# Patient Record
Sex: Female | Born: 1990 | Race: Black or African American | Hispanic: No | Marital: Single | Smoking: Never smoker
Health system: Southern US, Community
[De-identification: ages and names within clinical notes are randomized; demographics above are authoritative.]

## PROBLEM LIST (undated history)

## (undated) DIAGNOSIS — F32A Depression, unspecified: Secondary | ICD-10-CM

## (undated) DIAGNOSIS — R51 Headache: Secondary | ICD-10-CM

## (undated) DIAGNOSIS — I10 Essential (primary) hypertension: Secondary | ICD-10-CM

## (undated) DIAGNOSIS — F329 Major depressive disorder, single episode, unspecified: Secondary | ICD-10-CM

## (undated) DIAGNOSIS — A64 Unspecified sexually transmitted disease: Secondary | ICD-10-CM

## (undated) DIAGNOSIS — F319 Bipolar disorder, unspecified: Secondary | ICD-10-CM

## (undated) DIAGNOSIS — F209 Schizophrenia, unspecified: Secondary | ICD-10-CM

## (undated) HISTORY — PX: BUNIONECTOMY: SHX129

---
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
## 2011-03-25 LAB — TSH: TSH: 1.88

## 2011-03-25 LAB — T4, FREE: Free T4: 1.27

## 2011-03-25 LAB — RPR: RPR Ser Ql: NONREACTIVE

## 2011-03-25 LAB — GC/CHLAMYDIA PROBE AMP, URINE: Chlamydia, Swab/Urine, PCR: NEGATIVE

## 2011-03-25 LAB — GAMMA GT: GGT: 16

## 2012-09-10 ENCOUNTER — Emergency Department (HOSPITAL_COMMUNITY)
Admission: EM | Admit: 2012-09-10 | Discharge: 2012-09-10 | Disposition: A | Discharge status: Home / Self Care | Payer: Self-pay | Attending: Emergency Medicine | Admitting: Emergency Medicine

## 2012-09-10 DIAGNOSIS — M545 Low back pain, unspecified: Secondary | ICD-10-CM | POA: Insufficient documentation

## 2012-09-10 DIAGNOSIS — N644 Mastodynia: Secondary | ICD-10-CM | POA: Insufficient documentation

## 2012-09-10 DIAGNOSIS — D2330 Other benign neoplasm of skin of unspecified part of face: Secondary | ICD-10-CM | POA: Insufficient documentation

## 2012-09-10 DIAGNOSIS — M549 Dorsalgia, unspecified: Secondary | ICD-10-CM

## 2012-09-10 DIAGNOSIS — N61 Mastitis without abscess: Secondary | ICD-10-CM | POA: Insufficient documentation

## 2012-09-10 DIAGNOSIS — D229 Melanocytic nevi, unspecified: Secondary | ICD-10-CM

## 2012-09-10 DIAGNOSIS — L0390 Cellulitis, unspecified: Secondary | ICD-10-CM

## 2012-09-10 MED ORDER — TRAMADOL HCL 50 MG PO TABS: 50.0000 mg | ORAL_TABLET | Freq: Four times a day (QID) | ORAL | Status: DC | PRN

## 2012-09-10 MED ORDER — TRAMADOL HCL 50 MG PO TABS
50.0000 mg | ORAL_TABLET | Freq: Once | ORAL | Status: AC
  Administered 2012-09-10: 50 mg via ORAL
  Filled 2012-09-10: qty 1

## 2012-09-10 MED ORDER — CEPHALEXIN 500 MG PO CAPS: 500.0000 mg | ORAL_CAPSULE | Freq: Four times a day (QID) | ORAL | Status: DC

## 2012-09-10 NOTE — ED Notes (Signed)
Pt states her LMP was about 2 or 3 months ago. States she has irregular periods and doesn't think she is pregnant.

## 2012-09-10 NOTE — ED Notes (Signed)
Pt verbalized understanding of d/c instructions. Pt medicated for pain prior to d/c. Pt ambulatory to d/c window with steady gait.

## 2012-09-10 NOTE — ED Provider Notes (Signed)
History    This chart was scribed for non-physician practitioner working with Raeford Razor, MD by Smitty Pluck, ED scribe. This patient was seen in room WTR7/WTR7 and the patient's care was started at 9:44 PM.   CSN: [id number]  Arrival date & time 09/10/12  2044       Chief Complaint  Patient presents with  . Breast Pain    The history is provided by the patient. No language interpreter was used.   Kathleen [name] is a 22 y.o. female who presents to the Emergency Department complaining of constant, moderate breast pain onset 1 month ago that is gradually worsening. She states the pain is located underneath her breast. She states she has discharge from both nipples when she squeezes her nipples really hard that has been ongoing since onset of breast pain. Pt mentions that she has associated back pain due to her breast increasing in size. [facility] and another one 1 month ago and both were negative. She reports having recurrent facial moles that have been treated by dermatologist in the past. She denies facial pain. Pt denies fever, chills, nausea, vomiting, diarrhea, weakness, cough, SOB and any other pain.   No past medical history on file.  No past surgical history on file.  No family history on file.  History  Substance Use Topics  . Smoking status: Not on file  . Smokeless tobacco: Not on file  . Alcohol Use: Not on file    OB History   No data available      Review of Systems  Constitutional: Negative for fever and chills.  Respiratory: Negative for cough and shortness of breath.   Gastrointestinal: Negative for nausea, vomiting and diarrhea.  Musculoskeletal: Positive for back pain.  Neurological: Negative for weakness.    Allergies  Review of patient's allergies indicates no known allergies.  Home Medications   Current Outpatient Rx  Name  Route  Sig  Dispense  Refill  . aspirin 325 MG tablet   Oral   Take 325 mg  by mouth every 6 (six) hours as needed for pain.         . cephALEXin (KEFLEX) 500 MG capsule   Oral   Take 1 capsule (500 mg total) by mouth 4 (four) times daily.   28 capsule   0   . traMADol (ULTRAM) 50 MG tablet   Oral   Take 1 tablet (50 mg total) by mouth every 6 (six) hours as needed for pain.   15 tablet   0     BP 133/83  Pulse 95  Temp(Src) 98.4 F (36.9 C) (Oral)  Resp 17  SpO2 99%  Physical Exam  Nursing note and vitals reviewed. Constitutional: She is oriented to person, place, and time. She appears well-developed and well-nourished. No distress.  HENT:  Head: Normocephalic and atraumatic.    Eyes: EOM are normal.  Neck: Neck supple. No tracheal deviation present.  Cardiovascular: Normal rate.   Pulmonary/Chest: Effort normal. No respiratory distress.    Musculoskeletal: Normal range of motion.       Lumbar back: She exhibits pain and spasm.       Back:  Neurological: She is alert and oriented to person, place, and time.  Skin: Skin is warm and dry.  Psychiatric: She has a normal mood and affect. Her behavior is normal.    ED Course  Procedures (including critical care time) DIAGNOSTIC STUDIES: Oxygen Saturation is  99% on room air, normal by my interpretation.    COORDINATION OF CARE: 9:48 PM Discussed ED treatment with pt and pt agrees.     Labs Reviewed - No data to display No results found.   1. Back pain   2. Mole of skin   3. Cellulitis   4. Breast pain in female       MDM  Pt has very large breast which she describes as growing again very fast. Due to growing breasts, breat pain and itching, clear discharge patients hormones probably need to be checked. [facility] which is normal and declines a second here today. She has had back pains and breast pains for months now. [facility] with a vaginal yeast infection yesterday but did not tell them about her facial  moles, breast pain/discharge or back pain.   Referral to Dr. Izora Ribas given for evaluation of possible breast reduction Dermatology referral given for facial moles Gynecology referral given for possible hormone imbalance.  Rx. Bactrim for cellulitis and Ultram for pain  Pt has been advised of the symptoms that warrant their return to the ED. Patient has voiced understanding and has agreed to follow-up with the PCP or specialist.  I personally performed the services described in this documentation, which was scribed in my presence. The recorded information has been reviewed and is accurate.    Dorthula Matas, PA-C 09/10/12 2210

## 2012-09-10 NOTE — ED Notes (Signed)
Pt states she has some bumps to her face. Pt has two papules to her face that she has had treated by a dermatologist in the past. Pt states she also has used OTC "freeze-off" to remove the bumps, but they are still there. Pt also c/o pain under both breasts with white discharge from her nipples. Pt states this has been going on for a month. Pt states breasts are heavy and painful. Pt ambulatory to exam room with a steady gait. Pt states she has a ride home.

## 2012-09-15 NOTE — ED Provider Notes (Signed)
Medical screening examination/treatment/procedure(s) were performed by non-physician practitioner and as supervising physician I was immediately available for consultation/collaboration.  Kenasia Scheller, MD 09/15/12 1302 

## 2012-09-24 ENCOUNTER — Encounter (HOSPITAL_COMMUNITY): Payer: Self-pay | Admitting: Emergency Medicine

## 2012-09-24 ENCOUNTER — Emergency Department (HOSPITAL_COMMUNITY): Payer: No Typology Code available for payment source

## 2012-09-24 ENCOUNTER — Emergency Department (HOSPITAL_COMMUNITY)
Admission: EM | Admit: 2012-09-24 | Discharge: 2012-09-24 | Disposition: A | Discharge status: Home / Self Care | Payer: No Typology Code available for payment source | Attending: Emergency Medicine | Admitting: Emergency Medicine

## 2012-09-24 DIAGNOSIS — S139XXA Sprain of joints and ligaments of unspecified parts of neck, initial encounter: Secondary | ICD-10-CM | POA: Insufficient documentation

## 2012-09-24 DIAGNOSIS — S161XXA Strain of muscle, fascia and tendon at neck level, initial encounter: Secondary | ICD-10-CM

## 2012-09-24 DIAGNOSIS — Y9389 Activity, other specified: Secondary | ICD-10-CM | POA: Insufficient documentation

## 2012-09-24 DIAGNOSIS — S335XXA Sprain of ligaments of lumbar spine, initial encounter: Secondary | ICD-10-CM | POA: Insufficient documentation

## 2012-09-24 DIAGNOSIS — Y92410 Unspecified street and highway as the place of occurrence of the external cause: Secondary | ICD-10-CM | POA: Insufficient documentation

## 2012-09-24 DIAGNOSIS — S39012A Strain of muscle, fascia and tendon of lower back, initial encounter: Secondary | ICD-10-CM

## 2012-09-24 MED ORDER — IBUPROFEN 800 MG PO TABS: 800.0000 mg | ORAL_TABLET | Freq: Three times a day (TID) | ORAL | Status: DC | PRN

## 2012-09-24 MED ORDER — HYDROCODONE-ACETAMINOPHEN 5-325 MG PO TABS: 1.0000 | ORAL_TABLET | Freq: Four times a day (QID) | ORAL | Status: DC | PRN
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
## 2012-09-24 NOTE — ED Provider Notes (Signed)
Medical screening examination/treatment/procedure(s) were performed by non-physician practitioner and as supervising physician I was immediately available for consultation/collaboration.  Juliet Rude. Rubin Payor, MD 09/24/12 2142
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
## 2012-10-13 ENCOUNTER — Emergency Department (HOSPITAL_COMMUNITY)
Admission: EM | Admit: 2012-10-13 | Discharge: 2012-10-13 | Disposition: A | Discharge status: Home / Self Care | Payer: Self-pay | Attending: Emergency Medicine | Admitting: Emergency Medicine

## 2012-10-13 ENCOUNTER — Encounter (HOSPITAL_COMMUNITY): Payer: Self-pay | Admitting: *Deleted

## 2012-10-13 DIAGNOSIS — R112 Nausea with vomiting, unspecified: Secondary | ICD-10-CM | POA: Insufficient documentation

## 2012-10-13 DIAGNOSIS — K5289 Other specified noninfective gastroenteritis and colitis: Secondary | ICD-10-CM | POA: Insufficient documentation

## 2012-10-13 DIAGNOSIS — K529 Noninfective gastroenteritis and colitis, unspecified: Secondary | ICD-10-CM

## 2012-10-13 DIAGNOSIS — R109 Unspecified abdominal pain: Secondary | ICD-10-CM | POA: Insufficient documentation

## 2012-10-13 DIAGNOSIS — Z3202 Encounter for pregnancy test, result negative: Secondary | ICD-10-CM | POA: Insufficient documentation

## 2012-10-13 DIAGNOSIS — R197 Diarrhea, unspecified: Secondary | ICD-10-CM | POA: Insufficient documentation

## 2012-10-13 LAB — COMPREHENSIVE METABOLIC PANEL
AST: 29 U/L (ref 0–37)
Albumin: 3.8 g/dL (ref 3.5–5.2)
Calcium: 9.3 mg/dL (ref 8.4–10.5)
Chloride: 102 mEq/L (ref 96–112)
Creatinine, Ser: 0.99 mg/dL (ref 0.50–1.10)
Sodium: 139 mEq/L (ref 135–145)
Total Bilirubin: 0.2 mg/dL — ABNORMAL LOW (ref 0.3–1.2)

## 2012-10-13 LAB — CBC WITH DIFFERENTIAL/PLATELET
Basophils Absolute: 0 10*3/uL (ref 0.0–0.1)
Basophils Relative: 0 % (ref 0–1)
HCT: 41.8 % (ref 36.0–46.0)
MCHC: 33 g/dL (ref 30.0–36.0)
Monocytes Absolute: 0.6 10*3/uL (ref 0.1–1.0)
Neutro Abs: 3.9 10*3/uL (ref 1.7–7.7)
Platelets: 209 10*3/uL (ref 150–400)
RDW: 12.4 % (ref 11.5–15.5)

## 2012-10-13 LAB — URINALYSIS, ROUTINE W REFLEX MICROSCOPIC
Glucose, UA: NEGATIVE mg/dL
Specific Gravity, Urine: 1.032 — ABNORMAL HIGH (ref 1.005–1.030)

## 2012-10-13 LAB — URINE MICROSCOPIC-ADD ON

## 2012-10-13 LAB — POCT PREGNANCY, URINE: Preg Test, Ur: NEGATIVE

## 2012-10-13 MED ORDER — ONDANSETRON HCL 8 MG PO TABS: 8.0000 mg | ORAL_TABLET | Freq: Three times a day (TID) | ORAL | Status: DC | PRN

## 2012-10-13 MED ORDER — ONDANSETRON HCL 4 MG/2ML IJ SOLN
4.0000 mg | Freq: Once | INTRAMUSCULAR | Status: AC
  Administered 2012-10-13: 4 mg via INTRAVENOUS
  Filled 2012-10-13: qty 2

## 2012-10-13 MED ORDER — SODIUM CHLORIDE 0.9 % IV BOLUS (SEPSIS)
1000.0000 mL | Freq: Once | INTRAVENOUS | Status: AC
  Administered 2012-10-13: 1000 mL via INTRAVENOUS

## 2012-10-13 NOTE — ED Provider Notes (Signed)
History     CSN: [id number]  Arrival date & time 10/13/12  1236   First MD Initiated Contact with Patient 10/13/12 1311      Chief Complaint  Patient presents with  . Emesis  . Diarrhea    (Consider location/radiation/quality/duration/timing/severity/associated sxs/prior treatment) Patient is a 22 y.o. female presenting with vomiting and diarrhea. The history is provided by the patient.  Emesis Associated symptoms: diarrhea   Associated symptoms: no headaches   Diarrhea Associated symptoms: vomiting   Associated symptoms: no fever and no headaches   pt with nvd x past 1-2 days. States thinks had started after eating some ice cream, but also states sign other recently ill w same symptoms. Emesis, clear or color of recently ingested liquids, not bloody or bilious. Diarrhea watery. Notes several episodes per day. No bloody stools. Intermittent mid abd crampy pain, no constant or focal abd pain. Urinating regularly, no gu c/o. No back or flank pain. No vaginal bleeding or discharge. States last period a few weeks ago. No recent travel. No recent abx use. No fever or chills. No faintness or dizziness.   History reviewed. No pertinent past medical history.  History reviewed. No pertinent past surgical history.  History reviewed. No pertinent family history.  History  Substance Use Topics  . Smoking status: Not on file  . Smokeless tobacco: Not on file  . Alcohol Use: No    OB History   Grav Para Term Preterm Abortions TAB SAB Ect Mult Living                  Review of Systems  Constitutional: Negative for fever.  HENT: Negative for neck pain.   Eyes: Negative for redness.  Respiratory: Negative for shortness of breath.   Cardiovascular: Negative for chest pain.  Gastrointestinal: Positive for vomiting and diarrhea.  Genitourinary: Negative for dysuria and flank pain.  Musculoskeletal: Negative for back pain.  Skin: Negative for rash.  Neurological: Negative for  headaches.  Hematological: Does not bruise/bleed easily.  Psychiatric/Behavioral: Negative for confusion.    Allergies  Review of patient's allergies indicates no known allergies.  Home Medications  No current outpatient prescriptions on file.  BP 118/80  Pulse 93  Temp(Src) 99.1 F (37.3 C) (Oral)  Resp 23  SpO2 94%  LMP 06/26/2012  Physical Exam  Nursing note and vitals reviewed. Constitutional: She appears well-developed and well-nourished. No distress.  HENT:  Mouth/Throat: Oropharynx is clear and moist.  Eyes: Conjunctivae are normal. No scleral icterus.  Neck: Neck supple. No tracheal deviation present.  Cardiovascular: Normal rate, regular rhythm, normal heart sounds and intact distal pulses.   Pulmonary/Chest: Effort normal and breath sounds normal. No respiratory distress.  Abdominal: Soft. Normal appearance and bowel sounds are normal. She exhibits no distension and no mass. There is no tenderness. There is no rebound and no guarding.  Genitourinary:  No cva tenderness  Musculoskeletal: She exhibits no edema.  Neurological: She is alert.  Skin: Skin is warm and dry. No rash noted.  Psychiatric: She has a normal mood and affect.    ED Course  Procedures (including critical care time)   Results for orders placed during the hospital encounter of 10/13/12  CBC WITH DIFFERENTIAL      Result Value Range   WBC 5.9  4.0 - 10.5 K/uL   RBC 5.19 (*) 3.87 - 5.11 MIL/uL   Hemoglobin 13.8  12.0 - 15.0 g/dL   HCT 62.1  30.8 - 65.7 %  MCV 80.5  78.0 - 100.0 fL   MCH 26.6  26.0 - 34.0 pg   MCHC 33.0  30.0 - 36.0 g/dL   RDW 57.8  46.9 - 62.9 %   Platelets 209  150 - 400 K/uL   Neutrophils Relative 65  43 - 77 %   Neutro Abs 3.9  1.7 - 7.7 K/uL   Lymphocytes Relative 24  12 - 46 %   Lymphs Abs 1.5  0.7 - 4.0 K/uL   Monocytes Relative 10  3 - 12 %   Monocytes Absolute 0.6  0.1 - 1.0 K/uL   Eosinophils Relative 0  0 - 5 %   Eosinophils Absolute 0.0  0.0 - 0.7 K/uL    Basophils Relative 0  0 - 1 %   Basophils Absolute 0.0  0.0 - 0.1 K/uL  COMPREHENSIVE METABOLIC PANEL      Result Value Range   Sodium 139  135 - 145 mEq/L   Potassium 3.6  3.5 - 5.1 mEq/L   Chloride 102  96 - 112 mEq/L   CO2 28  19 - 32 mEq/L   Glucose, Bld 93  70 - 99 mg/dL   BUN 6  6 - 23 mg/dL   Creatinine, Ser 5.28  0.50 - 1.10 mg/dL   Calcium 9.3  8.4 - 41.3 mg/dL   Total Protein 8.0  6.0 - 8.3 g/dL   Albumin 3.8  3.5 - 5.2 g/dL   AST 29  0 - 37 U/L   ALT 32  0 - 35 U/L   Alkaline Phosphatase 74  39 - 117 U/L   Total Bilirubin 0.2 (*) 0.3 - 1.2 mg/dL   GFR calc non Af Amer 80 (*) >90 mL/min   GFR calc Af Amer >90  >90 mL/min  URINALYSIS, ROUTINE W REFLEX MICROSCOPIC      Result Value Range   Color, Urine Verena (*) YELLOW   APPearance CLOUDY (*) CLEAR   Specific Gravity, Urine 1.032 (*) 1.005 - 1.030   pH 6.0  5.0 - 8.0   Glucose, UA NEGATIVE  NEGATIVE mg/dL   Hgb urine dipstick LARGE (*) NEGATIVE   Bilirubin Urine SMALL (*) NEGATIVE   Ketones, ur NEGATIVE  NEGATIVE mg/dL   Protein, ur 30 (*) NEGATIVE mg/dL   Urobilinogen, UA 1.0  0.0 - 1.0 mg/dL   Nitrite NEGATIVE  NEGATIVE   Leukocytes, UA SMALL (*) NEGATIVE  URINE MICROSCOPIC-ADD ON      Result Value Range   Squamous Epithelial / LPF MANY (*) RARE   WBC, UA 3-6  <3 WBC/hpf   RBC / HPF 3-6  <3 RBC/hpf   Bacteria, UA FEW (*) RARE   Urine-Other MUCOUS PRESENT    POCT PREGNANCY, URINE      Result Value Range   Preg Test, Ur NEGATIVE  NEGATIVE       MDM  Iv ns bolus. zofran iv.  Reviewed nursing notes and prior charts for additional history.   Recheck pt, no nv. Tolerating po.   No recurrent nvd while in ed.  abd  Soft nt.   Pt appears stable for d/c.         Suzi Roots, MD 10/13/12 1501

## 2012-10-13 NOTE — ED Notes (Signed)
Pt states "My grandmother thinks I have food poisoning so she talked me into coming here." reports diarrhea and vomiting "every hour on the hour." pt also c/o abdominal pain. reports seeing blood in stool.

## 2012-10-14 LAB — URINE CULTURE: Colony Count: >=100000

## 2012-11-06 ENCOUNTER — Emergency Department (HOSPITAL_COMMUNITY)
Admission: EM | Admit: 2012-11-06 | Discharge: 2012-11-06 | Disposition: A | Discharge status: Other Acute Inpatient Hospital | Payer: Self-pay | Attending: Emergency Medicine | Admitting: Emergency Medicine

## 2012-11-06 ENCOUNTER — Encounter (HOSPITAL_COMMUNITY): Payer: Self-pay

## 2012-11-06 ENCOUNTER — Inpatient Hospital Stay (HOSPITAL_COMMUNITY)
Admission: AD | Admit: 2012-11-06 | Discharge: 2012-11-12 | DRG: 885 | Disposition: A | Discharge status: Home / Self Care | Payer: No Typology Code available for payment source | Source: Intra-hospital | Attending: Psychiatry | Admitting: Psychiatry

## 2012-11-06 DIAGNOSIS — R45850 Homicidal ideations: Secondary | ICD-10-CM

## 2012-11-06 DIAGNOSIS — F3160 Bipolar disorder, current episode mixed, unspecified: Secondary | ICD-10-CM | POA: Diagnosis present

## 2012-11-06 DIAGNOSIS — F329 Major depressive disorder, single episode, unspecified: Secondary | ICD-10-CM

## 2012-11-06 DIAGNOSIS — R45851 Suicidal ideations: Secondary | ICD-10-CM

## 2012-11-06 DIAGNOSIS — Z3202 Encounter for pregnancy test, result negative: Secondary | ICD-10-CM | POA: Insufficient documentation

## 2012-11-06 DIAGNOSIS — Z79899 Other long term (current) drug therapy: Secondary | ICD-10-CM

## 2012-11-06 DIAGNOSIS — F259 Schizoaffective disorder, unspecified: Principal | ICD-10-CM | POA: Diagnosis present

## 2012-11-06 DIAGNOSIS — F319 Bipolar disorder, unspecified: Secondary | ICD-10-CM | POA: Insufficient documentation

## 2012-11-06 HISTORY — DX: Bipolar disorder, unspecified: F31.9

## 2012-11-06 HISTORY — DX: Depression, unspecified: F32.A

## 2012-11-06 HISTORY — DX: Major depressive disorder, single episode, unspecified: F32.9

## 2012-11-06 LAB — RAPID URINE DRUG SCREEN, HOSP PERFORMED
Amphetamines: NOT DETECTED
Opiates: NOT DETECTED
Tetrahydrocannabinol: NOT DETECTED

## 2012-11-06 LAB — POCT PREGNANCY, URINE: Preg Test, Ur: NEGATIVE

## 2012-11-06 LAB — COMPREHENSIVE METABOLIC PANEL
ALT: 22 U/L (ref 0–35)
Albumin: 3.6 g/dL (ref 3.5–5.2)
Alkaline Phosphatase: 87 U/L (ref 39–117)
BUN: 4 mg/dL — ABNORMAL LOW (ref 6–23)
Chloride: 104 mEq/L (ref 96–112)
GFR calc Af Amer: >90 mL/min (ref >90)
Glucose, Bld: 88 mg/dL (ref 70–99)
Potassium: 4.3 mEq/L (ref 3.5–5.1)
Sodium: 138 mEq/L (ref 135–145)
Total Bilirubin: 0.3 mg/dL (ref 0.3–1.2)

## 2012-11-06 LAB — CBC WITH DIFFERENTIAL/PLATELET
Hemoglobin: 13 g/dL (ref 12.0–15.0)
Lymphs Abs: 1.4 10*3/uL (ref 0.7–4.0)
Monocytes Relative: 9 % (ref 3–12)
Neutro Abs: 5.5 10*3/uL (ref 1.7–7.7)
Neutrophils Relative %: 72 % (ref 43–77)
Platelets: 193 10*3/uL (ref 150–400)
RBC: 4.91 MIL/uL (ref 3.87–5.11)
WBC: 7.5 10*3/uL (ref 4.0–10.5)

## 2012-11-06 LAB — ETHANOL: Alcohol, Ethyl (B): <11 mg/dL (ref 0–11)

## 2012-11-06 MED ORDER — ALUM & MAG HYDROXIDE-SIMETH 200-200-20 MG/5ML PO SUSP: 30.0000 mL | ORAL | Status: DC | PRN

## 2012-11-06 MED ORDER — ZOLPIDEM TARTRATE 5 MG PO TABS: 5.0000 mg | ORAL_TABLET | Freq: Every evening | ORAL | Status: DC | PRN

## 2012-11-06 MED ORDER — TRAZODONE HCL 50 MG PO TABS
50.0000 mg | ORAL_TABLET | Freq: Every evening | ORAL | Status: DC | PRN
Start: 2012-11-06 — End: 2012-11-12
  Administered 2012-11-06 – 2012-11-11 (×4): 50 mg via ORAL
  Filled 2012-11-06 (×3): qty 1

## 2012-11-06 MED ORDER — IBUPROFEN 600 MG PO TABS: 600.0000 mg | ORAL_TABLET | Freq: Three times a day (TID) | ORAL | Status: DC | PRN

## 2012-11-06 MED ORDER — MAGNESIUM HYDROXIDE 400 MG/5ML PO SUSP: 30.0000 mL | Freq: Every day | ORAL | Status: DC | PRN

## 2012-11-06 MED ORDER — ACETAMINOPHEN 325 MG PO TABS
650.0000 mg | ORAL_TABLET | Freq: Four times a day (QID) | ORAL | Status: DC | PRN
  Administered 2012-11-06 – 2012-11-07 (×2): 650 mg via ORAL

## 2012-11-06 MED ORDER — ACETAMINOPHEN 325 MG PO TABS
650.0000 mg | ORAL_TABLET | ORAL | Status: DC | PRN
  Administered 2012-11-06: 650 mg via ORAL
  Filled 2012-11-06: qty 2

## 2012-11-06 NOTE — ED Notes (Signed)
ZOX:[id number]<AV> Expected date:11/06/12<BR> Expected time: 2:48 PM<BR> Means of arrival:Ambulance<BR> Comments:<BR> Med Clearance, vag bleeding
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
## 2012-11-06 NOTE — ED Provider Notes (Signed)
History    CSN: [id number] Arrival date & time 11/06/12  1451 First MD Initiated Contact with Patient 11/06/12 1528     Chief Complaint  Patient presents with  . Medical Clearance   HPI Patient presents to the emergency room with complaints of depression and suicidal ideation. Patient has had a history of trouble with depression and bipolar disorder. She has been seen by emergency department personnel in the past. Patient states that she was almost committed to the hospital one time but did not need to be admitted. She was supposed to followup with a psychiatrist as an outpatient but never did so. Her symptoms have been persisting. Over the last 3 months she has felt increasing thoughts of suicide. No specific plan. She also hears voices that tell her to harm people. The patient knows that this is not right and once to get some help before she acts out on any of this.  Patient does have a history of irregular menstrual periods. Her last menstrual period was about 3 months ago. She denies any fevers or vomiting or other complaints. Past Medical History  Diagnosis Date  . Depression   . Bipolar 1 disorder     Past Surgical History  Procedure Laterality Date  . Bunionectomy      both feet    No family history on file.  History  Substance Use Topics  . Smoking status: Not on file  . Smokeless tobacco: Not on file  . Alcohol Use: No    OB History   Grav Para Term Preterm Abortions TAB SAB Ect Mult Living                  Review of Systems  All other systems reviewed and are negative.    Allergies  Review of patient's allergies indicates no known allergies.  Home Medications  No current outpatient prescriptions on file.  BP 113/65  Pulse 78  Temp(Src) 98.9 F (37.2 C) (Oral)  Resp 16  SpO2 99%  LMP 11/06/2012  Physical Exam  Nursing note and vitals reviewed. Constitutional: She appears well-developed and well-nourished. No distress.  HENT:  Head: Normocephalic  and atraumatic.  Right Ear: External ear normal.  Left Ear: External ear normal.  Eyes: Conjunctivae are normal. Right eye exhibits no discharge. Left eye exhibits no discharge. No scleral icterus.  Neck: Neck supple. No tracheal deviation present.  Cardiovascular: Normal rate, regular rhythm and intact distal pulses.   Pulmonary/Chest: Effort normal and breath sounds normal. No stridor. No respiratory distress. She has no wheezes. She has no rales.  Abdominal: Soft. Bowel sounds are normal. She exhibits no distension. There is no tenderness. There is no rebound and no guarding.  Musculoskeletal: She exhibits no edema and no tenderness.  Neurological: She is alert. She has normal strength. No sensory deficit. Cranial nerve deficit:  no gross defecits noted. She exhibits normal muscle tone. She displays no seizure activity. Coordination normal.  Skin: Skin is warm and dry. No rash noted.  Psychiatric: Her speech is normal. Her affect is not labile. She is not agitated, not aggressive, not withdrawn and not combative. Thought content is not delusional. She exhibits a depressed mood. She expresses suicidal ideation. She is attentive.    ED Course  Procedures (including critical care time)  Labs Reviewed  COMPREHENSIVE METABOLIC PANEL - Abnormal; Notable for the following:    BUN 4 (*)    All other components within normal limits  CBC WITH DIFFERENTIAL  URINE RAPID  DRUG SCREEN (HOSP PERFORMED)  ETHANOL  POCT PREGNANCY, URINE    MDM  Patient does mention irregular menstrual periods. She is not pregnant. Her laboratory tests are normal. She has had this in the past. Do not feel that any further emergency evaluation is necessary.  Patient is medically stable. The patient will be transferred to the psychiatric ED for consultation the act team.        Celene Kras, MD 11/06/12 [phone]

## 2012-11-06 NOTE — ED Notes (Signed)
ACT in w/ pt 

## 2012-11-06 NOTE — BHH Counselor (Signed)
Jacquelyne Balint, ACT RN at [facility], submitted Pt for admission to [facility]. Binnie Rail, [facility] confirmed bed availability. Gave clinical report to Kathryne Sharper, MD who accepted Pt to the service of Dr. Cyndia Diver, [phone] with no roommate. Notified Evette Cristal, assessment counselor at [facility], of admission.  Harlin Rain Patsy Baltimore, LPC, [facility] Assessment Counselor

## 2012-11-06 NOTE — ED Notes (Addendum)
Per EMS patient stated that she has been suicidal for about 3 months. Has not been on medication for last 5 years. Yesterday she started having lower abdominal pain which radiates to lower back. Vaginal bleeding started this morning.Bleeding is heavier than normal. Has not had menses in 3 months.acknowledges that her period is not regular.

## 2012-11-06 NOTE — ED Notes (Signed)
Pt aaox3.  Pt asleep upon entry of room.  Pt easily aroused.  Pt deneis AH/HI at this time.  Pt reports "SI never really goes away."  Pt reports "I will try to go back to sleep now because I am tired."  Will continue to monitor.

## 2012-11-06 NOTE — ED Notes (Signed)
Patient states that she is hearing voices and attempted to cut her ankles and wrists. She envisions bashing people in their head and watching them bleed. States that the voices are getting progressively worse and that she is scared. She has some disruption in her support system she does not talk to her parents.

## 2012-11-06 NOTE — ED Notes (Signed)
Charge nurse made aware the need for a sitter. No sitter available. Will monitor patient.

## 2012-11-07 DIAGNOSIS — F3160 Bipolar disorder, current episode mixed, unspecified: Secondary | ICD-10-CM

## 2012-11-07 MED ORDER — ENSURE COMPLETE PO LIQD
237.0000 mL | Freq: Two times a day (BID) | ORAL | Status: DC
  Administered 2012-11-08 – 2012-11-12 (×9): 237 mL via ORAL
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
## 2012-11-07 NOTE — BHH Group Notes (Signed)
BHH Group Notes:  (Clinical Social Work)  11/07/2012   11:15-11:45AM  Summary of Progress/Problems:  The main focus of today's process group was to listen to a variety of genres of music and to identify that different types of music provoke different responses.  The patient then was able to identify personally what was soothing for them, as well as energizing.  Handouts were used to record feelings evoked, as well as how patient can personally use this knowledge in sleep habits, with depression, and with other symptoms.  The patient expressed basic understanding of how the various music types were making her feel.  In answering this question after each song, she frequently seemed confused and had to think before answering.  Type of Therapy:  Music Therapy   Participation Level:  Active  Participation Quality:  Attentive  Affect:  Blunted  Cognitive:  Confused  Insight:  Developing/Improving  Engagement in Therapy:  Developing/Improving  Modes of Intervention:   Activity, Exploration  Ambrose Mantle, LCSW 11/07/2012, 12:45 PM
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
## 2012-11-07 NOTE — ED Provider Notes (Signed)
Pt accepted at [facility], d/c in care of security for transport.  Vida Roller, MD 11/07/12 [phone]

## 2012-11-07 NOTE — Progress Notes (Signed)
Patient admitted voluntarily for reporting she is having auditory and visual hallucinations that have increased during the past 3 months. Patient reports that she has auditory hallucinations telling her to hurt herself, stab other people or bash their heads in until she sees blood. Patient reports visual hallucinations, seeing little people running around. Patient reports that she wanted to come in and get help because she is afraid that she may act upon her thoughts. Patient reports constant si and reports that she has looked up ways to kill herself in the tub by electricuting herself. Patient verbally contracts for safety.  Patient reports that her support is her boyfriend and they were living at a hotel. She reports that she wants to get herself better and start meds if needed. Patient was pleasant and cooperative during admission, snacks and fluid provided. Oriented to unit, safety maintained with 15 min checks.

## 2012-11-07 NOTE — Progress Notes (Signed)
Adult Psychoeducational Group Note  Date:  11/07/2012 Time: 09:15 AM Group Topic/Focus:  Goals Group:   The focus of this group is to help patients establish daily goals to achieve during treatment and discuss how the patient can incorporate goal setting into their daily lives to aide in recovery.  Participation Level:  Minimal  Participation Quality:  Appropriate and Attentive  Affect:  Appropriate, Blunted and Depressed  Cognitive:  Alert and Appropriate  Insight: Appropriate and Improving  Engagement in Group:  Developing/Improving and Limited  Modes of Intervention:  Clarification, Discussion and Education  Additional Comments: Psychoeducational group   Kathleen [name] 11/07/2012, 10:40 AM

## 2012-11-07 NOTE — Progress Notes (Signed)
NUTRITION ASSESSMENT  Patient meets criteria for mild-moderate malnutrition related to chronic illness AEB 5% weight loss during the last 3 weeks and <75% intake for the last 3 weeks.  Pt identified as at risk on the Malnutrition Screen Tool  INTERVENTION: 1. Educated patient on the importance of nutrition and encouraged intake of food and beverages. 2. Discussed weight goals. 3. Supplements: Ensure Complete po BID, each supplement provides 350 kcal and 13 grams of protein.   NUTRITION DIAGNOSIS: Unintentional weight loss related to sub-optimal intake as evidenced by pt report.   Goal: Pt to meet >/= 90% of their estimated nutrition needs.  Monitor:  PO intake  Assessment:  Patient admitted with SI, MDD.  States that appetite has been very poor for the last 3 weeks and was only eating bites.  Eats a little more here because of being watched.  UBW 230 lbs 3 weeks ago decreased to 218 lbs.  5% weight loss.    22 y.o. female  Height: Ht Readings from Last 1 Encounters:  11/06/12 5\' 8"  (1.727 m)    Weight: Wt Readings from Last 1 Encounters:  11/06/12 218 lb (98.884 kg)    Weight Hx: Wt Readings from Last 10 Encounters:  11/06/12 218 lb (98.884 kg)    BMI:  Body mass index is 33.15 kg/(m^2). Pt meets criteria for obesity grade 1 based on current BMI.  Estimated Nutritional Needs: Kcal: 25-30 kcal/kg Protein: > 1 gram protein/kg Fluid: 1 ml/kcal  Diet Order: General Pt is also offered choice of unit snacks mid-morning and mid-afternoon.  Pt is eating as desired.   Lab results and medications reviewed.   Oran Rein, RD, LDN Clinical Inpatient Dietitian Pager:  [phone] Weekend and after hours pager:  [phone]

## 2012-11-07 NOTE — Tx Team (Signed)
Initial Interdisciplinary Treatment Plan  PATIENT STRENGTHS: (choose at least two) Ability for insight Motivation for treatment/growth  PATIENT STRESSORS: Traumatic event   PROBLEM LIST: Problem List/Patient Goals Date to be addressed Date deferred Reason deferred Estimated date of resolution  Auditory & Visual hallucinations  11/06/2012     SI  11/06/2012     Depression                                           DISCHARGE CRITERIA:  Ability to meet basic life and health needs Adequate post-discharge living arrangements Improved stabilization in mood, thinking, and/or behavior Need for constant or close observation no longer present Reduction of life-threatening or endangering symptoms to within safe limits Safe-care adequate arrangements made  PRELIMINARY DISCHARGE PLAN: Placement in alternative living arrangements  PATIENT/FAMIILY INVOLVEMENT: This treatment plan has been presented to and reviewed with the patient, Kathleen [name], and/or family member.  The patient and family have been given the opportunity to ask questions and make suggestions.  Floyce Stakes 11/07/2012, 4:36 AM

## 2012-11-08 DIAGNOSIS — F259 Schizoaffective disorder, unspecified: Secondary | ICD-10-CM | POA: Diagnosis present

## 2012-11-08 MED ORDER — RISPERIDONE 1 MG PO TABS
1.0000 mg | ORAL_TABLET | Freq: Once | ORAL | Status: AC
  Administered 2012-11-08: 1 mg via ORAL
  Filled 2012-11-08: qty 1

## 2012-11-08 MED ORDER — RISPERIDONE 1 MG PO TABS
1.0000 mg | ORAL_TABLET | Freq: Two times a day (BID) | ORAL | Status: DC
  Administered 2012-11-08 – 2012-11-10 (×5): 1 mg via ORAL
  Filled 2012-11-08 (×8): qty 1

## 2012-11-08 NOTE — Progress Notes (Signed)
Recreation Therapy Notes  Date: 05.26.2014        Time: 9:30am Location: [address] Dayroom      Group Topic/Focus: Self Expression  Participation Level: Active  Participation Quality: Appropriate and Supportive  Affect: Euthymic  Cognitive: Oriented   Additional Comments: Activity: Me in 3; Explanation: Patients were asked to identify three characteristics they think represent them and asked to represent these characteristics in words or pictures. Patients were given the option of choosing a genre of music to listen to while completing the task requested. Patients collectively chose R&B music to listen to.   Patient actively participated in group activity. Patient wrote "Crazy, Singing, Smart" on her paper. Patient stated she likes to sing and peers and family at home encourage her to sing. A favorite song of the patients came on the Internet radio station chosen by the group, patient and peer broke out into song. Patient openly spoke about a breakup and how music can make you remember times and places that may be difficult. Patient stated she wanted to delete music from her MP3 Player that reminded her of her ex-boyfriend, but she knew she would hear the songs on the radio, so there was no point. Patient offered positive words of encouragement to peer.   [name] Baleigh Rennaker, LRT/CTRS  Kathleen [name] L 11/08/2012 2:35 PM

## 2012-11-08 NOTE — Progress Notes (Signed)
Patient ID: Kathleen [name], female   DOB: 1991-06-10, 22 y.o.   MRN: [id number] D: Pt. Reports  Voices, "come and go" "thoughts of bashing people in head and hurting self. A: Writer introduced self to client and reviewed med administration times. Pt. Contracts for safety. Staff will monitor q45min for safety. Pt. Encouraged to attend group. R: Pt. Is safe on the unit.
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
## 2012-11-08 NOTE — Progress Notes (Signed)
Patient ID: Kathleen [name], female   DOB: 07/20/1990, 22 y.o.   MRN: [id number] D-Patient reports she requested medication to sleep.  She says her appetite is improving but she still isn't eating well. She thinks she still needs ensure twice daily to supplement intake.  She rates her depression a 10 and her hopelessness a 10.  She reports hearing voices that tell her to hurt herself and others.  She does contract for safety.  She was stated on risperdal today.  A- Talked with patient about med and it's side effects.  R-She hopes that risperdal will help her depression as it eases the voices.  She is complaining about a pain in lower abdomen that is not cramping.  She says that heat pack is easing the pain. Patient is pleasant and cooperative.  She retreats to her room at times, saying this makes the voices easier to tolerate.

## 2012-11-08 NOTE — Progress Notes (Signed)
[facility] MD Progress Note  11/08/2012 9:07 AM Kathleen [name]  MRN:  [id number] Subjective:  "I'm getting worse, I wasn't started on any medication, and the voices are getting worse." Objective: Patient is psychotic, paranoid, and responding to internal stimulation. She is cooperative and pleasant, but is stating she is afraid of herself, as well as others and is isolating in her room. Diagnosis:  Schizoaffective disorder  ADL's:  Impaired  Sleep: Poor  Appetite:  Fair  Suicidal Ideation:  Yes, but can contract for safety on the unit. Homicidal Ideation:  Yes, but can contract for safety on the unit. AEB (as evidenced by):  Psychiatric Specialty Exam: Review of Systems  Constitutional: Negative.  Negative for fever, chills, weight loss, malaise/fatigue and diaphoresis.  HENT: Negative for congestion and sore throat.   Eyes: Negative for blurred vision, double vision and photophobia.  Respiratory: Negative for cough, shortness of breath and wheezing.   Cardiovascular: Negative for chest pain, palpitations and PND.  Gastrointestinal: Negative for heartburn, nausea, vomiting, abdominal pain, diarrhea and constipation.  Musculoskeletal: Negative for myalgias, joint pain and falls.  Neurological: Negative for dizziness, tingling, tremors, sensory change, speech change, focal weakness, seizures, loss of consciousness, weakness and headaches.  Endo/Heme/Allergies: Negative for polydipsia. Does not bruise/bleed easily.  Psychiatric/Behavioral: Positive for depression, suicidal ideas and hallucinations. Negative for memory loss and substance abuse. The patient is nervous/anxious and has insomnia.     Blood pressure 132/81, pulse 97, temperature 97.8 F (36.6 C), temperature source Oral, resp. rate 18, height 5\' 8"  (1.727 m), weight 98.884 kg (218 lb), last menstrual period 11/06/2012.Body mass index is 33.15 kg/(m^2).  General Appearance: Disheveled  Eye Solicitor::  Fair  Speech:  Pressured   Volume:  Decreased  Mood:  Anxious and Depressed  Affect:  Constricted  Thought Process:  Disorganized  Orientation:  NA  Thought Content:  Hallucinations: Auditory Command:  to hurt herself and to hurt others  Suicidal Thoughts:  Yes.  without intent/plan  Homicidal Thoughts:  Yes.  without intent/plan  Memory:  Immediate;   Fair  Judgement:  Intact  Insight:  Lacking  Psychomotor Activity:  Restlessness  Concentration:  Fair  Recall:  Fair  Akathisia:  No  Handed:  Right  AIMS (if indicated):     Assets:  Communication Skills Desire for Improvement  Sleep:  Number of Hours: 4.5   Current Medications: Current Facility-Administered Medications  Medication Dose Route Frequency Provider Last Rate Last Dose  . acetaminophen (TYLENOL) tablet 650 mg  650 mg Oral Q6H PRN Cleotis Nipper, MD   650 mg at 11/07/12 0809  . alum & mag hydroxide-simeth (MAALOX/MYLANTA) 200-200-20 MG/5ML suspension 30 mL  30 mL Oral Q4H PRN Cleotis Nipper, MD      . feeding supplement (ENSURE COMPLETE) liquid 237 mL  237 mL Oral BID BM Jeoffrey Massed, RD      . magnesium hydroxide (MILK OF MAGNESIA) suspension 30 mL  30 mL Oral Daily PRN Cleotis Nipper, MD      . traZODone (DESYREL) tablet 50 mg  50 mg Oral QHS PRN,MR X 1 Cleotis Nipper, MD   50 mg at 11/07/12 2238    Lab Results:  Results for orders placed during the hospital encounter of 11/06/12 (from the past 48 hour(s))  URINE RAPID DRUG SCREEN (HOSP PERFORMED)     Status: None   Collection Time    11/06/12  3:56 PM      Result Value Range  Opiates NONE DETECTED  NONE DETECTED   Cocaine NONE DETECTED  NONE DETECTED   Benzodiazepines NONE DETECTED  NONE DETECTED   Amphetamines NONE DETECTED  NONE DETECTED   Tetrahydrocannabinol NONE DETECTED  NONE DETECTED   Barbiturates NONE DETECTED  NONE DETECTED   Comment:            DRUG SCREEN FOR MEDICAL PURPOSES     ONLY.  IF CONFIRMATION IS NEEDED     FOR ANY PURPOSE, NOTIFY LAB     WITHIN 5 DAYS.                 LOWEST DETECTABLE LIMITS     FOR URINE DRUG SCREEN     Drug Class       Cutoff (ng/mL)     Amphetamine      1000     Barbiturate      200     Benzodiazepine   200     Tricyclics       300     Opiates          300     Cocaine          300     THC              50  CBC WITH DIFFERENTIAL     Status: None   Collection Time    11/06/12  4:06 PM      Result Value Range   WBC 7.5  4.0 - 10.5 K/uL   RBC 4.91  3.87 - 5.11 MIL/uL   Hemoglobin 13.0  12.0 - 15.0 g/dL   HCT 78.2  95.6 - 21.3 %   MCV 80.9  78.0 - 100.0 fL   MCH 26.5  26.0 - 34.0 pg   MCHC 32.7  30.0 - 36.0 g/dL   RDW 08.6  57.8 - 46.9 %   Platelets 193  150 - 400 K/uL   Neutrophils Relative % 72  43 - 77 %   Neutro Abs 5.5  1.7 - 7.7 K/uL   Lymphocytes Relative 18  12 - 46 %   Lymphs Abs 1.4  0.7 - 4.0 K/uL   Monocytes Relative 9  3 - 12 %   Monocytes Absolute 0.7  0.1 - 1.0 K/uL   Eosinophils Relative 1  0 - 5 %   Eosinophils Absolute 0.0  0.0 - 0.7 K/uL   Basophils Relative 0  0 - 1 %   Basophils Absolute 0.0  0.0 - 0.1 K/uL  COMPREHENSIVE METABOLIC PANEL     Status: Abnormal   Collection Time    11/06/12  4:06 PM      Result Value Range   Sodium 138  135 - 145 mEq/L   Potassium 4.3  3.5 - 5.1 mEq/L   Chloride 104  96 - 112 mEq/L   CO2 28  19 - 32 mEq/L   Glucose, Bld 88  70 - 99 mg/dL   BUN 4 (*) 6 - 23 mg/dL   Creatinine, Ser 6.29  0.50 - 1.10 mg/dL   Calcium 9.2  8.4 - 52.8 mg/dL   Total Protein 7.5  6.0 - 8.3 g/dL   Albumin 3.6  3.5 - 5.2 g/dL   AST 23  0 - 37 U/L   ALT 22  0 - 35 U/L   Alkaline Phosphatase 87  39 - 117 U/L   Total Bilirubin 0.3  0.3 - 1.2 mg/dL   GFR calc non Af  Amer >90  >90 mL/min   GFR calc Af Amer >90  >90 mL/min   Comment:            The eGFR has been calculated     using the CKD EPI equation.     This calculation has not been     validated in all clinical     situations.     eGFR's persistently     <90 mL/min signify     possible Chronic Kidney Disease.  ETHANOL      Status: None   Collection Time    11/06/12  4:06 PM      Result Value Range   Alcohol, Ethyl (B) <11  0 - 11 mg/dL   Comment:            LOWEST DETECTABLE LIMIT FOR     SERUM ALCOHOL IS 11 mg/dL     FOR MEDICAL PURPOSES ONLY  POCT PREGNANCY, URINE     Status: None   Collection Time    11/06/12  4:10 PM      Result Value Range   Preg Test, Ur NEGATIVE  NEGATIVE   Comment:            THE SENSITIVITY OF THIS     METHODOLOGY IS >24 mIU/mL    Physical Findings: AIMS: Facial and Oral Movements Muscles of Facial Expression: None, normal Lips and Perioral Area: None, normal Jaw: None, normal Tongue: None, normal,Extremity Movements Upper (arms, wrists, hands, fingers): None, normal Lower (legs, knees, ankles, toes): None, normal, Trunk Movements Neck, shoulders, hips: None, normal, Overall Severity Severity of abnormal movements (highest score from questions above): None, normal Incapacitation due to abnormal movements: None, normal Patient's awareness of abnormal movements (rate only patient's report): No Awareness, Dental Status Current problems with teeth and/or dentures?: No Does patient usually wear dentures?: No  CIWA:    COWS:     Treatment Plan Summary: Daily contact with patient to assess and evaluate symptoms and progress in treatment Medication management  Plan: 1. Continue crisis management and stabilization. 2. Medication management to reduce current symptoms to base line and improve patient's overall level of functioning 3. Treat health problems as indicated. 4. Develop treatment plan to decrease risk of relapse upon discharge and the need for     readmission. 5. Psycho-social education regarding relapse prevention and self care. 6. Health care follow up as needed for medical problems. 7. Continue home medications where appropriate. 8. Will start Risperdal 1mg  po BID for psychosis and agitation 9. ELOS: 4-7 days.   Medical Decision Making Problem Points:   Established problem, worsening (2) Data Points:  Review of new medications or change in dosage (2)  I certify that inpatient services furnished can reasonably be expected to improve the patient's condition.  Rona Ravens. Felishia Wartman RPAC 10:49 AM 11/08/2012

## 2012-11-09 MED ORDER — BENZTROPINE MESYLATE 1 MG PO TABS
1.0000 mg | ORAL_TABLET | Freq: Every day | ORAL | Status: DC
  Administered 2012-11-09 – 2012-11-12 (×4): 1 mg via ORAL
  Filled 2012-11-09 (×2): qty 1
  Filled 2012-11-09 (×2): qty 14
  Filled 2012-11-09 (×3): qty 1

## 2012-11-09 NOTE — BHH Group Notes (Signed)
[facility] LCSW Aftercare Discharge Planning Group Note   11/09/2012 8:14 AM  Participation Quality:  Reluctant  Mood/Affect:  Flat  Depression Rating:  unknown  Anxiety Rating:  unknown  Thoughts of Suicide:  unknown Will you contract for safety?   unknown  Current AVH:  unknown  Plan for Discharge/Comments:  Kathleen [name] made it clear that she did not want to answer any questions in group.  She asked to speak with me individually after group.  Transportation Means: unknown  Supports: unknown  [name], [place] B
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
## 2012-11-09 NOTE — Clinical Social Work Note (Signed)
  Type of Therapy: Process Group Therapy  Participation Level:  Minimal  Participation Quality:  Attentive  Affect:  Flat  Cognitive:  Oriented  Insight:  Limited  Engagement in Group:  Engaged  Engagement in Therapy:  Limited  Modes of Intervention:  Activity, Clarification, Education, Problem-solving and Support  Summary of Progress/Problems: Today's group addressed the issue of overcoming obstacles.  Patients were asked to identify their biggest obstacle post d/c that stands in the way of their on-going success, and then problem solve as to how to manage this. Brynley echoed another patient and stated her biggest obstacle is her family.  She states her plan is to distance herself, and when asked if this was hard or difficult, responded that she has not called them since she has been in the hospital, so "its easy."  She plans to stay in a shelter at d/c.  Acknowledged that she and boyfriend are having a race to see "who can get their act together first."       [name] 11/09/2012   4:38 PM

## 2012-11-09 NOTE — Progress Notes (Signed)
Patient ID: Kathleen [name], female   DOB: [birth year], 22 y.o.   MRN: [id number]  D: Patient cooperative but with minimal interaction. No inappropriate behaviors noted.  A: Q 15 minute safety checks for safety, encourage staff/peer interaction and group participation. Administer medications as ordered by MD. R: No distress noted.

## 2012-11-09 NOTE — Progress Notes (Signed)
Patient ID: Kathleen [name], female   DOB: [birth year], 22 y.o.   MRN: [id number] D- Patient reports poor sleep and improving appetite.  Her energy level is low and her ability to pay attention is improving.  She rate her depression at 7 and hopelessness at 7.  She continues to reports constant thoughts of self harm and continuous voices telling her to hurt herself and others.  A- Asked patient to contract for safety. R- Patient contracts.  She says that she has had the voices so long that she assumes it will take awhile for them to clear.  She is polite and pleasant.  She says that sometimes she needs to go to her room to calm herself and does so.

## 2012-11-09 NOTE — Tx Team (Signed)
  Interdisciplinary Treatment Plan Update   Date Reviewed:  11/09/2012  Time Reviewed:  8:15 AM  Progress in Treatment:   Attending groups: Yes Participating in groups: Yes Taking medication as prescribed: Yes  Tolerating medication: Yes Family/Significant other contact made: No Patient understands diagnosis: Yes As evidenced by asking for help with SI, HI Discussing patient identified problems/goals with staff: Yes Medical problems stabilized or resolved: Yes Denies suicidal/homicidal ideation: No but contracts for safety Patient has not harmed self or others: Yes  For review of initial/current patient goals, please see plan of care.  Estimated Length of Stay:  4-5 days  Reason for Continuation of Hospitalization: Homicidal ideation Medication stabilization Suicidal ideation  New Problems/Goals identified:  N/A  Discharge Plan or Barriers:   unclear  Additional Comments:  Kathleen [name] is an 22 y.o. female was brought to [facility] after she complained of severe abdominal pain. Pt reported that she was in pain, but that was not here real issue. Pt reported that she needed to tell someone that she was having thoughts of killing herself and others, and that she could no longer control her thoughts. Pt reported that she attempted to cut her wrist but the knife was dull. Pt also reported that she was having thoughts of stabbing her boyfriend and busting people in the head so that she could see blood. Pt reported that she was hearing voices that were telling her kill self and others, and that she could no longer control the voices. Pt reported that she just wants to die, and that if she doesn't get help she just may kill herself   Attendees:  Signature: Thedore Mins, MD 11/09/2012 8:15 AM   Signature: Richelle Ito, LCSW 11/09/2012 8:15 AM  Signature: Verne Spurr, PA 11/09/2012 8:15 AM  Signature: Neill Loft, RN 11/09/2012 8:15 AM  Signature: Liborio Nixon, RN 11/09/2012 8:15 AM   Signature:  11/09/2012 8:15 AM  Signature:   11/09/2012 8:15 AM  Signature:    Signature:    Signature:    Signature:    Signature:    Signature:      Scribe for Treatment Team:   Richelle Ito, LCSW  11/09/2012 8:15 AM

## 2012-11-10 MED ORDER — RISPERIDONE 2 MG PO TABS
2.0000 mg | ORAL_TABLET | Freq: Two times a day (BID) | ORAL | Status: DC
  Administered 2012-11-11 – 2012-11-12 (×3): 2 mg via ORAL
  Filled 2012-11-10: qty 1
  Filled 2012-11-10: qty 28
  Filled 2012-11-10: qty 1
  Filled 2012-11-10: qty 28
  Filled 2012-11-10: qty 1
  Filled 2012-11-10 (×2): qty 28
  Filled 2012-11-10 (×3): qty 1

## 2012-11-10 NOTE — Progress Notes (Signed)
D: Patient denies SI/HI and A/V hallucinations; patient reports sleep is fair; reports appetite is good; reports energy level is normal ; reports ability to pay attention is good; rates depression as 2/10; rates hopelessness 2/10; rates anxiety as 0/10; patient has no complaints with this Clinical research associate today; patient heart rate elevated  A: Monitored q 15 minutes; patient encouraged to attend groups; patient educated about medications; patient given medications per physician orders; patient encouraged to express feelings and/or concerns; vital signs reassessed  R: Patient is flat and blunted but cooperative and pleasant; patient's interaction with staff and peers is appropriate;  patient is taking medications as prescribed and tolerating medications; patient is attending all groups and engaging

## 2012-11-10 NOTE — Progress Notes (Signed)
Recreation Therapy Notes  Date: 05.28.2014 Time: 9:30am  Location: [address] Dayroom  Group Topic/Focus: Problem Solving  Participation Level:  Active  Participation Quality:  Appropriate  Affect:  Euthymic  Cognitive:  Appropriate  Additional Comments: Activity: Life Boat ; Explanation: LRT told patients the following scenario: You have charted a yacht for the afternoon, half way through your trip the boat springs a leak. You can saves yourselves as well as 8 of the following people: Darliss Cheney, Laverta [place], Pregnant Woman, Female physician, Ex-Marine, Curator, [place], Ex-Convict, Rabbi, Anthonette [place], Optician, dispensing, Runner, broadcasting/film/video, Investment banker, operational, Engineer, civil (consulting).   Patient participated in group activity. Patient was initially engaged in activity, however approximately half way through patient leaned back in chair and closed eyes. LRT called on patient to get patient to engage, however patient still had minimal interaction throughout the last portion of group. Group agreed on saving the following individuals: Darliss Cheney, Pregnant Woman, Ex-Marine, Mitzi Hansen, Runner, broadcasting/film/video, Chef, and Nurse. Patient gave logical reasoning for saving the individuals on the group list.   Jearl Klinefelter, LRT/CTRS  Jearl Klinefelter 11/10/2012 1:11 PM

## 2012-11-10 NOTE — Progress Notes (Signed)
Patient is tearful and reports that " i keep having bad dreams when i close my eyes and I want to go home"; patient did not forward any more information and just went back to her room; nurse made physician assistant Lloyd Huger aware of this current situation

## 2012-11-10 NOTE — BHH Group Notes (Signed)
[facility] LCSW Aftercare Discharge Planning Group Note   11/10/2012 8:34 AM  Participation Quality:  Engaged  Mood/Affect:  Flat  Depression Rating:  5  Anxiety Rating:  5  Thoughts of Suicide:  No Will you contract for safety?   NA  Current AVH:  Says its "good"  Plan for Discharge/Comments:  Gave her number for shelter in [place] today.  She will call to see about an opening.  Overall says she is doing better due to meds.  Transportation Means: unknown  Supports: boyfriend  [name], [place] B

## 2012-11-10 NOTE — Clinical Social Work Note (Signed)
[facility] Group Therapy  11/10/2012 , 1:21 PM    Type of Therapy:  [facility] [employer] Presentation  Participation Level:  Minimal  Participation Quality:  Attentive  Affect:  Blunted  Cognitive:  Oriented  Insight:  Limited  Engagement in Therapy:  Limited  Modes of Intervention:  Discussion, Education and Socialization  Summary of Progress/Problems:  Kathleen [name] from [facility] [employer] came to present his recovery story and play the guitar.  Kathleen [name] was distracted during the presentation, but was not disruptive.  She enjoyed the guitar playing, and thanked the presenter for coming.  [name] 11/10/2012 , 1:21 PM

## 2012-11-10 NOTE — Progress Notes (Signed)
Patient ID: Kathleen [name], female   DOB: 1990/09/03, 22 y.o.   MRN: [id number] [facility] MD Progress Note  11/10/2012 6:05 PM Kathleen [name]  MRN:  [id number] Subjective: "I'm better today, my BF and I are hoping I will be out of here by Friday so we can go apartment hunting together."  Objective: Patient is seen 1:1 today, voices no new complaints and is tolerating the medication well.  Diagnosis:  Schizoaffective disorder  ADL's:  Impaired  Sleep: good  Appetite:  Fair  Suicidal Ideation:  Yes, but can contract for safety on the unit. Homicidal Ideation:  Yes, but can contract for safety on the unit. States her father is the target. AEB (as evidenced by): Observation, patient's report of improved sleep, mood and appetite.  Psychiatric Specialty Exam: Review of Systems  Constitutional: Negative.  Negative for fever, chills, weight loss, malaise/fatigue and diaphoresis.  HENT: Negative for congestion and sore throat.   Eyes: Negative for blurred vision, double vision and photophobia.  Respiratory: Negative for cough, shortness of breath and wheezing.   Cardiovascular: Negative for chest pain, palpitations and PND.  Gastrointestinal: Negative for heartburn, nausea, vomiting, abdominal pain, diarrhea and constipation.  Musculoskeletal: Negative for myalgias, joint pain and falls.  Neurological: Negative for dizziness, tingling, tremors, sensory change, speech change, focal weakness, seizures, loss of consciousness, weakness and headaches.  Endo/Heme/Allergies: Negative for polydipsia. Does not bruise/bleed easily.  Psychiatric/Behavioral: Positive for depression, suicidal ideas and hallucinations. Negative for memory loss and substance abuse. The patient is nervous/anxious and has insomnia.     Blood pressure 123/86, pulse 100, temperature 97.2 F (36.2 C), temperature source Oral, resp. rate 20, height 5\' 8"  (1.727 m), weight 98.884 kg (218 lb), last menstrual period  11/06/2012.Body mass index is 33.15 kg/(m^2).  General Appearance: Disheveled  Eye Solicitor::  Fair  Speech:  Pressured  Volume:  Decreased  Mood:  Anxious and Depressed  Affect:  Constricted  Thought Process:  Disorganized  Orientation:  NA  Thought Content:  Hallucinations: Auditory Command:  to hurt herself and to hurt others  Suicidal Thoughts:  Yes.  without intent/plan  Homicidal Thoughts:  Yes.  without intent/plan  Memory:  Immediate;   Fair  Judgement:  Intact  Insight:  Lacking  Psychomotor Activity:  Restlessness  Concentration:  Fair  Recall:  Fair  Akathisia:  No  Handed:  Right  AIMS (if indicated):     Assets:  Communication Skills Desire for Improvement  Sleep:  Number of Hours: 6.75   Current Medications: Current Facility-Administered Medications  Medication Dose Route Frequency Provider Last Rate Last Dose  . acetaminophen (TYLENOL) tablet 650 mg  650 mg Oral Q6H PRN Cleotis Nipper, MD   650 mg at 11/07/12 0809  . alum & mag hydroxide-simeth (MAALOX/MYLANTA) 200-200-20 MG/5ML suspension 30 mL  30 mL Oral Q4H PRN Cleotis Nipper, MD      . benztropine (COGENTIN) tablet 1 mg  1 mg Oral Daily Verne Spurr, PA-C   1 mg at 11/10/12 0813  . feeding supplement (ENSURE COMPLETE) liquid 237 mL  237 mL Oral BID BM Jeoffrey Massed, RD   237 mL at 11/10/12 1050  . magnesium hydroxide (MILK OF MAGNESIA) suspension 30 mL  30 mL Oral Daily PRN Cleotis Nipper, MD      . risperiDONE (RISPERDAL) tablet 1 mg  1 mg Oral BID Verne Spurr, PA-C   1 mg at 11/10/12 1700  . traZODone (DESYREL) tablet 50 mg  50  mg Oral QHS PRN,MR X 1 Cleotis Nipper, MD   50 mg at 11/07/12 2238    Lab Results:  No results found for this or any previous visit (from the past 48 hour(s)).  Physical Findings: AIMS: Facial and Oral Movements Muscles of Facial Expression: None, normal Lips and Perioral Area: None, normal Jaw: None, normal Tongue: None, normal,Extremity Movements Upper (arms, wrists, hands,  fingers): None, normal Lower (legs, knees, ankles, toes): None, normal, Trunk Movements Neck, shoulders, hips: None, normal, Overall Severity Severity of abnormal movements (highest score from questions above): None, normal Incapacitation due to abnormal movements: None, normal Patient's awareness of abnormal movements (rate only patient's report): No Awareness, Dental Status Current problems with teeth and/or dentures?: No Does patient usually wear dentures?: No  CIWA:    COWS:     Treatment Plan Summary: Daily contact with patient to assess and evaluate symptoms and progress in treatment Medication management  Plan: 1. Continue crisis management and stabilization. 2. Medication management to reduce current symptoms to base line and improve patient's overall level of functioning 3. Treat health problems as indicated. 4. Develop treatment plan to decrease risk of relapse upon discharge and the need for     readmission. 5. Psycho-social education regarding relapse prevention and self care. 6. Health care follow up as needed for medical problems. 7. Continue home medications where appropriate. 8. Will Increase Risperdal 2 mg po BID for psychosis and agitation 9. ELOS: 4-7 days. 10. Will add cogentin to cover for any EPS.  Medical Decision Making Problem Points:  Established problem, worsening (2) Data Points:  Review of new medications or change in dosage (2)  I certify that inpatient services furnished can reasonably be expected to improve the patient's condition.  Rona Ravens. Amorette Charrette RPAC 6:05 PM 11/10/2012

## 2012-11-10 NOTE — Progress Notes (Signed)
At the beginning of the shift, pt was in the bed dozing.  She responded with writer went in to introduce self.  She also attended evening group, but MHT reported pt was irritable and rude during group.  Pt was polite with Clinical research associate later, requesting ginger ale d/t feeling nausea.  She said she didn't want to be a bother.  Assured pt that we were here to take care of her needs.  Pt denies SI/HI/AV.  She says she is looking forward to being discharged on Friday.  She plans to continue her medications and look for a place to live with her boyfriend.  Support and encouragement offered.  Safety maintained with q15 minute checks.

## 2012-11-10 NOTE — Progress Notes (Signed)
The focus of this group is to help patients review their daily goal of treatment and discuss progress on daily workbooks. Pt attended the evening group and reluctantly responded to all discussion prompts. When the Writer entered the dayroom to start group, Pt began hurriedly talking "My name is Hospital doctor, my day was fine, are we done with group now?" Pt carried this bad attitude throughout the group session and appeared bored. Still, Pt reported having a good day because she was able to catch up on her rest.

## 2012-11-11 NOTE — Clinical Social Work Note (Signed)
[facility] Group Notes:  (Counselor/Nursing/MHT/Case Management/Adjunct)  04/29/2012 2:20 PM  Type of Therapy:  Group Therapy  Participation Level:  Active  Participation Quality:  Attentive  Affect:  Flat  Cognitive:  Appropriate  Insight:  Improving  Engagement in Group:  Engaged  Engagement in Therapy:  Limited  Modes of Intervention:  Discussion, Exploration and Socialization  Summary of Progress/Problems: .balance: The topic for group was balance in life.  Pt participated in the discussion about when their life was in balance and out of balance and how this feels.  Pt discussed ways to get back in balance and short term goals they can work on to get where they want to be. Kathleen [name] states she is balanced today.She named several things that help her get from unbalanced to balanced, after making it clear that her family is not one of them.  Listening to music, staying on medication and following up with a psychiatrist are all things that help.   [name] 04/29/2012, 2:20 PM

## 2012-11-11 NOTE — Progress Notes (Signed)
Patient ID: [name], female   DOB: [birth year], 22 y.o.   MRN: [id number] [facility] MD Progress Note  11/11/2012 10:16 AM Joselin Jaynee Winters  MRN:  [id number] Subjective: "I am feeling much better today".  Objective: Patient reports decreased auditory hallucinations, anxiety and depressive symptoms. She is compliant with her medications and has not reported any adverse reactions. She is looking forward to going home soon.  Diagnosis:  Schizoaffective disorder  ADL's:  Intact  Sleep: good  Appetite:  Fair  Suicidal Ideation: denies Homicidal Ideation: denies  AEB (as evidenced by): Observation, patient's report of improved sleep, mood and appetite.  Psychiatric Specialty Exam: Review of Systems  Constitutional: Negative.  Negative for fever, chills, weight loss, malaise/fatigue and diaphoresis.  HENT: Negative for congestion and sore throat.   Eyes: Negative for blurred vision, double vision and photophobia.  Respiratory: Negative for cough, shortness of breath and wheezing.   Cardiovascular: Negative for chest pain, palpitations and PND.  Gastrointestinal: Negative for heartburn, nausea, vomiting, abdominal pain, diarrhea and constipation.  Musculoskeletal: Negative for myalgias, joint pain and falls.  Neurological: Negative for dizziness, tingling, tremors, sensory change, speech change, focal weakness, seizures, loss of consciousness, weakness and headaches.  Endo/Heme/Allergies: Negative for polydipsia. Does not bruise/bleed easily.  Psychiatric/Behavioral: Positive for depression. Negative for memory loss and substance abuse. The patient is nervous/anxious.     Blood pressure 138/94, pulse 96, temperature 97.5 F (36.4 C), temperature source Oral, resp. rate 16, height 5\' 8"  (1.727 m), weight 98.884 kg (218 lb), last menstrual period 11/06/2012.Body mass index is 33.15 kg/(m^2).  General Appearance: fairly groomed  Patent attorney::  Fair  Speech:  normal  Volume:  normal   Mood:  Less anxiou  Affect:  restricted  Thought Process:  linear  Orientation:  X 3  Thought Content: reality based  Suicidal Thoughts:  no  Homicidal Thoughts:  no  Memory:  Immediate;   Fair  Judgement:  Intact  Insight:  marginal  Psychomotor Activity:  Restlessness  Concentration:  Fair  Recall:  Fair  Akathisia:  No  Handed:  Right  AIMS (if indicated):     Assets:  Communication Skills Desire for Improvement  Sleep:  Number of Hours: 6.75   Current Medications: Current Facility-Administered Medications  Medication Dose Route Frequency Provider Last Rate Last Dose  . acetaminophen (TYLENOL) tablet 650 mg  650 mg Oral Q6H PRN Cleotis Nipper, MD   650 mg at 11/07/12 0809  . alum & mag hydroxide-simeth (MAALOX/MYLANTA) 200-200-20 MG/5ML suspension 30 mL  30 mL Oral Q4H PRN Cleotis Nipper, MD      . benztropine (COGENTIN) tablet 1 mg  1 mg Oral Daily Verne Spurr, PA-C   1 mg at 11/10/12 0813  . feeding supplement (ENSURE COMPLETE) liquid 237 mL  237 mL Oral BID BM Jeoffrey Massed, RD   237 mL at 11/10/12 1050  . magnesium hydroxide (MILK OF MAGNESIA) suspension 30 mL  30 mL Oral Daily PRN Cleotis Nipper, MD      . risperiDONE (RISPERDAL) tablet 1 mg  1 mg Oral BID Verne Spurr, PA-C   1 mg at 11/10/12 1700  . traZODone (DESYREL) tablet 50 mg  50 mg Oral QHS PRN,MR X 1 Cleotis Nipper, MD   50 mg at 11/07/12 2238    Lab Results:  No results found for this or any previous visit (from the past 48 hour(s)).  Physical Findings: AIMS: Facial and Oral Movements Muscles of Facial  Expression: None, normal Lips and Perioral Area: None, normal Jaw: None, normal Tongue: None, normal,Extremity Movements Upper (arms, wrists, hands, fingers): None, normal Lower (legs, knees, ankles, toes): None, normal, Trunk Movements Neck, shoulders, hips: None, normal, Overall Severity Severity of abnormal movements (highest score from questions above): None, normal Incapacitation due to abnormal  movements: None, normal Patient's awareness of abnormal movements (rate only patient's report): No Awareness, Dental Status Current problems with teeth and/or dentures?: No Does patient usually wear dentures?: No  CIWA:    COWS:     Treatment Plan Summary: Daily contact with patient to assess and evaluate symptoms and progress in treatment Medication management  Plan: 1. Continue crisis management and stabilization. 2. Medication management to reduce current symptoms to base line and improve patient's overall level of functioning 3. Treat health problems as indicated. 4. Develop treatment plan to decrease risk of relapse upon discharge and the need for     readmission. 5. Psycho-social education regarding relapse prevention and self care. 6. Health care follow up as needed for medical problems. 7. Continue home medications where appropriate. 8. Will continue Risperdal 2 mg po BID for psychosis and agitation 9. ELOS: 2-3 days. 10. Will add cogentin to cover for any EPS.  Medical Decision Making Problem Points:  Established problem, improving (2) Data Points:  Review of new medications or change in dosage (2)  I certify that inpatient services furnished can reasonably be expected to improve the patient's condition.  Thedore Mins, MD 10:16 AM 11/11/2012

## 2012-11-11 NOTE — Progress Notes (Signed)
Patient ID: Kathleen [name], female   DOB: February 11, 1991, 22 y.o.   MRN: [id number]  D: Pt denies SI/HI/AVH and denies pain at this time. Pt is pleasant and cooperative. Pt states she feels good, she will be going home soon. Pt plans to go somewhere with BF "he's planned a surprise". Pt states when she is on the outside she gets to where she "want's to bash peoples heads in sometimes, this time I really felt like I might act on it, so I decided to come here". Pt states " I will try to stay on my medication, and talk to someone before I act on some feelings".   A: Pt was offered support and encouragement. Pt was given scheduled medications. Pt was encourage to attend groups. Q 15 minute checks were done for safety.   R:Pt attends groups and interacts well with peers and staff. Pt is taking medication. Pt has no complaints at this time.Pt receptive to treatment and safety maintained on unit.

## 2012-11-11 NOTE — [facility] Group Notes (Signed)
[facility] LCSW Aftercare Discharge Planning Group Note   11/11/2012 9:39 AM  Participation Quality:  Minimal  Mood/Affect:  Flat  Depression Rating:  3  Anxiety Rating:  3  Thoughts of Suicide:  No Will you contract for safety?   NA  Current AVH:  No  Plan for Discharge/Comments:  Mckinsley plans to stay with cousin temporarily while she and boyfriend look for an apartment.  She stated she plans to continue with meds, and follow up at [facility].  Likely d/c tomorrow.  Transportation Means: bus  Supports: cousin, boyfriend  [place], [place] B

## 2012-11-11 NOTE — [facility] Suicide Risk Assessment (Signed)
[facility] INPATIENT:  Family/Significant Other Suicide Prevention Education  Suicide Prevention Education:  Education Completed; Kathleen [name] has been identified by the patient as the family member/significant other with whom the patient will be residing, and identified as the person(s) who will aid the patient in the event of a mental health crisis (suicidal ideations/suicide attempt).  With written consent from the patient, the family member/significant other has been provided the following suicide prevention education, prior to the and/or following the discharge of the patient.  The suicide prevention education provided includes the following:  Suicide risk factors  Suicide prevention and interventions  National Suicide Hotline telephone number  [facility] assessment telephone number  [facility] Emergency Assistance 911  [facility] and/or Residential Mobile Crisis Unit telephone number  Request made of family/significant other to:  Remove weapons (e.g., guns, rifles, knives), all items previously/currently identified as safety concern.    Remove drugs/medications (over-the-counter, prescriptions, illicit drugs), all items previously/currently identified as a safety concern.  The family member/significant other verbalizes understanding of the suicide prevention education information provided.  The family member/significant other agrees to remove the items of safety concern listed above. He states she has access to no guns.  [name] 11/11/2012, 3:53 PM

## 2012-11-11 NOTE — Progress Notes (Signed)
D:  Patient's self inventory sheet, patient needs sleep meds, has improving appetite, normal energy level, good attention.  Rated depression #2, denied hopelessness.  Denied withdrawals.  Denied physical problems.  After discharge, plans to do what is best and stay on her meds.  No questions for staff.  Denied SI and HI.   Denied A/V hallucinations.  Does have discharge plans.  No problems taking meds after discharge. A:  Medications administered per MD orders.  Emotional support and encouragement given to patient. R:  Denied SI and HI.  Denied A/V hallucinations. Will continue to monitor for safety with 15 minute checks.  Safety maintained.

## 2012-11-12 MED ORDER — BENZTROPINE MESYLATE 1 MG PO TABS: 1.0000 mg | ORAL_TABLET | Freq: Every day | ORAL | Status: DC

## 2012-11-12 MED ORDER — RISPERIDONE 2 MG PO TABS: 2.0000 mg | ORAL_TABLET | Freq: Two times a day (BID) | ORAL | Status: DC

## 2012-11-12 NOTE — Progress Notes (Signed)
Recreation Therapy Notes  Date: 05.30.2014 Time: 9:30am Location: [address] Dayroom      Group Topic/Focus: Communication  Participation Level: Active  Participation Quality: Appropriate  Affect: Euthymic  Cognitive: Appropriate   Additional Comments: Activity: Random Words ; Explanation: Patients were asked to select a random word from container and tell a personal story relating to that word. Patients were then asked to identify if they were able to relate to peers stories.   Patient actively participated in group activity. Patient shared with group that she is related to Cephus Slater #50 for the [facility]. Patient participated in discussion about using communication to relate to others and build a support system post d/c. Patient spoke about anticipated d/c today. Patient stated she was going to call her boyfriend and then spend some time with family.   [name] Agron Swiney, LRT/CTRS  Jearl Klinefelter 11/12/2012 2:41 PM

## 2012-11-12 NOTE — Progress Notes (Signed)
Patient ID: Kathleen [name], female   DOB: 1991/05/05, 22 y.o.   MRN: [id number] Patient discharged per physician order; patient denies SI/HI and A/V hallucinations; patient given samples, prescriptions, copy of AVS, and bus tickets after it was reviewed; patient had no other questions or concerns at this time; patient left the unit ambulatory; patient signed and verbalized that she received all her belongings

## 2012-11-12 NOTE — [facility] Suicide Risk Assessment (Signed)
Suicide Risk Assessment  Discharge Assessment     Demographic Factors:  Adolescent or young adult, Low socioeconomic status and Unemployed  Mental Status Per Nursing Assessment::   On Admission:  Suicidal ideation indicated by patient;Self-harm thoughts;Self-harm behaviors;Thoughts of violence towards others  Current Mental Status by Physician: patient denies suicidal ideation, intent or plan  Loss Factors: Decrease in vocational status and Financial problems/change in socioeconomic status  Historical Factors: Impulsivity  Risk Reduction Factors:   Sense of responsibility to family and Living with another person, especially a relative  Continued Clinical Symptoms:  Resolving mood symptoms  Cognitive Features That Contribute To Risk:  Closed-mindedness    Suicide Risk:  Minimal: No identifiable suicidal ideation.  Patients presenting with no risk factors but with morbid ruminations; may be classified as minimal risk based on the severity of the depressive symptoms  Discharge Diagnoses:   AXIS I:  Schizoaffective disorder  AXIS II:  Deferred AXIS III:   Past Medical History  Diagnosis Date  . Depression   . Bipolar 1 disorder    AXIS IV:  other psychosocial or environmental problems and problems related to social environment AXIS V:  61-70 mild symptoms  Plan Of Care/Follow-up recommendations:  Activity:  as tolerated Diet:  heathy Tests:  routine blood test Other:  patient to keep her after care appointment  Is patient on multiple antipsychotic therapies at discharge:  No   Has Patient had three or more failed trials of antipsychotic monotherapy by history:  No  Recommended Plan for Multiple Antipsychotic Therapies: N/A  Firmin Belisle,MD 11/12/2012, 9:47 AM

## 2012-11-12 NOTE — Discharge Summary (Signed)
Physician Discharge Summary Note  Patient:  Kathleen [name] is an 22 y.o., female MRN:  [id number] DOB:  1990-10-16 Patient phone:  [phone] (home)  Patient address:   [address] [place] [address],   Date of Admission:  11/06/2012 Date of Discharge: 11/12/2012  Reason for Admission:  Schizoaffective Disorder  Discharge Diagnoses: Principal Problem:   Schizoaffective disorder  Review of Systems  Constitutional: Negative.  Negative for fever, chills, weight loss, malaise/fatigue and diaphoresis.  HENT: Negative for congestion and sore throat.   Eyes: Negative for blurred vision, double vision and photophobia.  Respiratory: Negative for cough, shortness of breath and wheezing.   Cardiovascular: Negative for chest pain, palpitations and PND.  Gastrointestinal: Negative for heartburn, nausea, vomiting, abdominal pain, diarrhea and constipation.  Musculoskeletal: Negative for myalgias, joint pain and falls.  Neurological: Negative for dizziness, tingling, tremors, sensory change, speech change, focal weakness, seizures, loss of consciousness, weakness and headaches.  Endo/Heme/Allergies: Negative for polydipsia. Does not bruise/bleed easily.  Psychiatric/Behavioral: Negative for depression, suicidal ideas, hallucinations, memory loss and substance abuse. The patient is not nervous/anxious and does not have insomnia.   Discharge Diagnoses:  AXIS I: Schizoaffective disorder  AXIS II: Deferred  AXIS III:  Past Medical History   Diagnosis  Date   .  Depression    .  Bipolar 1 disorder     AXIS IV: other psychosocial or environmental problems and problems related to social environment  AXIS V: 61-70 mild symptoms   Level of Care:  OP  Hospital Course:  [name] was admitted to the hospital after presenting to the [facility] reporting suicidal ideation and homicidal ideation towards her father for putting her out of the house.  She has a history of bipolar disorder with one previous  admission to [facility] as an adolescent. [name] was given medication but failed to follow up.   In the ED she was evaluated, and given medical clearance and transferred to [facility] for further care and stabilization. Her labs were unremarkable.      Upon admission to the unit, [name]'s symptoms were anhedonia, psychomotor agitation, fatigue, feelings of worthlessness and guilt,difficulty concentrating. She also had pressured speech, suicidal and homicidal ideation towards her father, with thoughts of just bashing people's heads in, anxiety, and insomnia.  She was started on Risperdal and titrated to a dose of 2mg  and cogentin 1mg  for side effects. She was encouraged to start attending unit programming and to work closely with the CM to plan care for follow up on discharge.     [name] showed signs of improvement quickly and had no side effects. She was evaluated daily by clinical providers to assess her response to treatment. Improvement was noted by decreasing symptoms, affect, improved sleep and mood.     [name] was a pleasant patient on the unit, was not a behavioral problem, and did not need 1:1 observation.  On the day of discharge she was alert and oriented, denied SI/HI and stated she had no AVH. [name] was in much improved condition than upon arrival and felt that her admission had helped her tremendously. She was felt to be stable for discharge with the plan to follow up as noted below. Consults:  None  Significant Diagnostic Studies:  labs: CBC, CMP, UA, UDS  Discharge Vitals:   Blood pressure 112/68, pulse 98, temperature 98 F (36.7 C), temperature source Oral, resp. rate 16, height 5\' 8"  (1.727 m), weight 98.884 kg (218 lb), last menstrual period 11/06/2012. Body mass index is  33.15 kg/(m^2). Lab Results:   No results found for this or any previous visit (from the past 72 hour(s)).  Physical Findings: AIMS: Facial and Oral Movements Muscles of Facial Expression: None, normal Lips and Perioral Area:  None, normal Jaw: None, normal Tongue: None, normal,Extremity Movements Upper (arms, wrists, hands, fingers): None, normal Lower (legs, knees, ankles, toes): None, normal, Trunk Movements Neck, shoulders, hips: None, normal, Overall Severity Severity of abnormal movements (highest score from questions above): None, normal Incapacitation due to abnormal movements: None, normal Patient's awareness of abnormal movements (rate only patient's report): No Awareness, Dental Status Current problems with teeth and/or dentures?: No Does patient usually wear dentures?: No  CIWA:  CIWA-Ar Total: 1 COWS:  COWS Total Score: 2  Psychiatric Specialty Exam: See Psychiatric Specialty Exam and Suicide Risk Assessment completed by Attending Physician prior to discharge.  Discharge destination:  Home  Is patient on multiple antipsychotic therapies at discharge:  No   Has Patient had three or more failed trials of antipsychotic monotherapy by history:  No  Recommended Plan for Multiple Antipsychotic Therapies: Not applicable   Discharge Orders   Future Orders Complete By Expires     Diet - low sodium heart healthy  As directed     Discharge instructions  As directed     Comments:      Take all of your medications as directed. Be sure to keep all of your follow up appointments.  If you are unable to keep your follow up appointment, call your Doctor's office to let them know, and reschedule.  Make sure that you have enough medication to last until your appointment. Be sure to get plenty of rest. Going to bed at the same time each night will help. Try to avoid sleeping during the day.  Increase your activity as tolerated. Regular exercise will help you to sleep better and improve your mental health. Eating a heart healthy diet is recommended. Try to avoid salty or fried foods. Be sure to avoid all alcohol and illegal drugs.    Increase activity slowly  As directed         Medication List    TAKE these  medications     Indication   benztropine 1 MG tablet  Commonly known as:  COGENTIN  Take 1 tablet (1 mg total) by mouth daily. To prevent side effects.   Indication:  Extrapyramidal Reaction caused by Medications     risperiDONE 2 MG tablet  Commonly known as:  RISPERDAL  Take 1 tablet (2 mg total) by mouth 2 (two) times daily. For bipolar mania and psychosis.   Indication:  Manic-Depression           Follow-up Information   Follow up with [facility]. (Go to the walk in clinic M-F between 8 and 9AM for your hospital follow up appointment.  If you need help in the meantime, you can call [facility] at [phone].)    Contact information:   [address]  [place]  [phone]      Follow-up recommendations:   Activities: Resume activity as tolerated. Diet: Heart healthy low sodium diet Tests: Follow up testing will be determined by your out patient provider. Comments:    Total Discharge Time:  Greater than 30 minutes.  Signed: Ida Milbrath 11/12/2012, 9:42 AM

## 2012-11-15 NOTE — Progress Notes (Signed)
Patient Discharge Instructions:  After Visit Summary (AVS):   Faxed to:  11/15/12 Psychiatric Admission Assessment Note:   Faxed to:  11/15/12 Suicide Risk Assessment - Discharge Assessment:   Faxed to:  11/15/12 Faxed/Sent to the Next Level Care provider:  11/15/12 Faxed to [facility] @ [phone]  Jerelene Redden, 11/15/2012, 4:00 PM

## 2012-11-17 NOTE — Discharge Summary (Signed)
Seen and agreed. Jayzen Paver, MD 

## 2012-12-09 ENCOUNTER — Emergency Department (HOSPITAL_COMMUNITY)
Admission: EM | Admit: 2012-12-09 | Discharge: 2012-12-10 | Disposition: A | Discharge status: Other Acute Inpatient Hospital | Payer: No Typology Code available for payment source | Attending: Emergency Medicine | Admitting: Emergency Medicine

## 2012-12-09 ENCOUNTER — Encounter (HOSPITAL_COMMUNITY): Payer: Self-pay | Admitting: Emergency Medicine

## 2012-12-09 DIAGNOSIS — Z7982 Long term (current) use of aspirin: Secondary | ICD-10-CM | POA: Insufficient documentation

## 2012-12-09 DIAGNOSIS — Z3202 Encounter for pregnancy test, result negative: Secondary | ICD-10-CM | POA: Insufficient documentation

## 2012-12-09 DIAGNOSIS — R45851 Suicidal ideations: Secondary | ICD-10-CM | POA: Insufficient documentation

## 2012-12-09 DIAGNOSIS — F319 Bipolar disorder, unspecified: Secondary | ICD-10-CM | POA: Insufficient documentation

## 2012-12-09 DIAGNOSIS — Z79899 Other long term (current) drug therapy: Secondary | ICD-10-CM | POA: Insufficient documentation

## 2012-12-09 DIAGNOSIS — F3112 Bipolar disorder, current episode manic without psychotic features, moderate: Secondary | ICD-10-CM

## 2012-12-09 DIAGNOSIS — F22 Delusional disorders: Secondary | ICD-10-CM | POA: Insufficient documentation

## 2012-12-09 NOTE — ED Notes (Signed)
Pt states she has also been unable to hold her urine and when she moves she wets on herself

## 2012-12-09 NOTE — ED Notes (Signed)
Pt states she was brought in by EMS  Pt states she had them bring her here because she has not been herself  Pt states she was at [facility] last month and they told her if she started to feel like this again to return  Pt states she is currently homeless as her family put her out   Pt denies SI or HI at this time
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
## 2012-12-10 ENCOUNTER — Inpatient Hospital Stay (HOSPITAL_COMMUNITY)
Admission: AD | Admit: 2012-12-10 | Discharge: 2012-12-16 | DRG: 885 | Disposition: A | Discharge status: Home / Self Care | Payer: Federal, State, Local not specified - Other | Source: Intra-hospital | Attending: Psychiatry | Admitting: Psychiatry

## 2012-12-10 ENCOUNTER — Encounter (HOSPITAL_COMMUNITY): Payer: Self-pay | Admitting: *Deleted

## 2012-12-10 ENCOUNTER — Other Ambulatory Visit: Payer: Self-pay | Admitting: Medical

## 2012-12-10 DIAGNOSIS — Z79899 Other long term (current) drug therapy: Secondary | ICD-10-CM

## 2012-12-10 DIAGNOSIS — F319 Bipolar disorder, unspecified: Secondary | ICD-10-CM | POA: Diagnosis present

## 2012-12-10 DIAGNOSIS — R45851 Suicidal ideations: Secondary | ICD-10-CM

## 2012-12-10 DIAGNOSIS — F329 Major depressive disorder, single episode, unspecified: Secondary | ICD-10-CM

## 2012-12-10 DIAGNOSIS — F259 Schizoaffective disorder, unspecified: Principal | ICD-10-CM | POA: Diagnosis present

## 2012-12-10 DIAGNOSIS — F3289 Other specified depressive episodes: Secondary | ICD-10-CM

## 2012-12-10 DIAGNOSIS — R443 Hallucinations, unspecified: Secondary | ICD-10-CM

## 2012-12-10 HISTORY — DX: Headache: R51

## 2012-12-10 LAB — URINE MICROSCOPIC-ADD ON

## 2012-12-10 LAB — CBC WITH DIFFERENTIAL/PLATELET
Basophils Absolute: 0 10*3/uL (ref 0.0–0.1)
Basophils Relative: 1 % (ref 0–1)
Eosinophils Absolute: 0.1 10*3/uL (ref 0.0–0.7)
Eosinophils Relative: 1 % (ref 0–5)
Lymphocytes Relative: 41 % (ref 12–46)
MCV: 80.7 fL (ref 78.0–100.0)
Platelets: 203 10*3/uL (ref 150–400)
RDW: 12.9 % (ref 11.5–15.5)
WBC: 8.2 10*3/uL (ref 4.0–10.5)

## 2012-12-10 LAB — URINALYSIS, ROUTINE W REFLEX MICROSCOPIC
Glucose, UA: NEGATIVE mg/dL
Hgb urine dipstick: NEGATIVE
Specific Gravity, Urine: 1.039 — ABNORMAL HIGH (ref 1.005–1.030)

## 2012-12-10 LAB — BASIC METABOLIC PANEL
CO2: 27 mEq/L (ref 19–32)
Calcium: 9.6 mg/dL (ref 8.4–10.5)
GFR calc Af Amer: >90 mL/min (ref >90)
GFR calc non Af Amer: >90 mL/min (ref >90)
Sodium: 136 mEq/L (ref 135–145)

## 2012-12-10 LAB — RAPID URINE DRUG SCREEN, HOSP PERFORMED
Cocaine: NOT DETECTED
Opiates: NOT DETECTED

## 2012-12-10 LAB — PREGNANCY, URINE: Preg Test, Ur: NEGATIVE

## 2012-12-10 MED ORDER — LORAZEPAM 1 MG PO TABS: 1.0000 mg | ORAL_TABLET | Freq: Three times a day (TID) | ORAL | Status: DC | PRN

## 2012-12-10 MED ORDER — ZOLPIDEM TARTRATE 5 MG PO TABS: 5.0000 mg | ORAL_TABLET | Freq: Every evening | ORAL | Status: DC | PRN

## 2012-12-10 MED ORDER — ACETAMINOPHEN 325 MG PO TABS
650.0000 mg | ORAL_TABLET | Freq: Four times a day (QID) | ORAL | Status: DC | PRN
  Administered 2012-12-12 – 2012-12-15 (×4): 650 mg via ORAL

## 2012-12-10 MED ORDER — ACETAMINOPHEN 325 MG PO TABS: 650.0000 mg | ORAL_TABLET | ORAL | Status: DC | PRN

## 2012-12-10 MED ORDER — ALUM & MAG HYDROXIDE-SIMETH 200-200-20 MG/5ML PO SUSP: 30.0000 mL | ORAL | Status: DC | PRN

## 2012-12-10 MED ORDER — HYDROXYZINE HCL 50 MG PO TABS
50.0000 mg | ORAL_TABLET | Freq: Every evening | ORAL | Status: DC | PRN
  Administered 2012-12-13: 50 mg via ORAL

## 2012-12-10 MED ORDER — MAGNESIUM HYDROXIDE 400 MG/5ML PO SUSP: 30.0000 mL | Freq: Every day | ORAL | Status: DC | PRN

## 2012-12-10 MED ORDER — BENZTROPINE MESYLATE 1 MG PO TABS
1.0000 mg | ORAL_TABLET | Freq: Every day | ORAL | Status: DC
  Administered 2012-12-10: 1 mg via ORAL
  Filled 2012-12-10: qty 1

## 2012-12-10 MED ORDER — ONDANSETRON HCL 4 MG PO TABS: 4.0000 mg | ORAL_TABLET | Freq: Three times a day (TID) | ORAL | Status: DC | PRN

## 2012-12-10 MED ORDER — RISPERIDONE 2 MG PO TABS
2.0000 mg | ORAL_TABLET | Freq: Two times a day (BID) | ORAL | Status: DC
  Administered 2012-12-10 (×2): 2 mg via ORAL
  Filled 2012-12-10 (×2): qty 1
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
## 2012-12-10 NOTE — Consult Note (Signed)
Reason for Consult:Eval for IP psychiatric mgmt Referring Physician:Linker MD  Kathleen [name] is an 22 y.o. female.  HPI: Pt is a pleasant 22 y/o AAF known to [facility] presenting voluntarily after [place]uring [place] train [place]. Pt endorses continued SI, spawned by verbal command hallucinations telling her to hurt herself. Pt also endorses paranoia and continued depressive sx i.e. Hoplessness, guilt, helplessness, racing thoughts, difficulty with concentration and mood swings. Pt gives a hx of prior HI but denies any at present. Pt has been compliant with her antipsychotic Rx, but notes her sx have persisted since being d/c from [facility] several weeks ago.  Past Medical History  Diagnosis Date  . Depression   . Bipolar 1 disorder     Past Surgical History  Procedure Laterality Date  . Bunionectomy      both feet    History reviewed. No pertinent family history.  Social History:  reports that she has never smoked. She does not have any smokeless tobacco history on file. She reports that she does not drink alcohol or use illicit drugs.  Allergies: No Known Allergies  Medications: I have reviewed the patient's current medications.  Results for orders placed during the hospital encounter of 12/09/12 (from the past 48 hour(s))  CBC WITH DIFFERENTIAL     Status: Abnormal   Collection Time    12/10/12 12:14 AM      Result Value Range   WBC 8.2  4.0 - 10.5 K/uL   RBC 4.82  3.87 - 5.11 MIL/uL   Hemoglobin 12.3  12.0 - 15.0 g/dL   HCT 28.4  13.2 - 44.0 %   MCV 80.7  78.0 - 100.0 fL   MCH 25.5 (*) 26.0 - 34.0 pg   MCHC 31.6  30.0 - 36.0 g/dL   RDW 10.2  72.5 - 36.6 %   Platelets 203  150 - 400 K/uL   Neutrophils Relative % 50  43 - 77 %   Neutro Abs 4.1  1.7 - 7.7 K/uL   Lymphocytes Relative 41  12 - 46 %   Lymphs Abs 3.4  0.7 - 4.0 K/uL   Monocytes Relative 8  3 - 12 %   Monocytes Absolute 0.6  0.1 - 1.0 K/uL   Eosinophils Relative 1  0 - 5 %   Eosinophils Absolute 0.1  0.0 - 0.7 K/uL   Basophils Relative 1  0 - 1 %   Basophils Absolute 0.0  0.0 - 0.1 K/uL  BASIC METABOLIC PANEL     Status: Abnormal   Collection Time    12/10/12 12:14 AM      Result Value Range   Sodium 136  135 - 145 mEq/L   Potassium 3.7  3.5 - 5.1 mEq/L   Chloride 100  96 - 112 mEq/L   CO2 27  19 - 32 mEq/L   Glucose, Bld 99  70 - 99 mg/dL   BUN 4 (*) 6 - 23 mg/dL   Creatinine, Ser 4.40  0.50 - 1.10 mg/dL   Calcium 9.6  8.4 - 34.7 mg/dL   GFR calc non Af Amer >90  >90 mL/min   GFR calc Af Amer >90  >90 mL/min   Comment:            The eGFR has been calculated     using the CKD EPI equation.     This calculation has not been     validated in all clinical  situations.     eGFR's persistently     <90 mL/min signify     possible Chronic Kidney Disease.  ETHANOL     Status: None   Collection Time    12/10/12 12:14 AM      Result Value Range   Alcohol, Ethyl (B) <11  0 - 11 mg/dL   Comment:            LOWEST DETECTABLE LIMIT FOR     SERUM ALCOHOL IS 11 mg/dL     FOR MEDICAL PURPOSES ONLY  URINE RAPID DRUG SCREEN (HOSP PERFORMED)     Status: None   Collection Time    12/10/12  1:06 AM      Result Value Range   Opiates NONE DETECTED  NONE DETECTED   Cocaine NONE DETECTED  NONE DETECTED   Benzodiazepines NONE DETECTED  NONE DETECTED   Amphetamines NONE DETECTED  NONE DETECTED   Tetrahydrocannabinol NONE DETECTED  NONE DETECTED   Barbiturates NONE DETECTED  NONE DETECTED   Comment:            DRUG SCREEN FOR MEDICAL PURPOSES     ONLY.  IF CONFIRMATION IS NEEDED     FOR ANY PURPOSE, NOTIFY LAB     WITHIN 5 DAYS.                LOWEST DETECTABLE LIMITS     FOR URINE DRUG SCREEN     Drug Class       Cutoff (ng/mL)     Amphetamine      1000     Barbiturate      200     Benzodiazepine   200     Tricyclics       300     Opiates          300     Cocaine          300     THC              50  URINALYSIS, ROUTINE W REFLEX MICROSCOPIC      Status: Abnormal   Collection Time    12/10/12  1:06 AM      Result Value Range   Color, Urine Kathleen [name] (*) YELLOW   Comment: BIOCHEMICALS MAY BE AFFECTED BY COLOR   APPearance CLOUDY (*) CLEAR   Specific Gravity, Urine 1.039 (*) 1.005 - 1.030   pH 6.0  5.0 - 8.0   Glucose, UA NEGATIVE  NEGATIVE mg/dL   Hgb urine dipstick NEGATIVE  NEGATIVE   Bilirubin Urine NEGATIVE  NEGATIVE   Ketones, ur NEGATIVE  NEGATIVE mg/dL   Protein, ur 30 (*) NEGATIVE mg/dL   Urobilinogen, UA 1.0  0.0 - 1.0 mg/dL   Nitrite NEGATIVE  NEGATIVE   Leukocytes, UA SMALL (*) NEGATIVE  PREGNANCY, URINE     Status: None   Collection Time    12/10/12  1:06 AM      Result Value Range   Preg Test, Ur NEGATIVE  NEGATIVE   Comment:            THE SENSITIVITY OF THIS     METHODOLOGY IS >20 mIU/mL.  URINE MICROSCOPIC-ADD ON     Status: Abnormal   Collection Time    12/10/12  1:06 AM      Result Value Range   Squamous Epithelial / LPF MANY (*) RARE   WBC, UA 11-20  <3 WBC/hpf   Urine-Other MUCOUS PRESENT  No results found.  Review of Systems  Psychiatric/Behavioral: Positive for depression, suicidal ideas and hallucinations. Negative for substance abuse. The patient is nervous/anxious and has insomnia.        Patient endorses continued SI with gesturing to cut her neck with a knife, patient also with remote hx of HI and continued paranoia that people are trying to hurt her. Pt also endorses command verbal hallucinations to hurt herself as well.  All other systems reviewed and are negative.   Blood pressure 129/91, pulse 88, temperature 98.7 F (37.1 C), temperature source Oral, resp. rate 18, last menstrual period 10/28/2012, SpO2 100.00%. Physical Exam  Nursing note and vitals reviewed. Constitutional: She is oriented to person, place, and time. She appears well-developed and well-nourished.  HENT:  Head: Normocephalic.  Eyes: Pupils are equal, round, and reactive to light.  Neck: Neck supple. No  thyromegaly present.  Cardiovascular: Normal rate and regular rhythm.   Respiratory: Effort normal.  Neurological: She is alert and oriented to person, place, and time. No cranial nerve deficit.  Skin: Skin is warm and dry.  Psychiatric:  Pleasant affect, with limited insight and judgement. Fluid speech pattern with normal tone and rational thought process    Assessment/Plan: 1) Recommend IP admission to [facility] 400 hall pending bed for crises mgmt, safety and stability 2) Intensive IP psychotherapy and continued mgmt of psychotropics 3) Mgmt of applicable co-morbidities 4) Social work assistance to aid in [employer] and support services to decrease IP utilization and recurrent IP admissions.  Kathleen [name] E 12/10/2012, 2:03 AM

## 2012-12-10 NOTE — Tx Team (Signed)
Initial Interdisciplinary Treatment Plan  PATIENT STRENGTHS: (choose at least two) Active sense of humor Supportive family/friends  PATIENT STRESSORS: Financial difficulties Marital or family conflict Medication change or noncompliance   PROBLEM LIST: Problem List/Patient Goals Date to be addressed Date deferred Reason deferred Estimated date of resolution  Suicidal Ideation 12/10/12   dc        Schizoaffective Disorder 12/10/12   dc                                       DISCHARGE CRITERIA:  Ability to meet basic life and health needs Adequate post-discharge living arrangements Improved stabilization in mood, thinking, and/or behavior Safe-care adequate arrangements made Verbal commitment to aftercare and medication compliance  PRELIMINARY DISCHARGE PLAN: Attend aftercare/continuing care group  PATIENT/FAMIILY INVOLVEMENT: This treatment plan has been presented to and reviewed with the patient, Kathleen [name], and/or family member, .  The patient and family have been given the opportunity to ask questions and make suggestions.  Kathleen [name] 12/10/2012, 5:42 PM

## 2012-12-10 NOTE — ED Provider Notes (Signed)
Medical screening examination/treatment/procedure(s) were performed by non-physician practitioner and as supervising physician I was immediately available for consultation/collaboration.  Ethelda Chick, MD 12/10/12 0040
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
## 2012-12-10 NOTE — [facility] Counselor (Signed)
Patient accepted to [facility] by Dr. Ladona Ridgel (psychiatrist) to Dr. Jannifer Franklin. The room # is 406-2.

## 2012-12-11 DIAGNOSIS — F3130 Bipolar disorder, current episode depressed, mild or moderate severity, unspecified: Secondary | ICD-10-CM

## 2012-12-11 MED ORDER — TRAZODONE HCL 50 MG PO TABS
50.0000 mg | ORAL_TABLET | Freq: Every evening | ORAL | Status: DC | PRN
  Administered 2012-12-11 – 2012-12-15 (×5): 50 mg via ORAL
  Filled 2012-12-11 (×5): qty 1

## 2012-12-11 MED ORDER — LITHIUM CARBONATE 300 MG PO CAPS
600.0000 mg | ORAL_CAPSULE | Freq: Every day | ORAL | Status: DC
  Administered 2012-12-12 – 2012-12-16 (×5): 600 mg via ORAL
  Filled 2012-12-11 (×2): qty 2
  Filled 2012-12-11: qty 42
  Filled 2012-12-11 (×4): qty 2

## 2012-12-11 MED ORDER — HALOPERIDOL 5 MG PO TABS
5.0000 mg | ORAL_TABLET | Freq: Two times a day (BID) | ORAL | Status: DC
  Administered 2012-12-11 – 2012-12-16 (×10): 5 mg via ORAL
  Filled 2012-12-11 (×9): qty 1
  Filled 2012-12-11: qty 28
  Filled 2012-12-11: qty 1
  Filled 2012-12-11: qty 28
  Filled 2012-12-11 (×2): qty 1

## 2012-12-11 MED ORDER — LITHIUM CARBONATE 300 MG PO CAPS
300.0000 mg | ORAL_CAPSULE | Freq: Every day | ORAL | Status: DC
  Administered 2012-12-11 – 2012-12-15 (×5): 300 mg via ORAL
  Filled 2012-12-11 (×7): qty 1
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
## 2012-12-11 NOTE — Progress Notes (Addendum)
Pt has been attending groups and was eating a chocolate pudding. She stated,'I am as fine as I can be today." Pt stated she slept great last pm after getting trazadone for sleep at 10pm. Denies hearing any voices or any visual hallucinations. Pt is pleasant with good eye contact. She stated her depression is a 5/10 and her hopelessness is a 4/10. Pt would like to,"take care of herself."She states her appetite is good and she is feeling better. Pts affect is flat at times , blunted and very guarded.-Pt denies any urinary symptoms at this time and did state she is drinking cranberry juice and lots of water.

## 2012-12-11 NOTE — Progress Notes (Signed)
Patient ID: [name], female   DOB: [birth year], 22 y.o.   MRN: [id number] Psychoeducational Group Note  Date:  12/11/2012 Time:1000am  Group Topic/Focus:  Identifying Needs:   The focus of this group is to help patients identify their personal needs that have been historically problematic and identify healthy behaviors to address their needs.  Participation Level:  Did Not Attend  Participation Quality:    Affect: Cognitive:   Insight:   Engagement in Group: Additional Comments:  Inventory sheet and Psychoeducational  Valente David 12/11/2012,10:45 AM

## 2012-12-11 NOTE — BHH Group Notes (Signed)
B[facility] Group Notes:  (Clinical Social Work)  12/11/2012  11:15-11:45AM  Summary of Progress/Problems:   The main focus of today's process group was for the patient to identify ways in which they have in the past sabotaged their own recovery and reasons they may have done this/what they received from doing it.  We then worked to identify a specific plan to avoid doing this when discharged from the hospital for this admission.  The patient expressed that she wants to get away from [facility], and had a lengthy and convoluted description of the circumstances as to why she is here.  She did convey that she was put out of her father's home and could not get to appointments, and that is the reason she did not stay on medications.  She gets tired of meds, but also seems vaguely to understand she needs them.  Type of Therapy:  Group Therapy - Process  Participation Level:  Active  Participation Quality:  Attentive  Affect:  Blunted and Lethargic  Cognitive:  Confused  Insight:  Limited  Engagement in Therapy:  Developing/Improving  Modes of Intervention:  Clarification, Education, Exploration, Discussion  Ambrose Mantle, LCSW 12/11/2012, 4:50 PM

## 2012-12-11 NOTE — Progress Notes (Signed)
Patient ID: Kathleen [name], female   DOB: 03-23-1991, 22 y.o.   MRN: [id number] D. The patient presented as somewhat guarded, but pleasant. She was interacting in the milieu appropriately. Attended group and stated she was nervous about coming to [facility] and being admitted to the 400 hall, but she has made friends and is feeling more comfortable.  A. Met with patient 1:1 to assess. Encouraged to attend evening wrap up group. R. Attended and actively participated in evening group. Denies any suicidal/homicidal ideation and verbally contracts for safety. Denies any a/v hallucinations.

## 2012-12-11 NOTE — BHH Suicide Risk Assessment (Signed)
Suicide Risk Assessment  Admission Assessment     Information obtained from: Patient Demographic factors:   Current Mental Status per Physician Patient denies suicidal or homicidal ideation, hallucinations, illusions, or delusions. Patient engages with good eye contact, is able to focus adequately in a one to one setting, and has clear goal directed thoughts. Patient speaks with a natural conversational volume, rate, and tone. Anxiety was reported at 3 on a scale of 1 the least and 10 the most. Depression was reported at 4 on the same scale. Patient is oriented times 4, recent and remote memory intact. Judgement: limited by mental illness and addictive thinking Insight: limited by mental illness and addictive thinking  Loss Factors: Financial problems / change in socioeconomic status Historical Factors: Family history of mental illness or substance abuse Risk Reduction Factors: Religious beliefs about death  CLINICAL FACTORS:   Bipolar Disorder:   Mixed State  COGNITIVE FEATURES THAT CONTRIBUTE TO RISK:  Closed-mindedness Polarized thinking Thought constriction (tunnel vision)    SUICIDE RISK:   Moderate:  Frequent suicidal ideation with limited intensity, and duration, some specificity in terms of plans, no associated intent, good self-control, limited dysphoria/symptomatology, some risk factors present, and identifiable protective factors, including available and accessible social support.  PLAN OF CARE: 1 Admit for crisis management and stabilization.  Estimate length of stay is 3 to 5 days.  2. Medication management to reduce current symptoms to base line and improve the patient's overall level of functioning. Restart past effective medications.  3. Treat health problems as indicated:  4. Develop treatment plan to decrease risk of relapse upon discharge and the need for readmission.  5. Psycho-social education regarding relapse prevention and self-care.  6. Review and  reinstate any pertinent home medication for other health issues where appropriate.  7. Call for Consult with [facility] for additional specialty patient services as needed.  I certify that inpatient services furnished can reasonably be expected to improve the patient's condition.  Orson Aloe, MD, [facility] 12/11/2012 2:45 PM

## 2012-12-11 NOTE — Progress Notes (Signed)
The focus of this group is to help patients review their daily goal of treatment and discuss progress on daily workbooks.  During wrap up group, Kathleen [name] shared that one positive about her day was that her family called and she was able to have a wonderful conversation with her mom. Patient stated that her goal for tomorrow is to have a repeat of today, because today had been a great day for her.

## 2012-12-12 MED ORDER — BENZTROPINE MESYLATE 1 MG PO TABS
1.0000 mg | ORAL_TABLET | Freq: Every day | ORAL | Status: DC
  Administered 2012-12-12 – 2012-12-16 (×5): 1 mg via ORAL
  Filled 2012-12-12 (×3): qty 1
  Filled 2012-12-12: qty 14
  Filled 2012-12-12 (×4): qty 1

## 2012-12-12 NOTE — Progress Notes (Signed)
Patient ID: Kathleen [name], female   DOB: August 13, 1990, 22 y.o.   MRN: [id number] D. The patient had a flat affect and depressed mood. She interacted appropriately in the milieu. Expressed being happy that her mother called her today. She didn't think her mother would bother to call, and was very pleased that she had. A. Met with patient to assess. Encouraged to attend evening wrap up group. Administered medication for sleep. Gave heat packs for knee pain. R. The patient attended and actively participated in evening group. C/o having knee pain and felt the need to move her legs to relieve the sensation she was feeling while she was laying in bed after receiving the Trazodone.

## 2012-12-12 NOTE — Progress Notes (Signed)
Nursing Progress Note. D. Patient presents with blunted affect, depressed mood but brightens upon approach. Patient states ''well I did have trouble sleeping last night especially at first because of my legs - I just couldn't get them to calm down they were jumpy'' Patient reports this feeling persisted in am, writer informed patient that MD will be made aware. Nursing notified MD due to suspected akathesia and orders received for Cogentin. Patient denies any pain or any other acute concerns at this time. She also denies any auditory or visual hallucinations at this time. Denies SI/HI. Patient compliant with medications , no refusal of meds or meals at this time. Q 15 minute checks for safety. Will continue to monitor.

## 2012-12-12 NOTE — Progress Notes (Signed)
[facility] MD Progress Note  12/12/2012 1:42 PM Kathleen [name]  MRN:  [id number] Subjective:  "I'm doing better.  When will I leave"? Diagnosis:   Axis I: Schizoaffective Disorder Axis II: Deferred Axis III:  Past Medical History  Diagnosis Date  . Depression   . Bipolar 1 disorder   . Headache(784.0)    Axis IV: other psychosocial or environmental problems Axis V: 51-60 moderate symptoms  ADL's:  Intact  Sleep: Fair  Appetite:  Good  Suicidal Ideation:  Pt denies any thoughts, plans, intent of suicide Homicidal Ideation:  Pt denies any thoughts, plans, intent of homicide  AEB (as evidenced by):per pt report and behavior observed on the unit  Psychiatric Specialty Exam: ROS  Blood pressure 107/75, pulse 77, temperature 98.1 F (36.7 C), temperature source Oral, resp. rate 16, height 5\' 7"  (1.702 m), weight 102.059 kg (225 lb), last menstrual period 10/28/2012, SpO2 99.00%.Body mass index is 35.23 kg/(m^2).  General Appearance: Casual  Eye Contact::  Fair  Speech:  Clear and Coherent  Volume:  Normal  Mood:  Euthymic  Affect:  Congruent  Thought Process:  Coherent  Orientation:  Full (Time, Place, and Person)  Thought Content:  WDL  Suicidal Thoughts:  No  Homicidal Thoughts:  No  Memory:  Immediate;   Fair  Judgement:  Fair  Insight:  Fair  Psychomotor Activity:  Normal  Concentration:  Fair  Recall:  Fair  Akathisia:  No  Handed:  Right  AIMS (if indicated):     Assets:  Communication Skills Desire for Improvement  Sleep:  Number of Hours: 6   Current Medications: Current Facility-Administered Medications  Medication Dose Route Frequency Provider Last Rate Last Dose  . acetaminophen (TYLENOL) tablet 650 mg  650 mg Oral Q6H PRN Court Joy, PA-C      . alum & mag hydroxide-simeth (MAALOX/MYLANTA) 200-200-20 MG/5ML suspension 30 mL  30 mL Oral Q4H PRN Court Joy, PA-C      . benztropine (COGENTIN) tablet 1 mg  1 mg Oral Daily Mike Craze, MD   1  mg at 12/12/12 1034  . haloperidol (HALDOL) tablet 5 mg  5 mg Oral BID Mike Craze, MD   5 mg at 12/12/12 4098  . hydrOXYzine (ATARAX/VISTARIL) tablet 50 mg  50 mg Oral QHS PRN,MR X 1 Court Joy, PA-C      . lithium carbonate capsule 300 mg  300 mg Oral QPC supper Mike Craze, MD   300 mg at 12/11/12 1749  . lithium carbonate capsule 600 mg  600 mg Oral QPC breakfast Mike Craze, MD   600 mg at 12/12/12 0805  . magnesium hydroxide (MILK OF MAGNESIA) suspension 30 mL  30 mL Oral Daily PRN Court Joy, PA-C      . traZODone (DESYREL) tablet 50 mg  50 mg Oral QHS PRN,MR X 1 Mike Craze, MD   50 mg at 12/11/12 2208    Lab Results: No results found for this or any previous visit (from the past 48 hour(s)).  Physical Findings: AIMS: Facial and Oral Movements Muscles of Facial Expression: None, normal Lips and Perioral Area: None, normal Jaw: None, normal Tongue: None, normal,Extremity Movements Upper (arms, wrists, hands, fingers): None, normal Lower (legs, knees, ankles, toes): None, normal, Trunk Movements Neck, shoulders, hips: None, normal, Overall Severity Severity of abnormal movements (highest score from questions above): None, normal Incapacitation due to abnormal movements: None, normal Patient's awareness of abnormal movements (rate  only patient's report): No Awareness, Dental Status Current problems with teeth and/or dentures?: No Does patient usually wear dentures?: No  CIWA:    COWS:     Treatment Plan Summary: Daily contact with patient to assess and evaluate symptoms and progress in treatment Medication management  Plan:  Medical Decision Making Problem Points:  Established problem, stable/improving (1), Review of last therapy session (1) and Review of psycho-social stressors (1) Data Points:  Review or order medicine tests (1) Review of medication regiment & side effects (2)  I certify that inpatient services furnished can reasonably be expected  to improve the patient's condition.   Rylann Munford 12/12/2012, 1:42 PM

## 2012-12-12 NOTE — BHH Group Notes (Signed)
BHH Group Notes:  (Clinical Social Work)  12/12/2012   11:15-11:45AM  Summary of Progress/Problems:  The main focus of today's process group was to listen to a variety of genres of music and to identify that different types of music provoke different responses.  The patient then was able to identify personally what was soothing for them, as well as energizing.  Handouts were used to record feelings evoked, as well as how patient can personally use this knowledge in sleep habits, with depression, and with other symptoms.  The patient expressed little initially, as she was quite sedated, but she woke up eventually and participated more fully.  Her affect was less restricted than yesterday.  Type of Therapy:  Music Therapy   Participation Level:  Minimal  Participation Quality:  Drowsy  Affect:  Blunted  Cognitive:  Appropriate  Insight:  Developing/Improving  Engagement in Therapy:  Developing/Improving  Modes of Intervention:   Activity, Exploration  Ambrose Mantle, LCSW 12/12/2012, 12:41 PM

## 2012-12-12 NOTE — Progress Notes (Signed)
Patient ID: [name], female   DOB: [birth year], 22 y.o.   MRN: [id number] Psychoeducational Group Note  Date:  12/12/2012 Time:  1000am  Group Topic/Focus:  Making Healthy Choices:   The focus of this group is to help patients identify negative/unhealthy choices they were using prior to admission and identify positive/healthier coping strategies to replace them upon discharge.  Participation Level:  Did Not Attend  Participation Quality:    Affect: Cognitive:  Insight: Engagement in Group:  Additional Comments:  Inventory and Psychoeducational group   Valente David 12/12/2012,10:09 AM

## 2012-12-13 DIAGNOSIS — F259 Schizoaffective disorder, unspecified: Principal | ICD-10-CM

## 2012-12-13 LAB — LITHIUM LEVEL: Lithium Lvl: 0.56 mEq/L — ABNORMAL LOW (ref 0.80–1.40)

## 2012-12-13 NOTE — Progress Notes (Signed)
Patient ID: Kathleen [name], female   DOB: 1991/03/29, 22 y.o.   MRN: [id number] [facility] MD Progress Note  12/13/2012 2:36 PM Kathleen [name]  MRN:  [id number] Subjective: "I'm feeling wonderful today." Patient stated she likely by Friday as she has plans for the Fourth of July holiday.   Diagnosis:   Axis I: Schizoaffective Disorder Axis II: Deferred Axis III:  Past Medical History  Diagnosis Date  . Depression   . Bipolar 1 disorder   . Headache(784.0)    Axis IV: other psychosocial or environmental problems Axis V: 51-60 moderate symptoms  ADL's:  Intact  Sleep: Fair  Appetite:  Good  Suicidal Ideation:  Pt denies any thoughts, plans, intent of suicide Homicidal Ideation:  Pt denies any thoughts, plans, intent of homicide  AEB (as evidenced by):per pt report and behavior observed on the unit  Psychiatric Specialty Exam: ROS  Blood pressure 119/84, pulse 107, temperature 98 F (36.7 C), temperature source Oral, resp. rate 20, height 5\' 7"  (1.702 m), weight 102.059 kg (225 lb), last menstrual period 10/28/2012, SpO2 99.00%.Body mass index is 35.23 kg/(m^2).  General Appearance: Casual  Eye Contact::  Fair  Speech:  Clear and Coherent  Volume:  Normal  Mood:  euphoric  Affect:  Congruent  Thought Process:  Coherent  Orientation:  Full (Time, Place, and Person)  Thought Content:  WDL  Suicidal Thoughts:  No  Homicidal Thoughts:  No  Memory:  Immediate;   Fair  Judgement:  Fair  Insight:  Fair  Psychomotor Activity:  Normal  Concentration:  Fair  Recall:  Fair  Akathisia:  No  Handed:  Right  AIMS (if indicated):     Assets:  Communication Skills Desire for Improvement  Sleep:  Number of Hours: 6.75   Current Medications: Current Facility-Administered Medications  Medication Dose Route Frequency Provider Last Rate Last Dose  . acetaminophen (TYLENOL) tablet 650 mg  650 mg Oral Q6H PRN Court Joy, PA-C   650 mg at 12/12/12 2215  . alum & mag  hydroxide-simeth (MAALOX/MYLANTA) 200-200-20 MG/5ML suspension 30 mL  30 mL Oral Q4H PRN Court Joy, PA-C      . benztropine (COGENTIN) tablet 1 mg  1 mg Oral Daily Mike Craze, MD   1 mg at 12/13/12 0749  . haloperidol (HALDOL) tablet 5 mg  5 mg Oral BID Mike Craze, MD   5 mg at 12/13/12 0749  . hydrOXYzine (ATARAX/VISTARIL) tablet 50 mg  50 mg Oral QHS PRN,MR X 1 Court Joy, PA-C      . lithium carbonate capsule 300 mg  300 mg Oral QPC supper Mike Craze, MD   300 mg at 12/12/12 1639  . lithium carbonate capsule 600 mg  600 mg Oral QPC breakfast Mike Craze, MD   600 mg at 12/13/12 0749  . magnesium hydroxide (MILK OF MAGNESIA) suspension 30 mL  30 mL Oral Daily PRN Court Joy, PA-C      . traZODone (DESYREL) tablet 50 mg  50 mg Oral QHS PRN,MR X 1 Mike Craze, MD   50 mg at 12/12/12 2215    Lab Results: No results found for this or any previous visit (from the past 48 hour(s)).  Physical Findings: AIMS: Facial and Oral Movements Muscles of Facial Expression: None, normal Lips and Perioral Area: None, normal Jaw: None, normal Tongue: None, normal,Extremity Movements Upper (arms, wrists, hands, fingers): None, normal Lower (legs, knees, ankles, toes): None, normal, Trunk  Movements Neck, shoulders, hips: None, normal, Overall Severity Severity of abnormal movements (highest score from questions above): None, normal Incapacitation due to abnormal movements: None, normal Patient's awareness of abnormal movements (rate only patient's report): No Awareness, Dental Status Current problems with teeth and/or dentures?: No Does patient usually wear dentures?: No  CIWA:    COWS:     Treatment Plan Summary: Daily contact with patient to assess and evaluate symptoms and progress in treatment Medication management  Plan: 1. Continue current plan of care. 2. Lithium level Wednesday AM.  Medical Decision Making Problem Points:  Established problem,  stable/improving (1), Review of last therapy session (1) and Review of psycho-social stressors (1) Data Points:  Review or order medicine tests (1) Review of medication regiment & side effects (2)  I certify that inpatient services furnished can reasonably be expected to improve the patient's condition.  Rona Ravens. Joycelyn Liska RPAC 2:39 PM 12/13/2012

## 2012-12-13 NOTE — Progress Notes (Signed)
D: Pt denies SI/HI/AVH. Pt reports decreased depression. Pt compliant with taking meds and attending groups. Pt denies any adverse reactions to her meds. Pt forwards little information and is guarded. Pt presents with flat affect. A: medications administered as ordered per MD. Verbal support given. Pt encouraged to attend groups. 15 minute checks performed for safety. R: pt receptive to treatment. Safety maintained.

## 2012-12-13 NOTE — BHH Group Notes (Signed)
[facility] LCSW Aftercare Discharge Planning Group Note   12/13/2012 8:28 AM  Participation Quality:  Engaged  Mood/Affect:  Appropriate  Depression Rating:  5  Anxiety Rating:  6  Thoughts of Suicide:  No Will you contract for safety?   NA  Current AVH:  No  Plan for Discharge/Comments:  Kathleen [name] states she is stressed due to family relations.  Also, she and her boyfriend are "taking a break" from each other.  Did not follow up at [facility] at d/c last time.  Says she is happy because her mother says she can stay with her.  Pointed out that she said she gets along with no family members.  She confirmed that, but says she will only be there at night anyway, so no big deal.  Transportation Means: family  Supports: family  [name], [name]

## 2012-12-13 NOTE — Progress Notes (Signed)
Patient ID: Kathleen [name], female   DOB: 22-Dec-1990, 22 y.o.   MRN: [id number] D:  Pt resting in bed with eyes closed.  No distress noted. A:  Fifteen minute checks continues for patient safety. R:  Pt remains safe on unit.

## 2012-12-13 NOTE — Progress Notes (Signed)
Adult Psychoeducational Group Note  Date:  12/13/2012 Time:  4:12 AM  Group Topic/Focus:  Wrap-Up Group:   The focus of this group is to help patients review their daily goal of treatment and discuss progress on daily workbooks.  Participation Level:  Active  Participation Quality:  Appropriate  Affect:  Appropriate  Cognitive:  Appropriate  Insight: Appropriate  Engagement in Group:  Engaged  Modes of Intervention:  Support  Additional Comments:  Patient attended and participated in group tonight. She reports that she has been sleeping all day. She got up to eat her meals and attend her groups. She advised that she talked to her family today also.  Lita Mains [facility] 12/13/2012, 4:12 AM

## 2012-12-13 NOTE — Progress Notes (Signed)
Recreation Therapy Notes  Date: 06.30.2014 Time: 9:30am Location: 400 Hall Dayroom      Group Topic/Focus: Leisure Education  Participation Level: Active  Participation Quality: Approrpaite  Affect: Flat  Cognitive: Appropriate   Additional Comments: Activity: Adapted On Deck ; Explanation: Patients were given a di to roll, if the patient rolled a 1 - 3 patient was asked to act out a leisure/recreation activity selected from container. IF patient rolled 4-6 patient was asked to draw leisure/recreaiton activity.   Patient actively participated in group activity. Patient had to draw a leisure/recreation activity for peers to guess. Peers were not able to guess activity using patient initial drawing. LRT offered assistance to patient, patient accepted LRT help. Following LRT suggestions peers were able to guess patient activity. Patient listened to discussion about the importance of leisure/recreation to balance in life.   [name] Josiah Wojtaszek, LRT/CTRS  Lyrique Hakim L 12/13/2012 4:06 PM

## 2012-12-13 NOTE — Progress Notes (Addendum)
D: Pt is flat in affect and depressed in mood. Pt did not attend group this evening. Pt observed interacting minimally within the milieu. Pt is complaining of throbbing knee pain of 8/10. Pt reports that she is eager to go home so she can handle some things. She is concerned about how long it would take for her to be placed into a group home. She is concerned about spending time with her mom before going to the group home.  A: Writer administered scheduled and prn medications to pt. Continued support and availability as needed was extended to this pt. Staff continue to monitor pt with q69min checks.  R: No adverse drug reactions noted. Pt receptive to treatment. Pt remains safe at this time.

## 2012-12-13 NOTE — Progress Notes (Signed)
Adult Psychoeducational Group Note  Date:  12/13/2012 Time:  1:42 PM  Group Topic/Focus:  Goals Group:   The focus of this group is to help patients establish daily goals to achieve during treatment and discuss how the patient can incorporate goal setting into their daily lives to aide in recovery.  Participation Level:  Active  Participation Quality:  Appropriate  Affect:  Appropriate  Cognitive:  Appropriate  Insight: Appropriate  Engagement in Group:  Engaged  Modes of Intervention:  Activity and Education  Additional Comments:  Pt participated in group.  Marquis Lunch, Joncarlo Friberg 12/13/2012, 1:42 PM

## 2012-12-13 NOTE — [facility] Group Notes (Signed)
[facility] LCSW Group Therapy  12/13/2012 1:15 pm  Type of Therapy: Process Group Therapy  Participation Level: Minimal  Participation Quality:  Appropriate  Affect:  Flat  Cognitive:  Oriented  Insight:  ILimited  Engagement in Group:  Limited  Engagement in Therapy:  Limited  Modes of Intervention:  Activity, Clarification, Education, Problem-solving and Support  Summary of Progress/Problems: Today's group addressed the issue of overcoming obstacles.  Patients were asked to identify their biggest obstacle post d/c that stands in the way of their on-going success, and then problem solve as to how to manage this. Kathleen [name] stated her biggest barrier is putting other's needs before her own.  She went on to say that when others talk about her, she is afraid of what they are saying about her, so she tries to please them.  Her solution is to learn to say "no."  Tried to problem solve with her further, but she was uninterested.  Spent most of group leaning against wall with her eyes closed.  Limited insight.  Kathleen [name] 12/13/2012   5:06 PM

## 2012-12-13 NOTE — Progress Notes (Signed)
Patient ID: Kathleen [name], female   DOB: [birth year], 22 y.o.   MRN: [id number] The patient spent most of the evening in her room resting in bed. Encouraged to come to evening wrap up group. Minimal participation in group. Stated she felt her medication was making her tired. Denied any suicidal ideation. C/o knee pain.

## 2012-12-13 NOTE — Tx Team (Signed)
  Interdisciplinary Treatment Plan Update   Date Reviewed:  12/13/2012  Time Reviewed:  8:12 AM  Progress in Treatment:   Attending groups: Yes Participating in groups: Yes Taking medication as prescribed: Yes  Tolerating medication: Yes Family/Significant other contact made: No Patient understands diagnosis: Yes As evidenced by asking for help with psychosis and depression Discussing patient identified problems/goals with staff: Yes  See initial plan Medical problems stabilized or resolved: Yes Denies suicidal/homicidal ideation: Yes  In tx team Patient has not harmed self or others: Yes  For review of initial/current patient goals, please see plan of care.  Estimated Length of Stay:  2-3 days  Reason for Continuation of Hospitalization: Depression Hallucinations Medication stabilization  New Problems/Goals identified:  N/A  Discharge Plan or Barriers:   stay with mother, follow up outpt  Additional Comments:  Patient is 22 year old Afro-American female who [facility] experiencing severe depression having auditory hallucinations and thoughts of killing herself. She reported that she attempted to cut herself with a knifel. She does not feel safe by herself. She admitted hearing voices which are telling her to kill herself and others. Patient told that she just wanted to die and if she does not get any help she may either kill herself or someone else. Patient has suicidal attempt in the past by cutting her wrist, drown herself in the bathtub and put a plastic bag over her head.  She stated that her dad put her out and now she has no place to live.   Attendees:  Signature: Thedore Mins, MD 12/13/2012 8:12 AM   Signature: Richelle Ito, LCSW 12/13/2012 8:12 AM  Signature: Verne Spurr, PA 12/13/2012 8:12 AM  Signature: Joslyn Devon, RN 12/13/2012 8:12 AM  Signature: Liborio Nixon, RN 12/13/2012 8:12 AM  Signature:  12/13/2012 8:12 AM   Signature:   12/13/2012 8:12 AM  Signature:    Signature:    Signature:    Signature:    Signature:    Signature:      Scribe for Treatment Team:   Richelle Ito, LCSW  12/13/2012 8:12 AM

## 2012-12-14 MED ORDER — HYDROXYZINE HCL 50 MG PO TABS
50.0000 mg | ORAL_TABLET | Freq: Every evening | ORAL | Status: DC | PRN
  Filled 2012-12-14: qty 1

## 2012-12-14 NOTE — Progress Notes (Signed)
Patient ID: Kathleen [name], female   DOB: 03-02-1991, 22 y.o.   MRN: [id number] [facility] MD Progress Note  12/14/2012 11:35 AM Kathleen [name]  MRN:  [id number] Subjective: " I am feeling much better on my current medications." Objective: Patient reports decreased mood swings and agitation. She reports ongoing stress when she thinks about her family. Patient is compliant with her medications and has not reported any adverse reactions.   Diagnosis:   Axis I: Schizoaffective Disorder Axis II: Deferred Axis III:  Past Medical History  Diagnosis Date  . Headache(784.0)    Axis IV: other psychosocial or environmental problems Axis V: 51-60 moderate symptoms  ADL's:  Intact  Sleep: Fair  Appetite:  Good  Suicidal Ideation:  Pt denies any thoughts, plans, intent of suicide Homicidal Ideation:  Pt denies any thoughts, plans, intent of homicide  AEB (as evidenced by):per pt report and behavior observed on the unit  Psychiatric Specialty Exam: ROS  Blood pressure 116/82, pulse 99, temperature 97.9 F (36.6 C), temperature source Oral, resp. rate 18, height 5\' 7"  (1.702 m), weight 102.059 kg (225 lb), last menstrual period 10/28/2012, SpO2 99.00%.Body mass index is 35.23 kg/(m^2).  General Appearance: Casual  Eye Contact::  Fair  Speech:  Clear and Coherent  Volume:  Normal  Mood:  euphoric  Affect:  Congruent  Thought Process:  Coherent  Orientation:  Full (Time, Place, and Person)  Thought Content:  WDL  Suicidal Thoughts:  No  Homicidal Thoughts:  No  Memory:  Immediate;   Fair  Judgement:  Fair  Insight:  Fair  Psychomotor Activity:  Normal  Concentration:  Fair  Recall:  Fair  Akathisia:  No  Handed:  Right  AIMS (if indicated):     Assets:  Communication Skills Desire for Improvement  Sleep:  Number of Hours: 6.75   Current Medications: Current Facility-Administered Medications  Medication Dose Route Frequency Provider Last Rate Last Dose  . acetaminophen  (TYLENOL) tablet 650 mg  650 mg Oral Q6H PRN Court Joy, PA-C   650 mg at 12/12/12 2215  . alum & mag hydroxide-simeth (MAALOX/MYLANTA) 200-200-20 MG/5ML suspension 30 mL  30 mL Oral Q4H PRN Court Joy, PA-C      . benztropine (COGENTIN) tablet 1 mg  1 mg Oral Daily Mike Craze, MD   1 mg at 12/13/12 0749  . haloperidol (HALDOL) tablet 5 mg  5 mg Oral BID Mike Craze, MD   5 mg at 12/13/12 0749  . hydrOXYzine (ATARAX/VISTARIL) tablet 50 mg  50 mg Oral QHS PRN,MR X 1 Court Joy, PA-C      . lithium carbonate capsule 300 mg  300 mg Oral QPC supper Mike Craze, MD   300 mg at 12/12/12 1639  . lithium carbonate capsule 600 mg  600 mg Oral QPC breakfast Mike Craze, MD   600 mg at 12/13/12 0749  . magnesium hydroxide (MILK OF MAGNESIA) suspension 30 mL  30 mL Oral Daily PRN Court Joy, PA-C      . traZODone (DESYREL) tablet 50 mg  50 mg Oral QHS PRN,MR X 1 Mike Craze, MD   50 mg at 12/12/12 2215    Lab Results:  Results for orders placed during the hospital encounter of 12/10/12 (from the past 48 hour(s))  LITHIUM LEVEL     Status: Abnormal   Collection Time    12/13/12  7:53 PM      Result Value Range  Lithium Lvl 0.56 (*) 0.80 - 1.40 mEq/L    Physical Findings: AIMS: Facial and Oral Movements Muscles of Facial Expression: None, normal Lips and Perioral Area: None, normal Jaw: None, normal Tongue: None, normal,Extremity Movements Upper (arms, wrists, hands, fingers): None, normal Lower (legs, knees, ankles, toes): None, normal, Trunk Movements Neck, shoulders, hips: None, normal, Overall Severity Severity of abnormal movements (highest score from questions above): None, normal Incapacitation due to abnormal movements: None, normal Patient's awareness of abnormal movements (rate only patient's report): No Awareness, Dental Status Current problems with teeth and/or dentures?: No Does patient usually wear dentures?: No  CIWA:    COWS:     Treatment  Plan Summary: Daily contact with patient to assess and evaluate symptoms and progress in treatment Medication management  Plan: 1. Patient to continue current medication regimen. 2. Lithium level on 12/16/12  Medical Decision Making Problem Points:  Established problem, stable/improving (1), Review of last therapy session (1) and Review of psycho-social stressors (1) Data Points:  Review or order medicine tests (1) Review of medication regiment & side effects (2)  I certify that inpatient services furnished can reasonably be expected to improve the patient's condition.  Thedore Mins, MD 11:35 AM 12/14/2012

## 2012-12-14 NOTE — [facility] Group Notes (Signed)
[facility] LCSW Aftercare Discharge Planning Group Note   12/14/2012 10:56 AM  Participation Quality:  Minimal  Mood/Affect:  Flat  Depression Rating:  5  Anxiety Rating:  5  Thoughts of Suicide:  No Will you contract for safety?   NA  Current AVH:  No  Plan for Discharge/Comments:  States the medicine is strong, making her somewhat sedated, "but it is what I need."  Still focused on leaving by Thursday.  Gave permission for me to call boyfriend.  Asked about GH placement.  Appeared to be doing out of obligation so that she could report back that she asked.  Transportation Means: family  Supports: family  [name], [name]

## 2012-12-14 NOTE — [facility] Suicide Risk Assessment (Signed)
[facility] INPATIENT:  Family/Significant Other Suicide Prevention Education  Suicide Prevention Education:  Education Completed; Randa Lynn, [place], [place] [address] has been identified by the patient as the family member/significant other with whom the patient will be residing, and identified as the person(s) who will aid the patient in the event of a mental health crisis (suicidal ideations/suicide attempt).  With written consent from the patient, the family member/significant other has been provided the following suicide prevention education, prior to the and/or following the discharge of the patient.  The suicide prevention education provided includes the following:  Suicide risk factors  Suicide prevention and interventions  National Suicide Hotline telephone number  [facility] assessment telephone number  [facility] Emergency Assistance 911  [facility] and/or Residential Mobile Crisis Unit telephone number  Request made of family/significant other to:  Remove weapons (e.g., guns, rifles, knives), all items previously/currently identified as safety concern.    Remove drugs/medications (over-the-counter, prescriptions, illicit drugs), all items previously/currently identified as a safety concern.  The family member/significant other verbalizes understanding of the suicide prevention education information provided.  The family member/significant other agrees to remove the items of safety concern listed above.  [name] 12/14/2012, 12:53 PM

## 2012-12-14 NOTE — Progress Notes (Signed)
D: Pt presents with flat affect and depressed mood. Pt have minimal interaction on the milieu and forwards little information. Pt in bed all morning, c/o bilateral leg pain. Pt describes the pain as throbbing. Pt reports a decrease in pain d/t her resting in bed. Pt reports no adverse reaction to her meds. Pt reports that the meds are effective and that she has not had any self harm thoughts. A: Medications administered as ordered per MD. Verbal support given. Pt encouraged to attend groups. 15 minute checks performed for safety. R: Safety maintained. Pt receptive to treatment.

## 2012-12-14 NOTE — [facility] Group Notes (Signed)
[facility] LCSW Group Therapy  12/14/2012 , 12:50 PM   Type of Therapy:  Group Therapy  Participation Level:  Minimal  Participation Quality:  Distracted  Affect:  Sleepy  Cognitive:  Oriented  Insight:  ILimited  Engagement in Therapy:  None  Modes of Intervention:  Discussion, Exploration and Socialization  Summary of Progress/Problems: Today's group focused on the term Diagnosis.  Participants were asked to define the term, and then pronounce whether it is a negative, positive or neutral term.  Kathleen [name] abruptly got up 5 minutes into the group and came back 15 minutes later with a new outfit on.  She closed her eyes for the rest of group, and when questioned admitted that it was due to her medication, but denied it is sedating.  "It is strong, and that is what I need."  [name] 12/14/2012 , 12:50 PM

## 2012-12-14 NOTE — Progress Notes (Signed)
Adult Psychoeducational Group Note  Date:  12/14/2012 Time:  10:18 PM  Group Topic/Focus:  Wrap-Up Group:   The focus of this group is to help patients review their daily goal of treatment and discuss progress on daily workbooks.  Participation Level:  Minimal  Participation Quality:  Appropriate  Affect:  Appropriate  Cognitive:  Appropriate  Insight: Limited  Engagement in Group:  Engaged  Modes of Intervention:  Support  Additional Comments:  Patient attended and participated in group tonight. She reports having a wonderful day. She slept a lot and she believe that is due to her medications. She was able to attend her groups and went down for meals. For her recovery she plans to take her medication, take time out for herself and plans on continuing with [facility].  Lita Mains [facility] 12/14/2012, 10:18 PM

## 2012-12-14 NOTE — Progress Notes (Signed)
D: Patient in day room during this assessment. She reports that her medications working; denies SI/Hi and denies hallucinations. Her mood and affects somewhat flat and depressed. She denies feeling depressed and denies any complain. Writer denies SI/HI and denies hallucinations. Q 15 minute check continues as ordered to maintain safety.

## 2012-12-14 NOTE — Progress Notes (Signed)
Adult Psychoeducational Group Note  Date:  12/14/2012 Time:  10:37 AM  Group Topic/Focus:  Recovery Goals:   The focus of this group is to identify appropriate goals for recovery and establish a plan to achieve them.  Participation Level:  Minimal  Participation Quality:  Attentive  Affect:  Blunted and Flat  Cognitive:  Appropriate  Insight: Lacking  Engagement in Group:  Limited  Modes of Intervention:  Activity, Discussion, Socialization and Support  Additional Comments:  Pt did come to group, but at the very end. Pt did share her goal of staying on her medication and seeing the psychiatrist that [facility] sets her up with. This group focused on recovery and the pt named two things that needed to change in their life to continue towards recovery. Pt made two measurable and realistic goals for how they would acheive the change they wanted to see their life.    Cathlean Cower 12/14/2012, 10:37 AM

## 2012-12-15 NOTE — Progress Notes (Signed)
D: Patient in the hall way interacting with peers. Her mood and affects bright on approach. She reported that she had a great day and said she's ready forward to her discharge tomorrow. She stated that she will be returning home to stay with her father. She denied SI/HI and denied hallucinations. She also reported that she will need a bus ticket and medication samples tomorrow. A: Writer encouraged and supported patient. Writer asked patient if her father has visited since she's in the Hospital  R: Patient receptive to encouragement and support. She said her father visited. Q 15 minute check continues as ordered to maintain safety.

## 2012-12-15 NOTE — Progress Notes (Signed)
Patient ID: Kathleen [name], female   DOB: 1991-04-19, 22 y.o.   MRN: [id number] Pt did not attend group

## 2012-12-15 NOTE — [facility] Group Notes (Signed)
[facility] [employer] Group Therapy  12/15/2012 , 2:22 PM    Type of Therapy:  [employer] Presentation  Participation Level: Came to group, sat for 5 minutes, left and did not return   Summary of Progress/Problems:  Onalee Hua from [employer] came to present his recovery story and play the guitar.    [name] 12/15/2012 , 2:22 PM

## 2012-12-15 NOTE — Progress Notes (Signed)
D: Pt denies SI/HI/AVH. Pt is bright on approach. Improved hygiene. Pt denies feeling drowsy this morning and is more engaged on the milieu today. Pt reports feeling better today.Pt reports no adverse reactions to her meds. Pt is compliant with taking meds and attending groups. . Pt is cooperative and pleasant.  A: Medications administered as ordered per MD. Verbal support given. Pt encouraged to attend groups.15 minute checks performed for safety.  R: Pt is  receptive to treatment. Safety maintained.

## 2012-12-15 NOTE — [facility] Group Notes (Signed)
[facility] LCSW Aftercare Discharge Planning Group Note   12/15/2012 1:55 PM  Participation Quality:  Engaged  Mood/Affect:  Appropriate  Depression Rating:  denies  Anxiety Rating:  denies  Thoughts of Suicide:  No Will you contract for safety?   NA  Current AVH:  No  Plan for Discharge/Comments:  Kathleen [name] is looking forward to d/c tomorrow.  She states her father is picking her up, she will stay with him for one night, and then take her to stay with mother in [place] for a week or two.  She will then return to stay with father.  I told her this did not sound like a great plan, especially since when she came in she was saying that will never live with her father again.  She will not be dissuaded.  Transportation Means: family  Supports: family, friends  [name], [place] B

## 2012-12-15 NOTE — Progress Notes (Signed)
Recreation Therapy Notes  Date: 07.02.2014 Time: 9:30am Location: [address] Dayroom      Group Topic/Focus: Communication, Journalist, newspaper, Team Work  Participation Level: Active - while in session  Participation Quality: Appropriate  Affect: Euthymic  Cognitive: Appropriate    Additional Comments: Activity: Cup Selina Cooley ; Explanation: Patients were asked to create a pyramid out of plastic cups. To build this pyramid patients were given a rubber band with four strings tied to it. Patients were instructed not to touch the cups with their hands and to only pick up the cups with the rubber bands.   Patient initially participated in group activity. Patient became frustrated with activity at approximately 9:35am, patient stated "I can't do this I'm just going to quit." LRT and peers encouraged patient to continue participating in group session. Patient continued to participate for approximately another 5 minutes, at this time patient dropped the string she was holding and walked out of the room. Patient did not return to group session.    [name] Mamta Rimmer, LRT/CTRS  Laiyla Slagel L 12/15/2012 11:49 AM

## 2012-12-15 NOTE — Progress Notes (Signed)
Patient ID: Kathleen [name], female   DOB: Jul 19, 1990, 22 y.o.   MRN: [id number] [facility] MD Progress Note  12/15/2012 1:46 PM Kathleen [name]  MRN:  [id number] Subjective: " I am feeling much better on my current medications." Objective: Patient notes that she just woke up reports that she is feeling fine she will groggy today but reports sleeping great appetite is good depression is 0-1 no suicidal or homicidal ideation reports no auditory or visual hallucinations her anxiety is 1/10 and she's on drowsy secondary to his being woken. Diagnosis:   Axis I: Schizoaffective Disorder Axis II: Deferred Axis III:  Past Medical History  Diagnosis Date  . Headache(784.0)    Axis IV: other psychosocial or environmental problems Axis V: 51-60 moderate symptoms  ADL's:  Intact  Sleep: Fair  Appetite:  Good  Suicidal Ideation:  Pt denies any thoughts, plans, intent of suicide Homicidal Ideation:  Pt denies any thoughts, plans, intent of homicide  AEB (as evidenced by):per pt report and behavior observed on the unit  Psychiatric Specialty Exam: ROS  Blood pressure 114/80, pulse 90, temperature 98 F (36.7 C), temperature source Oral, resp. rate 20, height 5\' 7"  (1.702 m), weight 102.059 kg (225 lb), last menstrual period 10/28/2012, SpO2 99.00%.Body mass index is 35.23 kg/(m^2).  General Appearance: Casual  Eye Contact::  Fair  Speech:  Clear and Coherent  Volume:  Normal  Mood:  euphoric  Affect:  Congruent  Thought Process:  Coherent  Orientation:  Full (Time, Place, and Person)  Thought Content:  WDL  Suicidal Thoughts:  No  Homicidal Thoughts:  No  Memory:  Immediate;   Fair  Judgement:  Fair  Insight:  Fair  Psychomotor Activity:  Normal  Concentration:  Fair  Recall:  Fair  Akathisia:  No  Handed:  Right  AIMS (if indicated):     Assets:  Communication Skills Desire for Improvement  Sleep:  Number of Hours: 6.75   Current Medications: Current  Facility-Administered Medications  Medication Dose Route Frequency Provider Last Rate Last Dose  . acetaminophen (TYLENOL) tablet 650 mg  650 mg Oral Q6H PRN Court Joy, PA-C   650 mg at 12/12/12 2215  . alum & mag hydroxide-simeth (MAALOX/MYLANTA) 200-200-20 MG/5ML suspension 30 mL  30 mL Oral Q4H PRN Court Joy, PA-C      . benztropine (COGENTIN) tablet 1 mg  1 mg Oral Daily Mike Craze, MD   1 mg at 12/13/12 0749  . haloperidol (HALDOL) tablet 5 mg  5 mg Oral BID Mike Craze, MD   5 mg at 12/13/12 0749  . hydrOXYzine (ATARAX/VISTARIL) tablet 50 mg  50 mg Oral QHS PRN,MR X 1 Court Joy, PA-C      . lithium carbonate capsule 300 mg  300 mg Oral QPC supper Mike Craze, MD   300 mg at 12/12/12 1639  . lithium carbonate capsule 600 mg  600 mg Oral QPC breakfast Mike Craze, MD   600 mg at 12/13/12 0749  . magnesium hydroxide (MILK OF MAGNESIA) suspension 30 mL  30 mL Oral Daily PRN Court Joy, PA-C      . traZODone (DESYREL) tablet 50 mg  50 mg Oral QHS PRN,MR X 1 Mike Craze, MD   50 mg at 12/12/12 2215    Lab Results:  Results for orders placed during the hospital encounter of 12/10/12 (from the past 48 hour(s))  LITHIUM LEVEL     Status: Abnormal  Collection Time    12/13/12  7:53 PM      Result Value Range   Lithium Lvl 0.56 (*) 0.80 - 1.40 mEq/L    Physical Findings: AIMS: Facial and Oral Movements Muscles of Facial Expression: None, normal Lips and Perioral Area: None, normal Jaw: None, normal Tongue: None, normal,Extremity Movements Upper (arms, wrists, hands, fingers): None, normal Lower (legs, knees, ankles, toes): None, normal, Trunk Movements Neck, shoulders, hips: None, normal, Overall Severity Severity of abnormal movements (highest score from questions above): None, normal Incapacitation due to abnormal movements: None, normal Patient's awareness of abnormal movements (rate only patient's report): No Awareness, Dental Status Current  problems with teeth and/or dentures?: No Does patient usually wear dentures?: No  CIWA:    COWS:     Treatment Plan Summary: Daily contact with patient to assess and evaluate symptoms and progress in treatment Medication management  Plan: 1. Patient to continue current medication regimen. 2. Lithium level on 12/16/12  Medical Decision Making Problem Points:  Established problem, stable/improving (1), Review of last therapy session (1) and Review of psycho-social stressors (1) Data Points:  Review or order medicine tests (1) Review of medication regiment & side effects (2)  I certify that inpatient services furnished can reasonably be expected to improve the patient's condition.  Rona Ravens. Kandance Yano RPAC 1:47 PM 12/15/2012

## 2012-12-16 MED ORDER — BENZTROPINE MESYLATE 1 MG PO TABS: 1.0000 mg | ORAL_TABLET | Freq: Every day | ORAL | Status: DC

## 2012-12-16 MED ORDER — LITHIUM CARBONATE 300 MG PO CAPS: ORAL_CAPSULE | ORAL | Status: DC

## 2012-12-16 MED ORDER — HALOPERIDOL 5 MG PO TABS: 5.0000 mg | ORAL_TABLET | Freq: Two times a day (BID) | ORAL | Status: DC

## 2012-12-16 NOTE — [facility] Suicide Risk Assessment (Signed)
Suicide Risk Assessment  Discharge Assessment     Demographic Factors:  Gay, lesbian, or bisexual orientation, Low socioeconomic status, Unemployed and female  Mental Status Per Nursing Assessment::   On Admission:  Self-harm thoughts;Self-harm behaviors  Current Mental Status by Physician: patient denies suicidal ideation, intent or plan  Loss Factors: Decrease in vocational status and Financial problems/change in socioeconomic status  Historical Factors: Impulsivity  Risk Reduction Factors:   Sense of responsibility to family, Living with another person, especially a relative and Positive social support  Continued Clinical Symptoms:  Resolving mood symptoms  Cognitive Features That Contribute To Risk:  Closed-mindedness    Suicide Risk:  Minimal: No identifiable suicidal ideation.  Patients presenting with no risk factors but with morbid ruminations; may be classified as minimal risk based on the severity of the depressive symptoms  Discharge Diagnoses:   AXIS I:  Schizoaffective disorder  AXIS II:  Deferred AXIS III:   Past Medical History  Diagnosis Date  . Depression   . Bipolar 1 disorder   . Headache(784.0)    AXIS IV:  other psychosocial or environmental problems and problems related to social environment AXIS V:  61-70 mild symptoms  Plan Of Care/Follow-up recommendations:  Activity:  as tolerated Diet:  healthy Tests:  Lithium level. Current leve is 0.41 Other:  patient to keep her after care appointment  Is patient on multiple antipsychotic therapies at discharge:  No   Has Patient had three or more failed trials of antipsychotic monotherapy by history:  No  Recommended Plan for Multiple Antipsychotic Therapies: N/A  Tyriq Moragne,MD 12/16/2012, 9:56 AM

## 2012-12-16 NOTE — Progress Notes (Signed)
Seen and agreed. Hazelgrace Bonham, MD 

## 2012-12-16 NOTE — Discharge Summary (Signed)
Physician Discharge Summary Note  Patient:  Kathleen [name] is an 22 y.o., female MRN:  [id number] DOB:  28-Dec-1990 Patient phone:  [phone] (home)  Patient address:   [address] [place] [address],   Date of Admission:  12/10/2012 Date of Discharge: 784696  Reason for Admission:  Schizoaffective disorder  Discharge Diagnoses: Active Problems:   * No active hospital problems. *  Review of Systems  Constitutional: Negative.  Negative for fever, chills, weight loss, malaise/fatigue and diaphoresis.  HENT: Negative for congestion and sore throat.   Eyes: Negative for blurred vision, double vision and photophobia.  Respiratory: Negative for cough, shortness of breath and wheezing.   Cardiovascular: Negative for chest pain, palpitations and PND.  Gastrointestinal: Negative for heartburn, nausea, vomiting, abdominal pain, diarrhea and constipation.  Musculoskeletal: Negative for myalgias, joint pain and falls.  Neurological: Negative for dizziness, tingling, tremors, sensory change, speech change, focal weakness, seizures, loss of consciousness, weakness and headaches.  Endo/Heme/Allergies: Negative for polydipsia. Does not bruise/bleed easily.  Psychiatric/Behavioral: Negative for depression, suicidal ideas, hallucinations, memory loss and substance abuse. The patient is not nervous/anxious and does not have insomnia.   Discharge Diagnoses:  AXIS I: Schizoaffective disorder  AXIS II: Deferred  AXIS III:  Past Medical History   Diagnosis  Date   .  Depression    .  Bipolar 1 disorder    .  Headache(784.0)     AXIS IV: other psychosocial or environmental problems and problems related to social environment  AXIS V: 61-70 mild symptoms      Level of Care:  OP  Hospital Course:  Kathleen [name] presented to the hospital via EMS which she called herself stating that she was suicidal and had a knife to her throat earlier in the day. She reported that she had been off of her  medications for some time and wanted her medications adjusted. She noted that she was homeless after being put out of her father and stepmother's home after an argument.      She was evaluated and given medical clearance and transferred to behavioral health for further care and stabilization. Her labs were unremarkable, and her UDS and pregnancy test were [name] negative.      On arrival on the unit the patient admitted to having command hallucinations to stab herself or to stab other people just watch other people bleed. She was pleasant and cooperative, often smiling and dealing, stating that when she was ready for discharge she would like to go to a shelter. The patient noted that her auditory hallucinations were getting worse and her medication was restarted. She was psychotic paranoid and responding to internal stimulation noting that she was afraid of herself and isolated in her room.       Kathleen [name] was evaluated each day by clinical provider to note her progress and response to treatment. Improvement was noted by her affect, behavior, participation in unit programming, as well as her verbal and written response on daily self inquiry. Improvement was also noted with patient's report of decreasing symptoms and improving appetite, sleep, and mood.      Kathleen [name] responded well to medication and symptoms began to decline appropriately. By the day of discharge she was alert and oriented denied suicidal or homicidal ideation and was no longer responding to internal stimulation. She felt that her admission had been to her benefit, and Kathleen [name] noted that she was grateful for the care that she received at behavioral health. She was in much improved  condition and upon arrival, and fat to be stable for discharge with a followup plan as noted below.        Consults:  None  Significant Diagnostic Studies:  labs: Lithium level  Discharge Vitals:   Blood pressure 140/89, pulse 99, temperature 97.4 F (36.3 C), temperature  source Oral, resp. rate 18, height 5\' 7"  (1.702 m), weight 102.059 kg (225 lb), last menstrual period 10/28/2012, SpO2 99.00%. Body mass index is 35.23 kg/(m^2). Lab Results:   Results for orders placed during the hospital encounter of 12/10/12 (from the past 72 hour(s))  LITHIUM LEVEL     Status: Abnormal   Collection Time    12/13/12  7:53 PM      Result Value Range   Lithium Lvl 0.56 (*) 0.80 - 1.40 mEq/L  LITHIUM LEVEL     Status: Abnormal   Collection Time    12/16/12  6:20 AM      Result Value Range   Lithium Lvl 0.41 (*) 0.80 - 1.40 mEq/L    Physical Findings: AIMS: Facial and Oral Movements Muscles of Facial Expression: None, normal Lips and Perioral Area: None, normal Jaw: None, normal Tongue: None, normal,Extremity Movements Upper (arms, wrists, hands, fingers): None, normal Lower (legs, knees, ankles, toes): None, normal, Trunk Movements Neck, shoulders, hips: None, normal, Overall Severity Severity of abnormal movements (highest score from questions above): None, normal Incapacitation due to abnormal movements: None, normal Patient's awareness of abnormal movements (rate only patient's report): No Awareness, Dental Status Current problems with teeth and/or dentures?: No Does patient usually wear dentures?: No  CIWA:    COWS:     Psychiatric Specialty Exam: See Psychiatric Specialty Exam and Suicide Risk Assessment completed by Attending Physician prior to discharge.  Discharge destination:  Home  Is patient on multiple antipsychotic therapies at discharge:  No   Has Patient had three or more failed trials of antipsychotic monotherapy by history:  No  Recommended Plan for Multiple Antipsychotic Therapies: not applicable   Discharge Orders   Future Orders Complete By Expires     Diet - low sodium heart healthy  As directed     Discharge instructions  As directed     Comments:      Take all of your medications as directed. Be sure to keep all of your follow  up appointments.  If you are unable to keep your follow up appointment, call your Doctor's office to let them know, and reschedule.  Make sure that you have enough medication to last until your appointment. Be sure to get plenty of rest. Going to bed at the same time each night will help. Try to avoid sleeping during the day.  Increase your activity as tolerated. Regular exercise will help you to sleep better and improve your mental health. Eating a heart healthy diet is recommended. Try to avoid salty or fried foods. Be sure to avoid all alcohol and illegal drugs.    Increase activity slowly  As directed         Medication List    STOP taking these medications       aspirin 325 MG tablet     risperiDONE 2 MG tablet  Commonly known as:  RISPERDAL      TAKE these medications     Indication   benztropine 1 MG tablet  Commonly known as:  COGENTIN  Take 1 tablet (1 mg total) by mouth daily. To prevent side effects.   Indication:  Extrapyramidal Reaction caused by  Medications     haloperidol 5 MG tablet  Commonly known as:  HALDOL  Take 1 tablet (5 mg total) by mouth 2 (two) times daily.   Indication:  Psychosis, bipolar disorder     lithium carbonate 300 MG capsule  Take one capsule each morning (300mg ) with breakfast, and 2 capsules (600mg ) with dinner each evening.   Indication:  Manic-Depression           Follow-up Information   Follow up with [facility]. (Go to the walk-in clinic M-F between 8 and 9AM for your hospital follow up appointment)    Contact information:   [address]  [place]  [336] [phone]      Follow-up recommendations:   Activities: Resume activity as tolerated. Diet: Heart healthy low sodium diet Tests: Follow up testing will be determined by your out patient provider. Comments:    Total Discharge Time:  Greater than 30 minutes.  Signed: Rona Ravens. Annali Lybrand RPAC 11:22 AM 12/16/2012

## 2012-12-16 NOTE — Progress Notes (Signed)
Patient ID: Kathleen [name], female   DOB: 16-Jan-1991, 22 y.o.   MRN: [id number] Patient discharged today per MD orders.  She received all her personal belongings, prescriptions and medication samples.  Patient denies all SI/HI/AVH.  Patient had a bus pass for transportation.  She had so many personal items that security transported her to the bus station.  Patient left without incident.

## 2012-12-16 NOTE — [facility] Group Notes (Signed)
[facility] LCSW Group Therapy  12/16/2012 1:15 PM  Type of Therapy:  Group Therapy  Participation Level:  Did Not Attend  Horton, Kamir Selover Nicole 12/16/2012, 2:41 PM  

## 2012-12-16 NOTE — Progress Notes (Signed)
[facility] Adult Case Management Discharge Plan :  Will you be returning to the same living situation after discharge: Yes,  returning home At discharge, do you have transportation home?:Yes,  provided bus pass Do you have the ability to pay for your medications:Yes,  access to meds  Release of information consent forms completed and in the chart;  Patient's signature needed at discharge.  Patient to Follow up at: Follow-up Information   Follow up with [facility]. (Go to the walk-in clinic M-F between 8 and 9AM for your hospital follow up appointment)    Contact information:   [address]  [place]  [phone]      Patient denies SI/HI:   Yes,  denies SI/HI    Aeronautical engineer and Suicide Prevention discussed:  Yes,  discussed with pt and pt's boyfriend, see suicide prevention note  Horton, Salome Arnt 12/16/2012, 10:12 AM

## 2012-12-20 NOTE — Progress Notes (Signed)
Patient Discharge Instructions:  After Visit Summary (AVS):   Faxed to:  12/20/12 Discharge Summary Note:   Faxed to:  12/20/12 Psychiatric Admission Assessment Note:   Faxed to:  12/20/12 Suicide Risk Assessment - Discharge Assessment:   Faxed to:  12/20/12 Faxed/Sent to the Next Level Care provider:  12/20/12 Faxed to [facility] @ [phone]  Jerelene Redden, 12/20/2012, 3:49 PM

## 2012-12-27 NOTE — Discharge Summary (Signed)
Seen and agreed. Othman Masur, MD 

## 2013-01-17 ENCOUNTER — Emergency Department (HOSPITAL_COMMUNITY)
Admission: EM | Admit: 2013-01-17 | Discharge: 2013-01-17 | Disposition: A | Discharge status: Home / Self Care | Payer: Self-pay | Attending: Emergency Medicine | Admitting: Emergency Medicine

## 2013-01-17 ENCOUNTER — Encounter (HOSPITAL_COMMUNITY): Payer: Self-pay | Admitting: Emergency Medicine

## 2013-01-17 ENCOUNTER — Emergency Department (HOSPITAL_COMMUNITY): Payer: Self-pay

## 2013-01-17 DIAGNOSIS — Z79899 Other long term (current) drug therapy: Secondary | ICD-10-CM | POA: Insufficient documentation

## 2013-01-17 DIAGNOSIS — A499 Bacterial infection, unspecified: Secondary | ICD-10-CM | POA: Insufficient documentation

## 2013-01-17 DIAGNOSIS — B9689 Other specified bacterial agents as the cause of diseases classified elsewhere: Secondary | ICD-10-CM | POA: Insufficient documentation

## 2013-01-17 DIAGNOSIS — Z3202 Encounter for pregnancy test, result negative: Secondary | ICD-10-CM | POA: Insufficient documentation

## 2013-01-17 DIAGNOSIS — F319 Bipolar disorder, unspecified: Secondary | ICD-10-CM | POA: Insufficient documentation

## 2013-01-17 DIAGNOSIS — N730 Acute parametritis and pelvic cellulitis: Secondary | ICD-10-CM | POA: Insufficient documentation

## 2013-01-17 DIAGNOSIS — N760 Acute vaginitis: Secondary | ICD-10-CM | POA: Insufficient documentation

## 2013-01-17 LAB — URINALYSIS, ROUTINE W REFLEX MICROSCOPIC
Ketones, ur: NEGATIVE mg/dL
Nitrite: NEGATIVE
Specific Gravity, Urine: 1.028 (ref 1.005–1.030)
pH: 6 (ref 5.0–8.0)

## 2013-01-17 LAB — URINE MICROSCOPIC-ADD ON

## 2013-01-17 LAB — CBC WITH DIFFERENTIAL/PLATELET
Basophils Absolute: 0 10*3/uL (ref 0.0–0.1)
Basophils Relative: 0 % (ref 0–1)
Eosinophils Absolute: 0.1 10*3/uL (ref 0.0–0.7)
HCT: 40.8 % (ref 36.0–46.0)
Hemoglobin: 12.9 g/dL (ref 12.0–15.0)
MCH: 25.7 pg — ABNORMAL LOW (ref 26.0–34.0)
MCHC: 31.6 g/dL (ref 30.0–36.0)
Monocytes Absolute: 0.4 10*3/uL (ref 0.1–1.0)
Monocytes Relative: 7 % (ref 3–12)
RDW: 12.6 % (ref 11.5–15.5)

## 2013-01-17 LAB — WET PREP, GENITAL
Trich, Wet Prep: NONE SEEN
Yeast Wet Prep HPF POC: NONE SEEN

## 2013-01-17 LAB — COMPREHENSIVE METABOLIC PANEL
Albumin: 3.8 g/dL (ref 3.5–5.2)
BUN: 8 mg/dL (ref 6–23)
Calcium: 9.5 mg/dL (ref 8.4–10.5)
Creatinine, Ser: 1.01 mg/dL (ref 0.50–1.10)
Total Protein: 7.7 g/dL (ref 6.0–8.3)

## 2013-01-17 LAB — POCT PREGNANCY, URINE: Preg Test, Ur: NEGATIVE

## 2013-01-17 LAB — LIPASE, BLOOD: Lipase: 18 U/L (ref 11–59)

## 2013-01-17 MED ORDER — HYDROCODONE-ACETAMINOPHEN 5-325 MG PO TABS: 1.0000 | ORAL_TABLET | Freq: Four times a day (QID) | ORAL | Status: DC | PRN

## 2013-01-17 MED ORDER — CEFTRIAXONE SODIUM 250 MG IJ SOLR
250.0000 mg | Freq: Once | INTRAMUSCULAR | Status: AC
  Administered 2013-01-17: 250 mg via INTRAMUSCULAR
  Filled 2013-01-17: qty 250

## 2013-01-17 MED ORDER — IOHEXOL 300 MG/ML  SOLN
50.0000 mL | Freq: Once | INTRAMUSCULAR | Status: AC | PRN
  Administered 2013-01-17: 50 mL via ORAL

## 2013-01-17 MED ORDER — ONDANSETRON HCL 4 MG/2ML IJ SOLN
4.0000 mg | Freq: Once | INTRAMUSCULAR | Status: AC
  Administered 2013-01-17: 4 mg via INTRAVENOUS
  Filled 2013-01-17: qty 2

## 2013-01-17 MED ORDER — IOHEXOL 300 MG/ML  SOLN
100.0000 mL | Freq: Once | INTRAMUSCULAR | Status: AC | PRN
  Administered 2013-01-17: 100 mL via INTRAVENOUS

## 2013-01-17 MED ORDER — SODIUM CHLORIDE 0.9 % IV SOLN
1000.0000 mL | Freq: Once | INTRAVENOUS | Status: AC
  Administered 2013-01-17: 1000 mL via INTRAVENOUS

## 2013-01-17 MED ORDER — METRONIDAZOLE 500 MG PO TABS: 500.0000 mg | ORAL_TABLET | Freq: Two times a day (BID) | ORAL | Status: DC

## 2013-01-17 MED ORDER — DOXYCYCLINE HYCLATE 100 MG PO CAPS: 100.0000 mg | ORAL_CAPSULE | Freq: Two times a day (BID) | ORAL | Status: DC

## 2013-01-17 NOTE — ED Notes (Signed)
Pt presenting to ed with c/o abdominal pain with nausea, vomiting pt denies diarrhea at this time

## 2013-01-17 NOTE — Progress Notes (Signed)
Patient confirms that she does not have a pcp or insurance.  [facility] provided patient a list of pcps who accept self pay patients, information about Affordable Adult Care Act and medicaid for insurance, financial assistance in the community such as [employer], list of discount pharmacies and website needymeds.com for medication assistance.  Patient thankful for assistance.

## 2013-01-17 NOTE — ED Provider Notes (Signed)
CSN: [id number]     Arrival date & time 01/17/13  1425 History     First MD Initiated Contact with Patient 01/17/13 1513     Chief Complaint  Patient presents with  . Abdominal Pain   (Consider location/radiation/quality/duration/timing/severity/associated sxs/prior Treatment) HPI Patient presents to the emergency department with diffuse abdominal pain.  The patient, states this started 3 days, ago, but has worsened over that time frame.  Patient, states, that pain starts in her lower abdomen and radiates to her upper abdomen on the left mostly.  The patient denies chest pain, shortness breath, headache, blurred vision, weakness, dizziness, fever, cough, back pain, dysuria, vaginal bleeding, vaginal discharge, or syncope.  Patient, states she did not take any medications prior to arrival.  She states she did have some loose stools yesterday.  Patient, states nothing seems to make her condition, better, but palpation makes her pain, worse. Past Medical History  Diagnosis Date  . Depression   . Bipolar 1 disorder   . [id number](478.2)    Past Surgical History  Procedure Laterality Date  . Bunionectomy      both feet   No family history on file. History  Substance Use Topics  . Smoking status: Never Smoker   . Smokeless tobacco: Not on file  . Alcohol Use: No   OB History   Grav Para Term Preterm Abortions TAB SAB Ect Mult Living                 Review of Systems All other systems negative except as documented in the HPI. All pertinent positives and negatives as reviewed in the HPI. Allergies  Review of patient's allergies indicates no known allergies.  Home Medications   Current Outpatient Rx  Name  Route  Sig  Dispense  Refill  . benztropine (COGENTIN) 1 MG tablet   Oral   Take 1 tablet (1 mg total) by mouth daily. To prevent side effects.   30 tablet   0   . haloperidol (HALDOL) 5 MG tablet   Oral   Take 1 tablet (5 mg total) by mouth 2 (two) times daily.   60  tablet   0   . lithium carbonate 300 MG capsule      Take one capsule each morning (300mg ) with breakfast, and 2 capsules (600mg ) with dinner each evening.   90 capsule   0    BP 117/78  Pulse 93  Temp(Src) 98.2 F (36.8 C) (Oral)  Resp 16  SpO2 99%  LMP 12/28/2012 Physical Exam  Nursing note and vitals reviewed. Constitutional: She is oriented to person, place, and time. She appears well-developed and well-nourished.  HENT:  Head: Normocephalic and atraumatic.  Mouth/Throat: Oropharynx is clear and moist.  Eyes: Pupils are equal, round, and reactive to light.  Neck: Normal range of motion. Neck supple.  Cardiovascular: Normal rate, regular rhythm and normal heart sounds.  Exam reveals no gallop and no friction rub.   No murmur heard. Pulmonary/Chest: Effort normal and breath sounds normal. No respiratory distress.  Abdominal: Soft. Normal appearance and bowel sounds are normal. She exhibits no distension. There is tenderness. There is no rebound and no guarding.    Genitourinary: Uterus normal. There is no rash, tenderness or lesion on the right labia. There is no rash, tenderness or lesion on the left labia. Cervix exhibits motion tenderness. Right adnexum displays tenderness. Left adnexum displays tenderness. No erythema, tenderness or bleeding around the vagina. No foreign body around the vagina. No  signs of injury around the vagina. Vaginal discharge found.  Neurological: She is alert and oriented to person, place, and time. She exhibits normal muscle tone. Coordination normal.  Skin: Skin is warm and dry. No rash noted.    ED Course   Procedures (including critical care time)  Labs Reviewed  WET PREP, GENITAL - Abnormal; Notable for the following:    Clue Cells Wet Prep HPF POC MANY (*)    WBC, Wet Prep HPF POC FEW (*)    All other components within normal limits  CBC WITH DIFFERENTIAL - Abnormal; Notable for the following:    MCH 25.7 (*)    All other components  within normal limits  COMPREHENSIVE METABOLIC PANEL - Abnormal; Notable for the following:    Glucose, Bld 122 (*)    GFR calc non Af Amer 78 (*)    All other components within normal limits  URINALYSIS, ROUTINE W REFLEX MICROSCOPIC - Abnormal; Notable for the following:    APPearance CLOUDY (*)    Leukocytes, UA SMALL (*)    All other components within normal limits  GC/CHLAMYDIA PROBE AMP  LIPASE, BLOOD  URINE MICROSCOPIC-ADD ON  POCT PREGNANCY, URINE   Ct Abdomen Pelvis W Contrast  01/17/2013   *RADIOLOGY REPORT*  Clinical Data: Left abdominal pain with nausea and vomiting for 1 day.  CT ABDOMEN AND PELVIS WITH CONTRAST  Technique:  Multidetector CT imaging of the abdomen and pelvis was performed following the standard protocol during bolus administration of intravenous contrast.  Contrast: OMNIPAQUE IOHEXOL 300 MG/ML  SOLN, 50mL OMNIPAQUE IOHEXOL 300 MG/ML  SOLN  Comparison: None.  Findings: There is a possible 3 mm right lower lobe nodule on image #1 of series 6.  The lung bases are otherwise clear.  There is no pleural or pericardial effusion.  Images through the mid abdomen and false pelvis are mildly motion degraded (the patient vomited during the scan).  The liver, gallbladder, biliary system and pancreas appear normal.  The spleen, adrenal glands and kidneys appear normal.  There is minimal enteric contrast.  There is no bowel distension or wall thickening.  The appendix is not well seen in part due to the motion.  No pericecal inflammatory changes are identified.  The uterus, ovaries and bladder appear normal.  There is a prominent left perirectal lymph node measuring 8 mm on image 69. No surrounding inflammatory changes are seen.  Small inguinal lymph nodes bilaterally are not pathologically enlarged.  IMPRESSION:  1.  No acute abdominal pelvic findings identified. 2.  Examination is mildly motion degraded. 3.  Prominent left perirectal lymph node of undetermined etiology. 4.   Possible 3 mm right lower lobe pulmonary nodule, likely benign in a patient of this age.   Original Report Authenticated By: Carey Bullocks, M.D.   Patient retreated for PID, based on her pelvic exam.  She does have many clue cells as well.  Patient be referred to the [facility] hospital clinic for further evaluation and care.  She is told to return here as needed.  Patient been stable during her emergency room visit  MDM  MDM Reviewed: nursing note, vitals and previous chart Interpretation: labs      Carlyle Dolly, PA-C 01/17/13 1834

## 2013-01-18 NOTE — ED Provider Notes (Signed)
Medical screening examination/treatment/procedure(s) were performed by non-physician practitioner and as supervising physician I was immediately available for consultation/collaboration.   Claudean Kinds, MD 01/18/13 [phone]

## 2013-01-19 LAB — GC/CHLAMYDIA PROBE AMP
CT Probe RNA: POSITIVE — AB
GC Probe RNA: NEGATIVE

## 2013-01-20 ENCOUNTER — Telehealth (HOSPITAL_COMMUNITY): Payer: Self-pay | Admitting: Emergency Medicine

## 2013-01-20 NOTE — ED Notes (Signed)
Patient has +Chlamydia. 

## 2013-01-20 NOTE — ED Notes (Signed)
+  Chlamydia. Patient treated with Rocephin and Doxycycline. [employer] faxed. 

## 2013-01-23 ENCOUNTER — Telehealth (HOSPITAL_COMMUNITY): Payer: Self-pay | Admitting: Emergency Medicine

## 2013-02-10 ENCOUNTER — Encounter (HOSPITAL_COMMUNITY): Payer: Self-pay | Admitting: Emergency Medicine

## 2013-02-10 ENCOUNTER — Emergency Department (HOSPITAL_COMMUNITY)
Admission: EM | Admit: 2013-02-10 | Discharge: 2013-02-10 | Discharge status: Against Medical Advice | Payer: No Typology Code available for payment source | Attending: Emergency Medicine | Admitting: Emergency Medicine

## 2013-02-10 DIAGNOSIS — N898 Other specified noninflammatory disorders of vagina: Secondary | ICD-10-CM | POA: Insufficient documentation

## 2013-02-10 NOTE — ED Notes (Addendum)
Pt leaving AMA.  Pt stated she wanted to go to [facility], "that's where my people are."  Unable to convince pt to stay and be seen.

## 2013-02-10 NOTE — ED Notes (Signed)
Onset today vaginal bleeding 4 pads since 0600.  States history of having an irregular cycle LMP 02/07/2013 with a possibility of pregnancy. Nausea and emesis for 3 days. Alert answering and following commands.

## 2013-02-26 ENCOUNTER — Emergency Department (HOSPITAL_COMMUNITY)
Admission: EM | Admit: 2013-02-26 | Discharge: 2013-02-26 | Disposition: A | Discharge status: Home / Self Care | Payer: No Typology Code available for payment source | Attending: Emergency Medicine | Admitting: Emergency Medicine

## 2013-02-26 ENCOUNTER — Encounter (HOSPITAL_COMMUNITY): Payer: Self-pay | Admitting: Emergency Medicine

## 2013-02-26 DIAGNOSIS — L230 Allergic contact dermatitis due to metals: Secondary | ICD-10-CM

## 2013-02-26 DIAGNOSIS — Z79899 Other long term (current) drug therapy: Secondary | ICD-10-CM | POA: Insufficient documentation

## 2013-02-26 DIAGNOSIS — F319 Bipolar disorder, unspecified: Secondary | ICD-10-CM | POA: Insufficient documentation

## 2013-02-26 DIAGNOSIS — Z8619 Personal history of other infectious and parasitic diseases: Secondary | ICD-10-CM | POA: Insufficient documentation

## 2013-02-26 HISTORY — DX: Unspecified sexually transmitted disease: A64

## 2013-02-26 MED ORDER — CLOTRIMAZOLE-BETAMETHASONE 1-0.05 % EX CREA: TOPICAL_CREAM | CUTANEOUS | Status: DC

## 2013-02-26 MED ORDER — LORATADINE 10 MG PO TABS
10.0000 mg | ORAL_TABLET | Freq: Once | ORAL | Status: AC
  Administered 2013-02-26: 10 mg via ORAL
  Filled 2013-02-26: qty 1

## 2013-02-26 NOTE — ED Provider Notes (Signed)
Medical screening examination/treatment/procedure(s) were performed by non-physician practitioner and as supervising physician I was immediately available for consultation/collaboration.  Juliocesar Blasius F Eldred Lievanos, MD 02/26/13 2024 

## 2013-02-26 NOTE — ED Notes (Signed)
Pt reports an itching rash on back of neck x 2 days Pt is concerned about a possible STD. Does not want to be seen for that issue today.

## 2013-02-26 NOTE — ED Provider Notes (Signed)
CSN: [id number]     Arrival date & time 02/26/13  1314 History   First MD Initiated Contact with Patient 02/26/13 1315     Chief Complaint  Patient presents with  . Rash    rash on back x 1 week   (Consider location/radiation/quality/duration/timing/severity/associated sxs/prior Treatment) Patient is a 22 y.o. female presenting with rash. The history is provided by the patient. No language interpreter was used.  Rash Location:  Head/neck Head/neck rash location:  L neck and R neck Associated symptoms: no fever   Associated symptoms comment:  Rash to fold of skin on posterior neck for several days. It itches and burns. No drainage.    Past Medical History  Diagnosis Date  . Depression   . Bipolar 1 disorder   . Headache(784.0)   . STD (female)    Past Surgical History  Procedure Laterality Date  . Bunionectomy      both feet   Family History  Problem Relation Age of Onset  . Diabetes Mother   . Hypertension Mother    History  Substance Use Topics  . Smoking status: Never Smoker   . Smokeless tobacco: Not on file  . Alcohol Use: No   OB History   Grav Para Term Preterm Abortions TAB SAB Ect Mult Living                 Review of Systems  Constitutional: Negative for fever and chills.  Skin: Positive for rash.    Allergies  Other  Home Medications   Current Outpatient Rx  Name  Route  Sig  Dispense  Refill  . acetaminophen (TYLENOL) 500 MG tablet   Oral   Take 1,000 mg by mouth every 6 (six) hours as needed for pain.         . benztropine (COGENTIN) 1 MG tablet   Oral   Take 1 tablet (1 mg total) by mouth daily. To prevent side effects.   30 tablet   0   . haloperidol (HALDOL) 5 MG tablet   Oral   Take 1 tablet (5 mg total) by mouth 2 (two) times daily.   60 tablet   0   . lithium carbonate 300 MG capsule      Take one capsule each morning (300mg ) with breakfast, and 2 capsules (600mg ) with dinner each evening.   90 capsule   0    BP  138/89  Pulse 95  Temp(Src) 98.7 F (37.1 C) (Oral)  Resp 14  SpO2 99%  LMP 01/28/2013 Physical Exam  Constitutional: She is oriented to person, place, and time. She appears well-developed and well-nourished.  Neck: Normal range of motion.  Pulmonary/Chest: Effort normal.  Neurological: She is alert and oriented to person, place, and time.  Skin: Skin is warm and dry.  Plaque type rash posterior neck following line of natural skin fold. No drainage. Nontender. No induration.     ED Course  Procedures (including critical care time) Labs Review Labs Reviewed - No data to display Imaging Review No results found.  MDM  No diagnosis found. 1. Allergic dermatitis  Rash follows pattern along necklace line. She reports wearing a nickel containing chain with a previous reaction to nickel. Findings c/w allergic type reaction.    Arnoldo Hooker, PA-C 02/26/13 1435

## 2013-06-13 ENCOUNTER — Encounter (HOSPITAL_COMMUNITY): Payer: Self-pay | Admitting: Emergency Medicine

## 2013-06-13 ENCOUNTER — Emergency Department (HOSPITAL_COMMUNITY)
Admission: EM | Admit: 2013-06-13 | Discharge: 2013-06-13 | Disposition: A | Discharge status: Home / Self Care | Payer: No Typology Code available for payment source | Attending: Emergency Medicine | Admitting: Emergency Medicine

## 2013-06-13 DIAGNOSIS — I10 Essential (primary) hypertension: Secondary | ICD-10-CM | POA: Insufficient documentation

## 2013-06-13 DIAGNOSIS — Z79899 Other long term (current) drug therapy: Secondary | ICD-10-CM | POA: Insufficient documentation

## 2013-06-13 DIAGNOSIS — F209 Schizophrenia, unspecified: Secondary | ICD-10-CM | POA: Insufficient documentation

## 2013-06-13 DIAGNOSIS — IMO0002 Reserved for concepts with insufficient information to code with codable children: Secondary | ICD-10-CM | POA: Insufficient documentation

## 2013-06-13 DIAGNOSIS — J069 Acute upper respiratory infection, unspecified: Secondary | ICD-10-CM | POA: Insufficient documentation

## 2013-06-13 DIAGNOSIS — Z8619 Personal history of other infectious and parasitic diseases: Secondary | ICD-10-CM | POA: Insufficient documentation

## 2013-06-13 DIAGNOSIS — R000 Tachycardia, unspecified: Secondary | ICD-10-CM | POA: Insufficient documentation

## 2013-06-13 DIAGNOSIS — R6889 Other general symptoms and signs: Secondary | ICD-10-CM

## 2013-06-13 DIAGNOSIS — F3130 Bipolar disorder, current episode depressed, mild or moderate severity, unspecified: Secondary | ICD-10-CM | POA: Insufficient documentation

## 2013-06-13 DIAGNOSIS — H9209 Otalgia, unspecified ear: Secondary | ICD-10-CM | POA: Insufficient documentation

## 2013-06-13 DIAGNOSIS — J111 Influenza due to unidentified influenza virus with other respiratory manifestations: Secondary | ICD-10-CM | POA: Insufficient documentation

## 2013-06-13 HISTORY — DX: Essential (primary) hypertension: I10

## 2013-06-13 HISTORY — DX: Schizophrenia, unspecified: F20.9

## 2013-06-13 MED ORDER — ALBUTEROL SULFATE HFA 108 (90 BASE) MCG/ACT IN AERS
2.0000 | INHALATION_SPRAY | Freq: Once | RESPIRATORY_TRACT | Status: AC
  Administered 2013-06-13: 2 via RESPIRATORY_TRACT
  Filled 2013-06-13: qty 6.7

## 2013-06-13 MED ORDER — FLUTICASONE PROPIONATE 50 MCG/ACT NA SUSP: 2.0000 | Freq: Every day | NASAL | Status: DC

## 2013-06-13 NOTE — ED Provider Notes (Signed)
Medical screening examination/treatment/procedure(s) were performed by non-physician practitioner and as supervising physician I was immediately available for consultation/collaboration.  EKG Interpretation   None         Richelle Glick B. Adira Limburg, MD 06/13/13 1529 

## 2013-06-13 NOTE — ED Notes (Signed)
Patient states started having cold like symptoms on Friday of last week.  Patient states nasal congestion, cough, dental pain, and ear pain.

## 2013-06-13 NOTE — ED Provider Notes (Signed)
CSN: [id number]     Arrival date & time 06/13/13  1322 History   First MD Initiated Contact with Patient 06/13/13 1355    This chart was scribed for Clinton Sawyer, a non-physician practitioner working with Leonette Most B. Bernette Mayers, MD by Lewanda Rife, ED Scribe. This patient was seen in room TR08C/TR08C and the patient's care was started at 2:58 PM     Chief Complaint  Patient presents with  . Otalgia  . Nasal Congestion  . Cough   (Consider location/radiation/quality/duration/timing/severity/associated sxs/prior Treatment) The history is provided by the patient. No language interpreter was used.   HPI Comments: Kathleen [name] is a 22 y.o. female who presents to the Emergency Department complaining of constant moderate bilateral otalgia onset 4 days. Reports associated nasal congestion, productive cough, sore throat, and  generalized myalgias. Denies any aggravating factors. Reports symptoms are mildly alleviated by showers, mucinex, orajel, and steam. Denies associated fever, chills, and shortness of breath.  Past Medical History  Diagnosis Date  . Depression   . Bipolar 1 disorder   . Headache(784.0)   . STD (female)   . Hypertension   . Schizophrenia    Past Surgical History  Procedure Laterality Date  . Bunionectomy      both feet   Family History  Problem Relation Age of Onset  . Diabetes Mother   . Hypertension Mother    History  Substance Use Topics  . Smoking status: Never Smoker   . Smokeless tobacco: Not on file  . Alcohol Use: No   OB History   Grav Para Term Preterm Abortions TAB SAB Ect Mult Living                 Review of Systems  Respiratory: Positive for cough.    A complete 10 system review of systems was obtained and all systems are negative except as noted in the HPI and PMHx.    Allergies  Other  Home Medications   Current Outpatient Rx  Name  Route  Sig  Dispense  Refill  . ibuprofen (ADVIL,MOTRIN) 200 MG tablet   Oral    Take 400 mg by mouth every 6 (six) hours as needed for mild pain.         . benztropine (COGENTIN) 1 MG tablet   Oral   Take 1 tablet (1 mg total) by mouth daily. To prevent side effects.   30 tablet   0   . fluticasone (FLONASE) 50 MCG/ACT nasal spray   Each Nare   Place 2 sprays into both nostrils daily.   16 g   0   . haloperidol (HALDOL) 5 MG tablet   Oral   Take 1 tablet (5 mg total) by mouth 2 (two) times daily.   60 tablet   0   . lithium carbonate 300 MG capsule      Take one capsule each morning (300mg ) with breakfast, and 2 capsules (600mg ) with dinner each evening.   90 capsule   0    BP 118/78  Pulse 102  Temp(Src) 99 F (37.2 C) (Oral)  Resp 18  Ht 5\' 8"  (1.727 m)  Wt 225 lb (102.059 kg)  BMI 34.22 kg/m2  SpO2 92%  LMP 06/06/2013 Physical Exam  Nursing note and vitals reviewed. Constitutional: She is oriented to person, place, and time. She appears well-developed and well-nourished. No distress.  HENT:  Head: Normocephalic and atraumatic.  Right Ear: Tympanic membrane, external ear and ear canal normal.  Left  Ear: Tympanic membrane, external ear and ear canal normal.  Nose: Mucosal edema and rhinorrhea present.  Mouth/Throat: Uvula is midline and mucous membranes are normal. Mucous membranes are not dry. Posterior oropharyngeal erythema present. No oropharyngeal exudate or posterior oropharyngeal edema.  Eyes: Conjunctivae and EOM are normal.  Neck: Neck supple. No tracheal deviation present.  Cardiovascular: Regular rhythm.  Tachycardia present.   Tachycardia ~100   Pulmonary/Chest: Effort normal and breath sounds normal. No respiratory distress. She has no wheezes. She has no rales.  Musculoskeletal: Normal range of motion.  Neurological: She is alert and oriented to person, place, and time.  Skin: Skin is warm and dry.  Psychiatric: She has a normal mood and affect. Her behavior is normal.    ED Course  Procedures  COORDINATION OF  CARE:  Nursing notes reviewed. Vital signs reviewed. Initial pt interview and examination performed.   2:58 PM-Discussed work up plan with pt at bedside. Pt agrees with plan. 2:58 PM Oxygen saturation is 99% on room air, normal by my interpretation.     Treatment plan initiated: Medications  albuterol (PROVENTIL HFA;VENTOLIN HFA) 108 (90 BASE) MCG/ACT inhaler 2 puff (not administered)     Initial diagnostic testing ordered.    Labs Review Labs Reviewed - No data to display Imaging Review No results found.  EKG Interpretation   None       MDM   1. Flu-like symptoms   2. URI (upper respiratory infection)    Pt is well appearing and in NAD, low-grade fever of 99, mild tachycardia. Lungs clear. Discussed symptomatic treatment. Stable for discharge. Return precautions given. Patient states understanding of treatment care plan and is agreeable.   I personally performed the services described in this documentation, which was scribed in my presence. The recorded information has been reviewed and is accurate.    Trevor Mace, PA-C 06/13/13 1500

## 2013-06-14 ENCOUNTER — Emergency Department (HOSPITAL_COMMUNITY)
Admission: EM | Admit: 2013-06-14 | Discharge: 2013-06-15 | Disposition: A | Discharge status: Home / Self Care | Payer: No Typology Code available for payment source | Attending: Emergency Medicine | Admitting: Emergency Medicine

## 2013-06-14 DIAGNOSIS — Z8619 Personal history of other infectious and parasitic diseases: Secondary | ICD-10-CM | POA: Insufficient documentation

## 2013-06-14 DIAGNOSIS — R040 Epistaxis: Secondary | ICD-10-CM | POA: Insufficient documentation

## 2013-06-14 DIAGNOSIS — F319 Bipolar disorder, unspecified: Secondary | ICD-10-CM | POA: Insufficient documentation

## 2013-06-14 DIAGNOSIS — Z79899 Other long term (current) drug therapy: Secondary | ICD-10-CM | POA: Insufficient documentation

## 2013-06-14 DIAGNOSIS — J111 Influenza due to unidentified influenza virus with other respiratory manifestations: Secondary | ICD-10-CM | POA: Insufficient documentation

## 2013-06-14 DIAGNOSIS — I10 Essential (primary) hypertension: Secondary | ICD-10-CM | POA: Insufficient documentation

## 2013-06-14 DIAGNOSIS — F209 Schizophrenia, unspecified: Secondary | ICD-10-CM | POA: Insufficient documentation

## 2013-06-14 DIAGNOSIS — IMO0002 Reserved for concepts with insufficient information to code with codable children: Secondary | ICD-10-CM | POA: Insufficient documentation

## 2013-06-15 ENCOUNTER — Encounter (HOSPITAL_COMMUNITY): Payer: Self-pay | Admitting: Emergency Medicine

## 2013-06-15 MED ORDER — ONDANSETRON 8 MG PO TBDP: ORAL_TABLET | ORAL | Status: DC

## 2013-06-15 MED ORDER — GUAIFENESIN ER 600 MG PO TB12: 1200.0000 mg | ORAL_TABLET | Freq: Two times a day (BID) | ORAL | Status: DC

## 2013-06-15 MED ORDER — ONDANSETRON 8 MG PO TBDP
8.0000 mg | ORAL_TABLET | Freq: Once | ORAL | Status: AC
  Administered 2013-06-15: 8 mg via ORAL
  Filled 2013-06-15: qty 1

## 2013-06-15 MED ORDER — HYDROCOD POLST-CHLORPHEN POLST 10-8 MG/5ML PO LQCR: 5.0000 mL | Freq: Two times a day (BID) | ORAL | Status: DC

## 2013-06-15 NOTE — ED Notes (Signed)
Pt states she was dx with flu at [facility] and since then she has been throwing up blood clots, and she has a nose bleed. Pt given ice pack. Still actively bleeding.

## 2013-06-15 NOTE — ED Provider Notes (Signed)
CSN: [id number]     Arrival date & time 06/14/13  2345 History   First MD Initiated Contact with Patient 06/15/13 0121     Chief Complaint  Patient presents with  . Epistaxis  . Influenza   HPI  History provided by the patient. Patient is a 22 year old female presenting with right nose bleed. Patient was recently seen for flulike symptoms with fever, congestion, cough and nausea, vomiting and diarrhea symptoms. She states she's been resting well but earlier today he began having a right nosebleed after blowing her nose several times. She had difficulty stopping the bleeding. Some of the blood was getting into the back of the throat. Patient has tried to pinch her nose and use tissue paper. No other aggravating or alleviating factors. Denies any other symptoms.   Past Medical History  Diagnosis Date  . Depression   . Bipolar 1 disorder   . Headache(784.0)   . STD (female)   . Hypertension   . Schizophrenia    Past Surgical History  Procedure Laterality Date  . Bunionectomy      both feet   Family History  Problem Relation Age of Onset  . Diabetes Mother   . Hypertension Mother    History  Substance Use Topics  . Smoking status: Never Smoker   . Smokeless tobacco: Not on file  . Alcohol Use: No   OB History   Grav Para Term Preterm Abortions TAB SAB Ect Mult Living                 Review of Systems  Constitutional: Positive for fever, chills and fatigue.  HENT: Positive for congestion and nosebleeds.   Respiratory: Positive for cough. Negative for shortness of breath.   Cardiovascular: Negative for chest pain.  Gastrointestinal: Positive for nausea, vomiting and diarrhea. Negative for abdominal pain.  Musculoskeletal: Positive for myalgias.  All other systems reviewed and are negative.    Allergies  Other  Home Medications   Current Outpatient Rx  Name  Route  Sig  Dispense  Refill  . Acetaminophen-Guaifenesin (THERAFLU FLU/CHEST CONGESTION PO)   Oral  Take 30 mLs by mouth every 12 (twelve) hours.         . benztropine (COGENTIN) 1 MG tablet   Oral   Take 1 tablet (1 mg total) by mouth daily. To prevent side effects.   30 tablet   0   . fluticasone (FLONASE) 50 MCG/ACT nasal spray   Each Nare   Place 2 sprays into both nostrils daily.   16 g   0   . haloperidol (HALDOL) 5 MG tablet   Oral   Take 1 tablet (5 mg total) by mouth 2 (two) times daily.   60 tablet   0   . lithium carbonate 300 MG capsule      Take one capsule each morning (300mg ) with breakfast, and 2 capsules (600mg ) with dinner each evening.   90 capsule   0   . ibuprofen (ADVIL,MOTRIN) 200 MG tablet   Oral   Take 400 mg by mouth every 6 (six) hours as needed for mild pain.          BP 138/89  Pulse 112  Temp(Src) 97.9 F (36.6 C) (Oral)  SpO2 95%  LMP 06/06/2013 Physical Exam  Nursing note and vitals reviewed. Constitutional: She is oriented to person, place, and time. She appears well-developed and well-nourished. No distress.  HENT:  Head: Normocephalic.  Nose: Epistaxis is observed.  Mouth/Throat: Oropharynx  is clear and moist.  Right epistaxis. No polyps or septal hematomas.  Neck: Normal range of motion. Neck supple.  Cardiovascular: Normal rate and regular rhythm.   Pulmonary/Chest: Effort normal and breath sounds normal. No respiratory distress. She has no wheezes. She has no rales.  Abdominal: Soft.  Neurological: She is alert and oriented to person, place, and time.  Skin: Skin is warm and dry. No rash noted.  Psychiatric: She has a normal mood and affect. Her behavior is normal.    ED Course  EPISTAXIS MANAGEMENT Date/Time: 06/15/2013 2:00 AM Performed by: Angus Seller Authorized by: Ivonne Andrew S Risks and benefits: risks, benefits and alternatives were discussed Consent given by: patient Patient identity confirmed: verbally with patient Time out: Immediately prior to procedure a "time out" was called to verify the  correct patient, procedure, equipment, support staff and site/side marked as required. Treatment site: right Kiesselbach's area Repair method: silver nitrate Post-procedure assessment: bleeding stopped Treatment complexity: simple Patient tolerance: Patient tolerated the procedure well with no immediate complications.     DIAGNOSTIC STUDIES: Oxygen Saturation is 95% on room air.    COORDINATION OF CARE:  Nursing notes reviewed. Vital signs reviewed. Initial pt interview and examination performed.   2:17 AM-patient seen and evaluated. Patient appears well. She has continued slight bleeding from the right nostril.   Patient had good slowing of bleeding after phenylephrine spray. There was a single blood clot to the right anterior septal area. This was successfully cauterized.  Treatment plan initiated: Medications  ondansetron (ZOFRAN-ODT) disintegrating tablet 8 mg (8 mg Oral Given 06/15/13 0119)     MDM   1. Epistaxis   2. Influenza        Angus Seller, PA-C 06/15/13 0222

## 2013-06-16 NOTE — ED Provider Notes (Signed)
Medical screening examination/treatment/procedure(s) were performed by non-physician practitioner and as supervising physician I was immediately available for consultation/collaboration.    Teressa Lower, MD 06/16/13 1002

## 2013-06-30 ENCOUNTER — Encounter (HOSPITAL_COMMUNITY): Payer: Self-pay | Admitting: Emergency Medicine

## 2013-06-30 ENCOUNTER — Emergency Department (HOSPITAL_COMMUNITY)
Admission: EM | Admit: 2013-06-30 | Discharge: 2013-06-30 | Disposition: A | Discharge status: Home / Self Care | Payer: Self-pay | Attending: Emergency Medicine | Admitting: Emergency Medicine

## 2013-06-30 ENCOUNTER — Emergency Department (HOSPITAL_COMMUNITY): Payer: Self-pay

## 2013-06-30 DIAGNOSIS — F319 Bipolar disorder, unspecified: Secondary | ICD-10-CM | POA: Insufficient documentation

## 2013-06-30 DIAGNOSIS — F209 Schizophrenia, unspecified: Secondary | ICD-10-CM | POA: Insufficient documentation

## 2013-06-30 DIAGNOSIS — R042 Hemoptysis: Secondary | ICD-10-CM | POA: Insufficient documentation

## 2013-06-30 DIAGNOSIS — R059 Cough, unspecified: Secondary | ICD-10-CM | POA: Insufficient documentation

## 2013-06-30 DIAGNOSIS — IMO0002 Reserved for concepts with insufficient information to code with codable children: Secondary | ICD-10-CM | POA: Insufficient documentation

## 2013-06-30 DIAGNOSIS — Z79899 Other long term (current) drug therapy: Secondary | ICD-10-CM | POA: Insufficient documentation

## 2013-06-30 DIAGNOSIS — K920 Hematemesis: Secondary | ICD-10-CM | POA: Insufficient documentation

## 2013-06-30 DIAGNOSIS — B349 Viral infection, unspecified: Secondary | ICD-10-CM

## 2013-06-30 DIAGNOSIS — I10 Essential (primary) hypertension: Secondary | ICD-10-CM | POA: Insufficient documentation

## 2013-06-30 DIAGNOSIS — R05 Cough: Secondary | ICD-10-CM | POA: Insufficient documentation

## 2013-06-30 DIAGNOSIS — Z8619 Personal history of other infectious and parasitic diseases: Secondary | ICD-10-CM | POA: Insufficient documentation

## 2013-06-30 DIAGNOSIS — R0602 Shortness of breath: Secondary | ICD-10-CM | POA: Insufficient documentation

## 2013-06-30 DIAGNOSIS — B9789 Other viral agents as the cause of diseases classified elsewhere: Secondary | ICD-10-CM | POA: Insufficient documentation

## 2013-06-30 LAB — CBC WITH DIFFERENTIAL/PLATELET
Basophils Absolute: 0 10*3/uL (ref 0.0–0.1)
Basophils Relative: 0 % (ref 0–1)
EOS ABS: 0.1 10*3/uL (ref 0.0–0.7)
EOS PCT: 1 % (ref 0–5)
HEMATOCRIT: 35.8 % — AB (ref 36.0–46.0)
Hemoglobin: 11.6 g/dL — ABNORMAL LOW (ref 12.0–15.0)
LYMPHS ABS: 2.6 10*3/uL (ref 0.7–4.0)
LYMPHS PCT: 35 % (ref 12–46)
MCH: 26.4 pg (ref 26.0–34.0)
MCHC: 32.4 g/dL (ref 30.0–36.0)
MCV: 81.4 fL (ref 78.0–100.0)
MONO ABS: 0.5 10*3/uL (ref 0.1–1.0)
Monocytes Relative: 7 % (ref 3–12)
Neutro Abs: 4.3 10*3/uL (ref 1.7–7.7)
Neutrophils Relative %: 57 % (ref 43–77)
PLATELETS: 238 10*3/uL (ref 150–400)
RBC: 4.4 MIL/uL (ref 3.87–5.11)
RDW: 12.5 % (ref 11.5–15.5)
WBC: 7.5 10*3/uL (ref 4.0–10.5)

## 2013-06-30 LAB — BASIC METABOLIC PANEL
BUN: 6 mg/dL (ref 6–23)
CALCIUM: 8.9 mg/dL (ref 8.4–10.5)
CO2: 25 meq/L (ref 19–32)
CREATININE: 0.97 mg/dL (ref 0.50–1.10)
Chloride: 102 mEq/L (ref 96–112)
GFR calc Af Amer: >90 mL/min (ref >90)
GFR, EST NON AFRICAN AMERICAN: 82 mL/min — AB (ref >90)
GLUCOSE: 96 mg/dL (ref 70–99)
Potassium: 4 mEq/L (ref 3.7–5.3)
SODIUM: 138 meq/L (ref 137–147)

## 2013-06-30 LAB — D-DIMER, QUANTITATIVE: D-Dimer, Quant: <0.27 ug/mL-FEU (ref 0.00–0.48)

## 2013-06-30 MED ORDER — DEXTROMETHORPHAN POLISTIREX 30 MG/5ML PO LQCR: 15.0000 mg | ORAL | Status: DC | PRN

## 2013-06-30 NOTE — ED Provider Notes (Signed)
Medical screening examination/treatment/procedure(s) were conducted as a shared visit with non-physician practitioner(s) and myself.  I personally evaluated the patient during the encounter.  EKG Interpretation    Date/Time:  Thursday June 30 2013 16:55:55 EST Ventricular Rate:  86 PR Interval:  122 QRS Duration: 84 QT Interval:  374 QTC Calculation: 447 R Axis:   31 Text Interpretation:  Sinus rhythm Borderline repolarization abnormality Baseline wander in lead(s) V3 V4 V5 V6 Confirmed by Nevea Spiewak  MD, Trip Cavanagh ([id number]) on 06/30/2013 6:18:35 PM           Patient here with cough and congestion x1 week worse today. No leg pain or swelling. D-dimer here is negative. Chest x-ray normal. Suspect by her illness is viral and she is stable for discharge  Leota Jacobsen, MD 06/30/13 1846

## 2013-06-30 NOTE — ED Notes (Signed)
Pt reports having the flu, which she had over a week ago. Pt now reporting chest pain, which started over one week ago. Pt also reporting hematemesis, which she reports being bright red. Pt also reports bilateral nose bleed. Pt reports seeing blood clots in her emesis and nose bleed drainage. Pt reports a productive cough with yellow and green mucus.  Pt is A/O x 4 and in NAD. Vital signs are WDL.

## 2013-06-30 NOTE — Discharge Instructions (Signed)
Cough, Adult  A cough is a reflex that helps clear your throat and airways. It can help heal the body or may be a reaction to an irritated airway. A cough may only last 2 or 3 weeks (acute) or may last more than 8 weeks (chronic).  CAUSES Acute cough:  Viral or bacterial infections. Chronic cough:  Infections.  Allergies.  Asthma.  Post-nasal drip.  Smoking.  Heartburn or acid reflux.  Some medicines.  Chronic lung problems (COPD).  Cancer. SYMPTOMS   Cough.  Fever.  Chest pain.  Increased breathing rate.  High-pitched whistling sound when breathing (wheezing).  Colored mucus that you cough up (sputum). TREATMENT   A bacterial cough may be treated with antibiotic medicine.  A viral cough must run its course and will not respond to antibiotics.  Your caregiver may recommend other treatments if you have a chronic cough. HOME CARE INSTRUCTIONS   Only take over-the-counter or prescription medicines for pain, discomfort, or fever as directed by your caregiver. Use cough suppressants only as directed by your caregiver.  Use a cold steam vaporizer or humidifier in your bedroom or home to help loosen secretions.  Sleep in a semi-upright position if your cough is worse at night.  Rest as needed.  Stop smoking if you smoke. SEEK IMMEDIATE MEDICAL CARE IF:   You have pus in your sputum.  Your cough starts to worsen.  You cannot control your cough with suppressants and are losing sleep.  You begin coughing up blood.  You have difficulty breathing.  You develop pain which is getting worse or is uncontrolled with medicine.  You have a fever. MAKE SURE YOU:   Understand these instructions.  Will watch your condition.  Will get help right away if you are not doing well or get worse. Document Released: 11/29/2010 Document Revised: 08/25/2011 Document Reviewed: 11/29/2010 [facility] Patient Information 2014 [place]. Viral Infections A virus is a  type of germ. Viruses can cause:  Minor sore throats.  Aches and pains.  Headaches.  Runny nose.  Rashes.  Watery eyes.  Tiredness.  Coughs.  Loss of appetite.  Feeling sick to your stomach (nausea).  Throwing up (vomiting).  Watery poop (diarrhea). HOME CARE   Only take medicines as told by your doctor.  Drink enough water and fluids to keep your pee (urine) clear or pale yellow. Sports drinks are a good choice.  Get plenty of rest and eat healthy. Soups and broths with crackers or rice are fine. GET HELP RIGHT AWAY IF:   You have a very bad headache.  You have shortness of breath.  You have chest pain or neck pain.  You have an unusual rash.  You cannot stop throwing up.  You have watery poop that does not stop.  You cannot keep fluids down.  You or your child has a temperature by mouth above 102 F (38.9 C), not controlled by medicine.  Your baby is older than 3 months with a rectal temperature of 102 F (38.9 C) or higher.  Your baby is 12 months old or younger with a rectal temperature of 100.4 F (38 C) or higher. MAKE SURE YOU:   Understand these instructions.  Will watch this condition.  Will get help right away if you are not doing well or get worse. Document Released: 05/15/2008 Document Revised: 08/25/2011 Document Reviewed: 10/08/2010 [facility] Patient Information 2014 [place], [place].

## 2013-06-30 NOTE — ED Provider Notes (Signed)
CSN: [id number]     Arrival date & time 06/30/13  1646 History   First MD Initiated Contact with Patient 06/30/13 1706     Chief Complaint  Patient presents with  . Chest Pain  . Hematemesis   (Consider location/radiation/quality/duration/timing/severity/associated sxs/prior Treatment) HPI Comments: Patient presents to the ED with chief complaint of cough. She also states that she has had hematemesis and hemoptysis for the past couple of days. Additionally, she reports chest pain and shortness of breath. She states that she was recently diagnosed with the flu, and thinks it might be coming back. She has not tried anything to alleviate her symptoms. She states that she is afraid to go to sleep at night. She denies any recent surgery, recent travel, history of PE/DVT, or oral contraceptive use.  The history is provided by the patient. No language interpreter was used.    Past Medical History  Diagnosis Date  . Depression   . Bipolar 1 disorder   . Headache(784.0)   . STD (female)   . Hypertension   . Schizophrenia    Past Surgical History  Procedure Laterality Date  . Bunionectomy      both feet   Family History  Problem Relation Age of Onset  . Diabetes Mother   . Hypertension Mother    History  Substance Use Topics  . Smoking status: Never Smoker   . Smokeless tobacco: Not on file  . Alcohol Use: No   OB History   Grav Para Term Preterm Abortions TAB SAB Ect Mult Living                 Review of Systems  All other systems reviewed and are negative.    Allergies  Other  Home Medications   Current Outpatient Rx  Name  Route  Sig  Dispense  Refill  . Acetaminophen-Guaifenesin (THERAFLU FLU/CHEST CONGESTION PO)   Oral   Take 30 mLs by mouth every 12 (twelve) hours.         . benztropine (COGENTIN) 1 MG tablet   Oral   Take 1 tablet (1 mg total) by mouth daily. To prevent side effects.   30 tablet   0   . fluticasone (FLONASE) 50 MCG/ACT nasal spray  Each Nare   Place 2 sprays into both nostrils daily.   16 g   0   . guaiFENesin (MUCINEX) 600 MG 12 hr tablet   Oral   Take 2 tablets (1,200 mg total) by mouth 2 (two) times daily.   40 tablet   0   . haloperidol (HALDOL) 5 MG tablet   Oral   Take 1 tablet (5 mg total) by mouth 2 (two) times daily.   60 tablet   0   . ibuprofen (ADVIL,MOTRIN) 200 MG tablet   Oral   Take 400 mg by mouth every 6 (six) hours as needed for mild pain.         Marland Kitchen lithium carbonate 300 MG capsule      Take one capsule each morning (300mg ) with breakfast, and 2 capsules (600mg ) with dinner each evening.   90 capsule   0   . ondansetron (ZOFRAN-ODT) 8 MG disintegrating tablet   Oral   Take 8 mg by mouth every 4 (four) hours as needed for nausea.          BP 130/96  Pulse 92  Temp(Src) 98.4 F (36.9 C) (Oral)  Resp 20  SpO2 99%  LMP 06/06/2013 Physical Exam  Nursing note and vitals reviewed. Constitutional: She is oriented to person, place, and time. She appears well-developed and well-nourished.  Well appearing  HENT:  Head: Normocephalic and atraumatic.  Eyes: Conjunctivae and EOM are normal. Pupils are equal, round, and reactive to light.  Neck: Normal range of motion. Neck supple.  Cardiovascular: Normal rate and regular rhythm.  Exam reveals no gallop and no friction rub.   No murmur heard. Pulmonary/Chest: Effort normal and breath sounds normal. No respiratory distress. She has no wheezes. She has no rales. She exhibits no tenderness.  CTAB  Abdominal: Soft. She exhibits no distension and no mass. There is no tenderness. There is no rebound and no guarding.  Musculoskeletal: Normal range of motion. She exhibits no edema and no tenderness.  Neurological: She is alert and oriented to person, place, and time.  Skin: Skin is warm and dry.  Psychiatric: She has a normal mood and affect. Her behavior is normal. Judgment and thought content normal.    ED Course  Procedures  (including critical care time) Results for orders placed during the hospital encounter of 06/30/13  CBC WITH DIFFERENTIAL      Result Value Range   WBC 7.5  4.0 - 10.5 K/uL   RBC 4.40  3.87 - 5.11 MIL/uL   Hemoglobin 11.6 (*) 12.0 - 15.0 g/dL   HCT 35.8 (*) 36.0 - 46.0 %   MCV 81.4  78.0 - 100.0 fL   MCH 26.4  26.0 - 34.0 pg   MCHC 32.4  30.0 - 36.0 g/dL   RDW 12.5  11.5 - 15.5 %   Platelets 238  150 - 400 K/uL   Neutrophils Relative % 57  43 - 77 %   Neutro Abs 4.3  1.7 - 7.7 K/uL   Lymphocytes Relative 35  12 - 46 %   Lymphs Abs 2.6  0.7 - 4.0 K/uL   Monocytes Relative 7  3 - 12 %   Monocytes Absolute 0.5  0.1 - 1.0 K/uL   Eosinophils Relative 1  0 - 5 %   Eosinophils Absolute 0.1  0.0 - 0.7 K/uL   Basophils Relative 0  0 - 1 %   Basophils Absolute 0.0  0.0 - 0.1 K/uL  BASIC METABOLIC PANEL      Result Value Range   Sodium 138  137 - 147 mEq/L   Potassium 4.0  3.7 - 5.3 mEq/L   Chloride 102  96 - 112 mEq/L   CO2 25  19 - 32 mEq/L   Glucose, Bld 96  70 - 99 mg/dL   BUN 6  6 - 23 mg/dL   Creatinine, Ser 0.97  0.50 - 1.10 mg/dL   Calcium 8.9  8.4 - 10.5 mg/dL   GFR calc non Af Amer 82 (*) >90 mL/min   GFR calc Af Amer >90  >90 mL/min  D-DIMER, QUANTITATIVE      Result Value Range   D-Dimer, Quant <0.27  0.00 - 0.48 ug/mL-FEU   Dg Chest 2 View  06/30/2013   CLINICAL DATA:  Shortness of breath and cough.  EXAM: CHEST  2 VIEW  COMPARISON:  09/24/2012  FINDINGS: The cardiomediastinal silhouette is within normal limits. The lungs are well inflated and clear. There is no evidence of pleural effusion or pneumothorax. No acute osseous abnormality is identified.  IMPRESSION: Unremarkable appearance of the chest.   Electronically Signed   By: Logan Bores   On: 06/30/2013 17:47      EKG Interpretation  Date/Time:  Thursday June 30 2013 16:55:55 EST Ventricular Rate:  86 PR Interval:  122 QRS Duration: 84 QT Interval:  374 QTC Calculation: 447 R Axis:   31 Text  Interpretation:  Sinus rhythm Borderline repolarization abnormality Baseline wander in lead(s) V3 V4 V5 V6 Confirmed by ALLEN  MD, ANTHONY ([id number]) on 06/30/2013 6:18:35 PM            MDM   1. Cough   2. Viral syndrome     Patient with chest pain, shortness of breath, hemoptysis, hematemesis. Will check labs.  PERC negative.  However, as the patient has stated that she has had some hemoptosis, will check d-dimer.  Labs are reassuring.  Follow up with PCP.  Patient seen by and discussed with Dr. Zenia Resides, who agrees that the patient can be discharged to home with anti-inflammatories.  Will also recommend delsym for cough.  Patient is stable and ready for discharge.    Montine Circle, PA-C 06/30/13 1906

## 2013-07-04 NOTE — ED Provider Notes (Signed)
Medical screening examination/treatment/procedure(s) were conducted as a shared visit with non-physician practitioner(s) and myself.  I personally evaluated the patient during the encounter.  EKG Interpretation    Date/Time:  Thursday June 30 2013 16:55:55 EST Ventricular Rate:  86 PR Interval:  122 QRS Duration: 84 QT Interval:  374 QTC Calculation: 447 R Axis:   31 Text Interpretation:  Sinus rhythm Borderline repolarization abnormality Baseline wander in lead(s) V3 V4 V5 V6 Confirmed by Zenia Resides  MD, Anneta Rounds ([id number]) on 06/30/2013 6:18:35 PM             Leota Jacobsen, MD 07/04/13 [phone]

## 2013-11-13 IMAGING — CT CT ABD-PELV W/ CM
1 of 2 series · 15 of 32 positions shown, 19 images · IV contrast (OMNIPAQUE 300)
Comparison: None.

CLINICAL DATA: Left abdominal pain with nausea and vomiting for 1
day.

CT ABDOMEN AND PELVIS WITH CONTRAST
TECHNIQUE: Multidetector CT imaging of the abdomen and pelvis was
performed following the standard protocol during bolus
administration of intravenous contrast.
Contrast: 100mL OMNIPAQUE IOHEXOL 300 MG/ML  SOLN, 50mL OMNIPAQUE
IOHEXOL 300 MG/ML  SOLN

[Series 2: abd/pel with · axial · 0.74mm/px · z∈[+1176,+1582]mm · 15 of 92 slices shown, 19 images]
[im 7/92  soft-tissue]
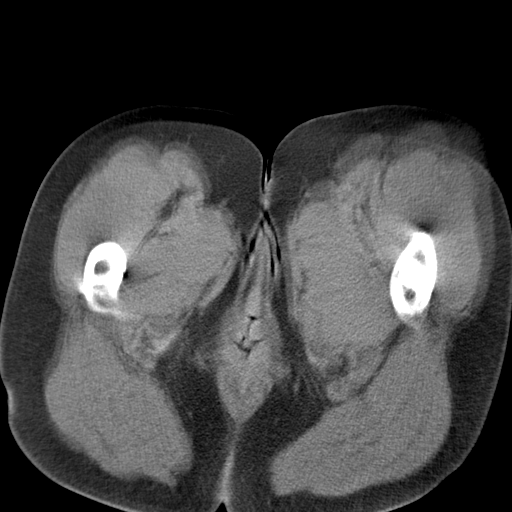
[im 7/92  bone]
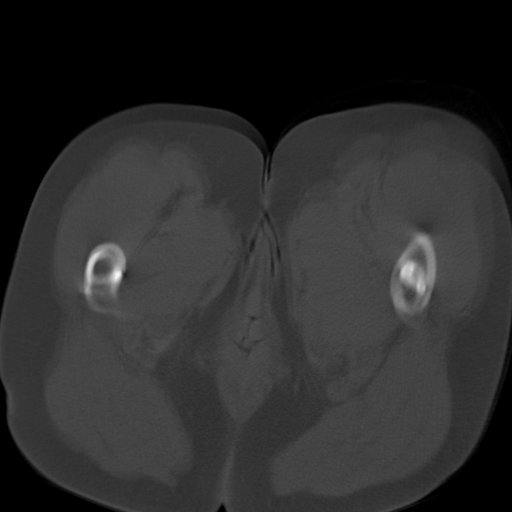
[im 14/92  soft-tissue]
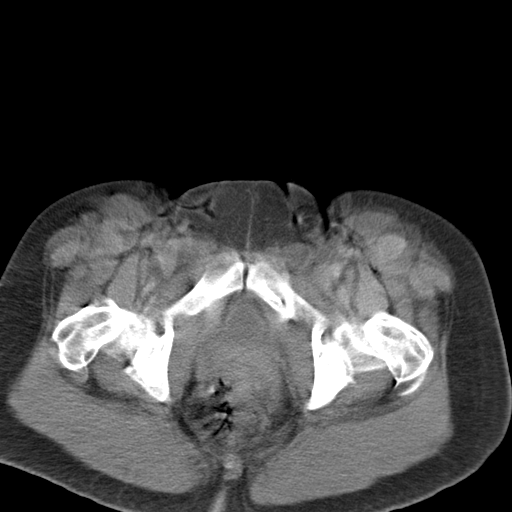
[im 21/92  soft-tissue]
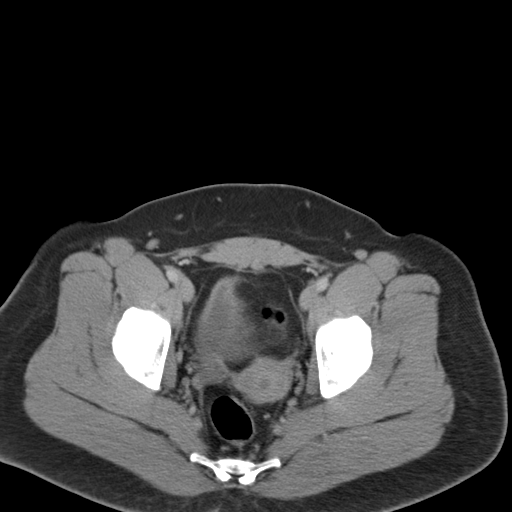
[im 27/92  soft-tissue]
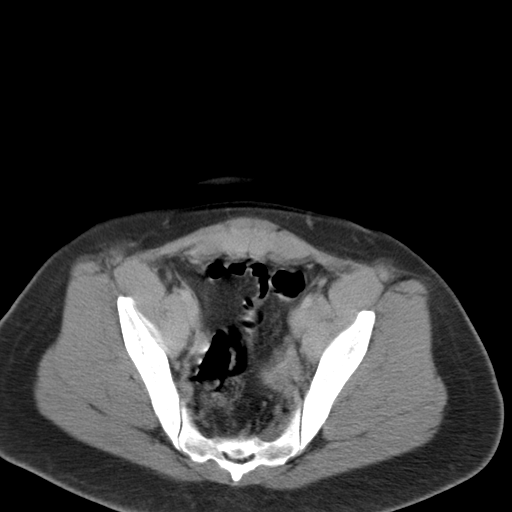
[im 34/92  soft-tissue]
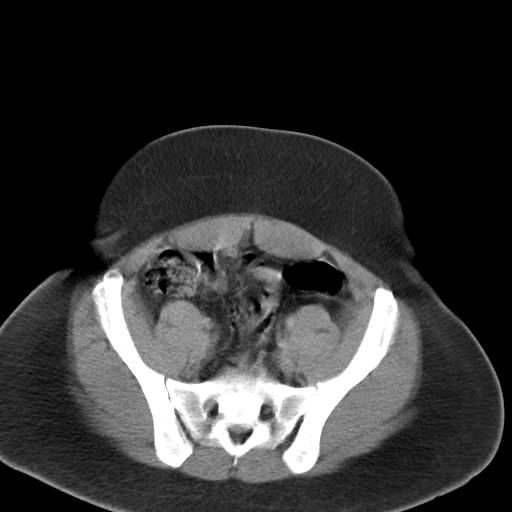
[im 41/92  soft-tissue]
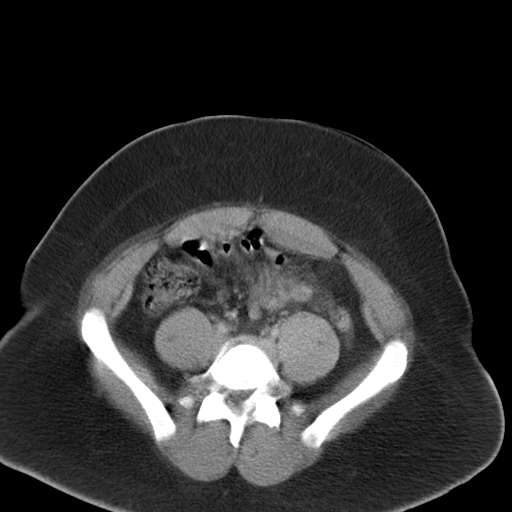
[im 48/92  soft-tissue]
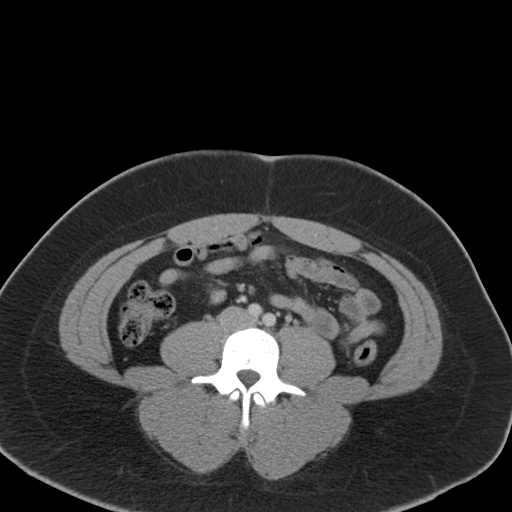
[im 54/92  soft-tissue]
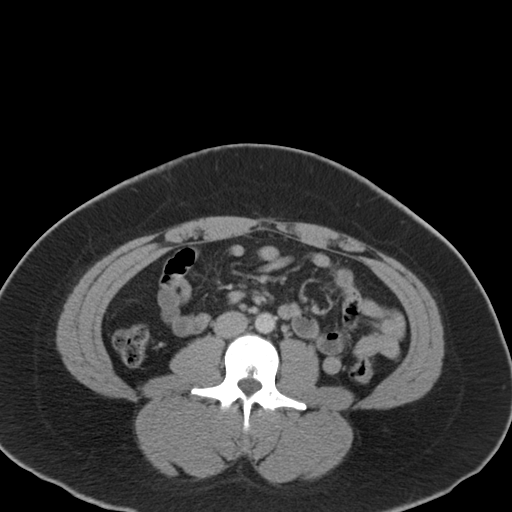
[im 61/92  soft-tissue]
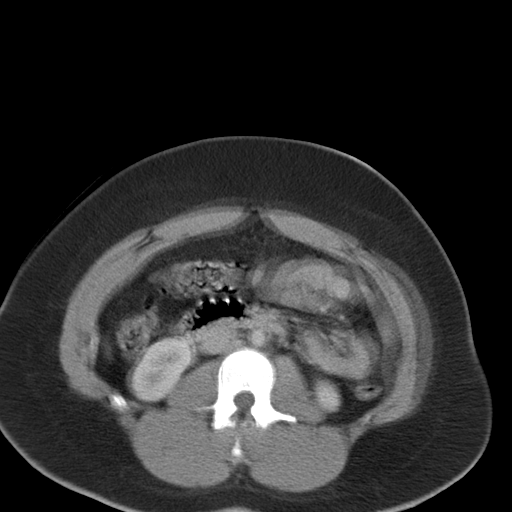
[im 61/92  bone]
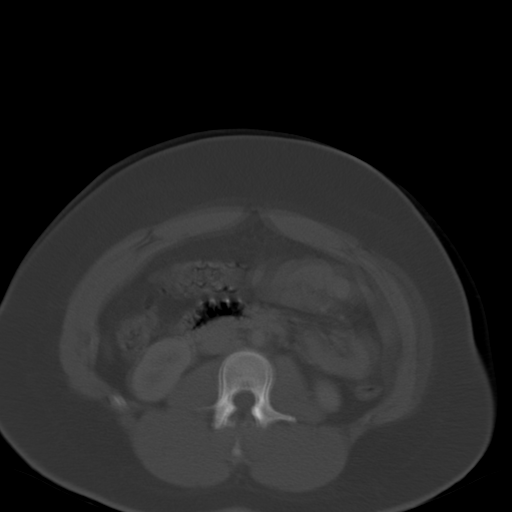
[im 68/92  soft-tissue]
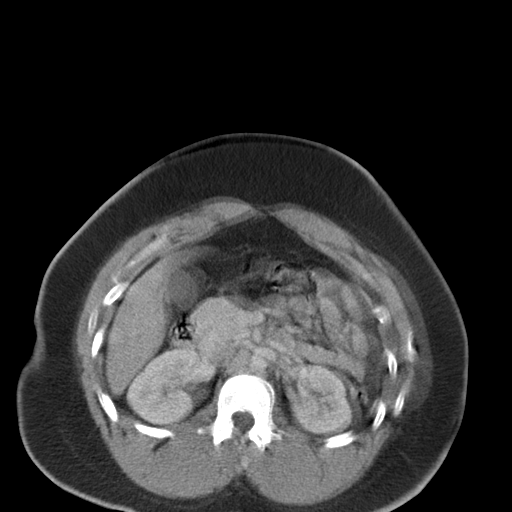
[im 75/92  soft-tissue]
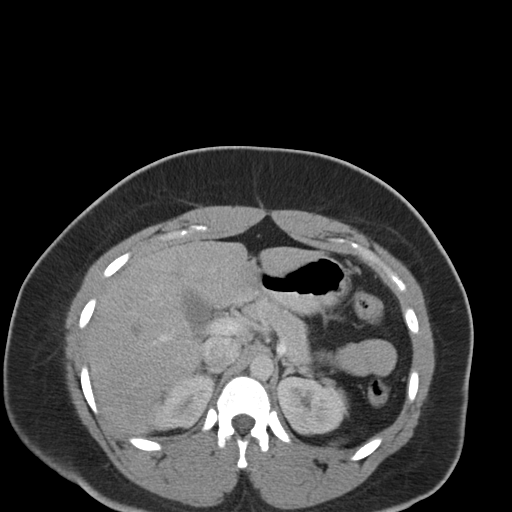
[im 78/92  lung]
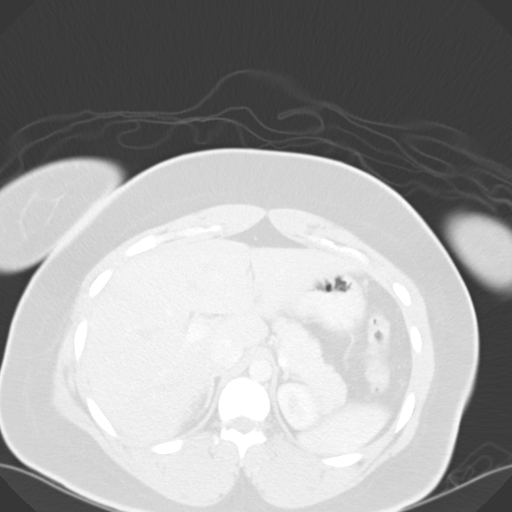
[im 81/92  soft-tissue]
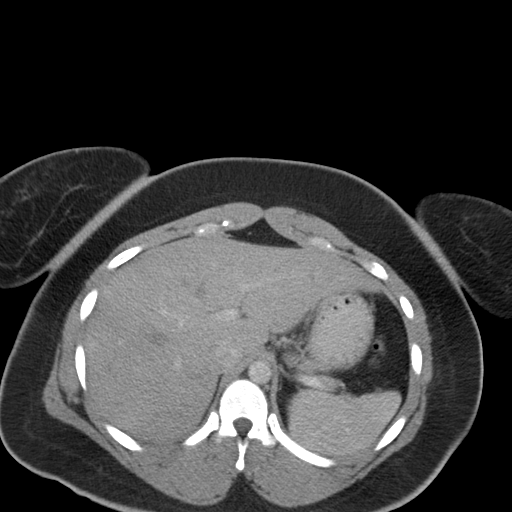
[im 81/92  lung]
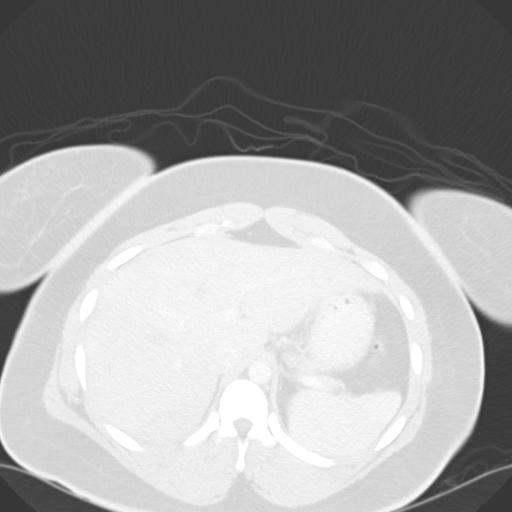
[im 85/92  lung]
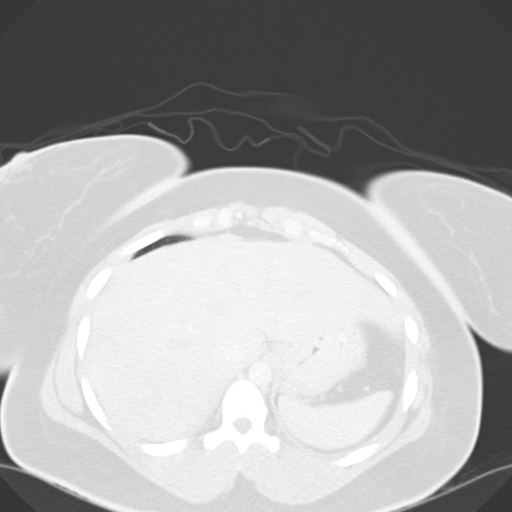
[im 88/92  soft-tissue]
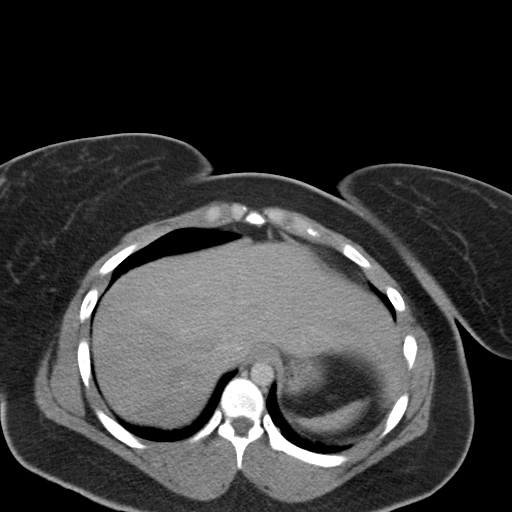
[im 88/92  lung]
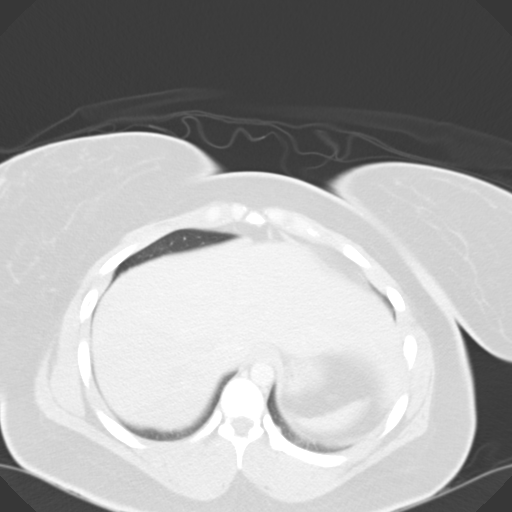

[15 of 32 positions shown; findings below may reference images not displayed]

FINDINGS: There is a possible 3 mm right lower lobe nodule on image
#1 of series 6.  The lung bases are otherwise clear.  There is no
pleural or pericardial effusion.

Images through the mid abdomen and false pelvis are mildly motion
degraded (the patient vomited during the scan).  The liver,
gallbladder, biliary system and pancreas appear normal.  The
spleen, adrenal glands and kidneys appear normal.

There is minimal enteric contrast.  There is no bowel distension or
wall thickening.  The appendix is not well seen in part due to the
motion.  No pericecal inflammatory changes are identified.

The uterus, ovaries and bladder appear normal.  There is a
prominent left perirectal lymph node measuring 8 mm on image 69.
No surrounding inflammatory changes are seen.  Small inguinal lymph
nodes bilaterally are not pathologically enlarged.
IMPRESSION: 1.  No acute abdominal pelvic findings identified.
2.  Examination is mildly motion degraded.
3.  Prominent left perirectal lymph node of undetermined etiology.
4.  Possible 3 mm right lower lobe pulmonary nodule, likely benign
in a patient of this age.

## 2014-01-15 ENCOUNTER — Emergency Department (HOSPITAL_COMMUNITY)
Admission: EM | Admit: 2014-01-15 | Discharge: 2014-01-16 | Disposition: A | Discharge status: Home / Self Care | Payer: No Typology Code available for payment source | Attending: Emergency Medicine | Admitting: Emergency Medicine

## 2014-01-15 ENCOUNTER — Encounter (HOSPITAL_COMMUNITY): Payer: Self-pay | Admitting: Emergency Medicine

## 2014-01-15 DIAGNOSIS — F3289 Other specified depressive episodes: Secondary | ICD-10-CM | POA: Insufficient documentation

## 2014-01-15 DIAGNOSIS — R109 Unspecified abdominal pain: Secondary | ICD-10-CM | POA: Insufficient documentation

## 2014-01-15 DIAGNOSIS — Z3202 Encounter for pregnancy test, result negative: Secondary | ICD-10-CM | POA: Insufficient documentation

## 2014-01-15 DIAGNOSIS — F32A Depression, unspecified: Secondary | ICD-10-CM

## 2014-01-15 DIAGNOSIS — R1030 Lower abdominal pain, unspecified: Secondary | ICD-10-CM

## 2014-01-15 DIAGNOSIS — F329 Major depressive disorder, single episode, unspecified: Secondary | ICD-10-CM

## 2014-01-15 DIAGNOSIS — F419 Anxiety disorder, unspecified: Secondary | ICD-10-CM

## 2014-01-15 DIAGNOSIS — F411 Generalized anxiety disorder: Secondary | ICD-10-CM | POA: Insufficient documentation

## 2014-01-15 DIAGNOSIS — I10 Essential (primary) hypertension: Secondary | ICD-10-CM | POA: Insufficient documentation

## 2014-01-15 DIAGNOSIS — Z8659 Personal history of other mental and behavioral disorders: Secondary | ICD-10-CM | POA: Insufficient documentation

## 2014-01-15 DIAGNOSIS — Z8619 Personal history of other infectious and parasitic diseases: Secondary | ICD-10-CM | POA: Insufficient documentation

## 2014-01-15 DIAGNOSIS — M545 Low back pain, unspecified: Secondary | ICD-10-CM | POA: Insufficient documentation

## 2014-01-15 LAB — BASIC METABOLIC PANEL
Anion gap: 12 (ref 5–15)
BUN: 5 mg/dL — ABNORMAL LOW (ref 6–23)
CO2: 27 mEq/L (ref 19–32)
Calcium: 9.9 mg/dL (ref 8.4–10.5)
Chloride: 99 mEq/L (ref 96–112)
Creatinine, Ser: 0.99 mg/dL (ref 0.50–1.10)
GFR calc Af Amer: >90 mL/min (ref >90)
GFR calc non Af Amer: 80 mL/min — ABNORMAL LOW (ref >90)
Glucose, Bld: 92 mg/dL (ref 70–99)
POTASSIUM: 4 meq/L (ref 3.7–5.3)
SODIUM: 138 meq/L (ref 137–147)

## 2014-01-15 LAB — CBC
HCT: 40.3 % (ref 36.0–46.0)
HEMOGLOBIN: 12.9 g/dL (ref 12.0–15.0)
MCH: 25.2 pg — ABNORMAL LOW (ref 26.0–34.0)
MCHC: 32 g/dL (ref 30.0–36.0)
MCV: 78.7 fL (ref 78.0–100.0)
PLATELETS: 242 10*3/uL (ref 150–400)
RBC: 5.12 MIL/uL — AB (ref 3.87–5.11)
RDW: 13 % (ref 11.5–15.5)
WBC: 10.1 10*3/uL (ref 4.0–10.5)

## 2014-01-15 LAB — URINALYSIS, ROUTINE W REFLEX MICROSCOPIC
Bilirubin Urine: NEGATIVE
Glucose, UA: NEGATIVE mg/dL
HGB URINE DIPSTICK: NEGATIVE
KETONES UR: NEGATIVE mg/dL
Leukocytes, UA: NEGATIVE
Nitrite: NEGATIVE
PROTEIN: NEGATIVE mg/dL
Specific Gravity, Urine: 1.025 (ref 1.005–1.030)
UROBILINOGEN UA: 0.2 mg/dL (ref 0.0–1.0)
pH: 6 (ref 5.0–8.0)

## 2014-01-15 LAB — PREGNANCY, URINE: Preg Test, Ur: NEGATIVE

## 2014-01-15 NOTE — ED Notes (Addendum)
Pt reports miscarriage at 38 weeks, 4 weeks ago and still having occasional small amount of bright red bleeding. Pt also reports in the mornings having chest pressure like someone is sitting on her chest with sob. Lower back pain as well.

## 2014-01-16 ENCOUNTER — Encounter (HOSPITAL_COMMUNITY): Payer: Self-pay | Admitting: Emergency Medicine

## 2014-01-16 LAB — I-STAT TROPONIN, ED: TROPONIN I, POC: 0 ng/mL (ref 0.00–0.08)

## 2014-01-16 MED ORDER — NAPROXEN 500 MG PO TABS: 500.0000 mg | ORAL_TABLET | Freq: Two times a day (BID) | ORAL | Status: DC

## 2014-01-16 NOTE — ED Notes (Signed)
Meal at Bedside

## 2014-01-16 NOTE — Progress Notes (Signed)
Writer informed the nurse Theadora Rama that the patient will be assessed by Dr. Louretta Shorten.

## 2014-01-16 NOTE — ED Notes (Signed)
Pt became increasingly anxious and nervous when grandmother was at the bedside. [place] mother trying to calm pt. Pt reported that if she went home she would kill her self.

## 2014-01-16 NOTE — ED Notes (Signed)
MD at bedside. Dr. Lenna Sciara"

## 2014-01-16 NOTE — ED Provider Notes (Signed)
Pt alert, content, nad.  Vitals normal. Awaiting psych team evaluation and disposition.    Mirna Mires, MD 01/16/14 1224

## 2014-01-16 NOTE — BH Assessment (Signed)
Assessment could not be completed at this time due to connectivity issues from [facility] ED.

## 2014-01-16 NOTE — Discharge Instructions (Signed)
Abdominal Pain Many things can cause abdominal pain. Usually, abdominal pain is not caused by a disease and will improve without treatment. It can often be observed and treated at home. Your health care provider will do a physical exam and possibly order blood tests and X-rays to help determine the seriousness of your pain. However, in many cases, more time must pass before a clear cause of the pain can be found. Before that point, your health care provider may not know if you need more testing or further treatment. HOME CARE INSTRUCTIONS  Monitor your abdominal pain for any changes. The following actions may help to alleviate any discomfort you are experiencing:  Only take over-the-counter or prescription medicines as directed by your health care provider.  Do not take laxatives unless directed to do so by your health care provider.  Try a clear liquid diet (broth, tea, or water) as directed by your health care provider. Slowly move to a bland diet as tolerated. SEEK MEDICAL CARE IF:  You have unexplained abdominal pain.  You have abdominal pain associated with nausea or diarrhea.  You have pain when you urinate or have a bowel movement.  You experience abdominal pain that wakes you in the night.  You have abdominal pain that is worsened or improved by eating food.  You have abdominal pain that is worsened with eating fatty foods.  You have a fever. SEEK IMMEDIATE MEDICAL CARE IF:   Your pain does not go away within 2 hours.  You keep throwing up (vomiting).  Your pain is felt only in portions of the abdomen, such as the right side or the left lower portion of the abdomen.  You pass bloody or black tarry stools. MAKE SURE YOU:  Understand these instructions.   Will watch your condition.   Will get help right away if you are not doing well or get worse.  Document Released: 03/12/2005 Document Revised: 06/07/2013 Document Reviewed: 02/09/2013 [facility] Patient Information  2015 [place], [place]. This information is not intended to replace advice given to you by your health care provider. Make sure you discuss any questions you have with your health care provider.  Depression Depression refers to feeling sad, low, down in the dumps, blue, gloomy, or empty. In general, there are two kinds of depression: 1. Normal sadness or normal grief. This kind of depression is one that we all feel from time to time after upsetting life experiences, such as the loss of a job or the ending of a relationship. This kind of depression is considered normal, is short lived, and resolves within a few days to 2 weeks. Depression experienced after the loss of a loved one (bereavement) often lasts longer than 2 weeks but normally gets better with time. 2. Clinical depression. This kind of depression lasts longer than normal sadness or normal grief or interferes with your ability to function at home, at work, and in school. It also interferes with your personal relationships. It affects almost every aspect of your life. Clinical depression is an illness. Symptoms of depression can also be caused by conditions other than those mentioned above, such as:  Physical illness. Some physical illnesses, including underactive thyroid gland (hypothyroidism), severe anemia, specific types of cancer, diabetes, uncontrolled seizures, heart and lung problems, strokes, and chronic pain are commonly associated with symptoms of depression.  Side effects of some prescription medicine. In some people, certain types of medicine can cause symptoms of depression.  Substance abuse. Abuse of alcohol and illicit drugs  can cause symptoms of depression. SYMPTOMS Symptoms of normal sadness and normal grief include the following:  Feeling sad or crying for short periods of time.  Not caring about anything (apathy).  Difficulty sleeping or sleeping too much.  No longer able to enjoy the things you used to enjoy.  Desire  to be by oneself all the time (social isolation).  Lack of energy or motivation.  Difficulty concentrating or remembering.  Change in appetite or weight.  Restlessness or agitation. Symptoms of clinical depression include the same symptoms of normal sadness or normal grief and also the following symptoms:  Feeling sad or crying all the time.  Feelings of guilt or worthlessness.  Feelings of hopelessness or helplessness.  Thoughts of suicide or the desire to harm yourself (suicidal ideation).  Loss of touch with reality (psychotic symptoms). Seeing or hearing things that are not real (hallucinations) or having false beliefs about your life or the people around you (delusions and paranoia). DIAGNOSIS  The diagnosis of clinical depression is usually based on how bad the symptoms are and how long they have lasted. Your health care provider will also ask you questions about your medical history and substance use to find out if physical illness, use of prescription medicine, or substance abuse is causing your depression. Your health care provider may also order blood tests. TREATMENT  Often, normal sadness and normal grief do not require treatment. However, sometimes antidepressant medicine is given for bereavement to ease the depressive symptoms until they resolve. The treatment for clinical depression depends on how bad the symptoms are but often includes antidepressant medicine, counseling with a mental health professional, or both. Your health care provider will help to determine what treatment is best for you. Depression caused by physical illness usually goes away with appropriate medical treatment of the illness. If prescription medicine is causing depression, talk with your health care provider about stopping the medicine, decreasing the dose, or changing to another medicine. Depression caused by the abuse of alcohol or illicit drugs goes away when you stop using these substances. Some  adults need professional help in order to stop drinking or using drugs. SEEK IMMEDIATE MEDICAL CARE IF:  You have thoughts about hurting yourself or others.  You lose touch with reality (have psychotic symptoms).  You are taking medicine for depression and have a serious side effect. FOR MORE INFORMATION  National [employer] on Mental Illness: www.nami.[employer]: https://carter.com/ Document Released: 05/30/2000 Document Revised: 10/17/2013 Document Reviewed: 09/01/2011 [facility] Patient Information 2015 [place], [place]. This information is not intended to replace advice given to you by your health care provider. Make sure you discuss any questions you have with your health care provider.

## 2014-01-16 NOTE — ED Notes (Signed)
TTS set up in room.  
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
## 2014-01-16 NOTE — BHH Counselor (Addendum)
Writer had four more attempts to call pt's grandmother. Writer left voicemail at pt's home number  [phone].  Arnold Long, [place] Assessment Counselor 11:45 am     Writer completed TA. Writer attempting to collect collateral info from pt's grandmother Dema Severin. Writer has called grandmother multiple times at [phone] but there is no voicemail. Pt told Probation officer that grandmother's # is [phone], so Probation officer also left voicemail for that phone number. Writer called Research scientist (medical) who will notify grandmother to Microbiologist if gmother gets in contact w/ pt.  Arnold Long, [place] Assessment Counselor 9:40 am
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
## 2014-01-16 NOTE — ED Notes (Signed)
New meal tray ordered for pts.request

## 2014-01-16 NOTE — Consult Note (Signed)
[facility] Face-to-Face Psychiatry Consult   Reason for Consult:  Depression and suicidal Referring Physician:  EDP Kathleen [name] is an 23 y.o. female. Total Time spent with patient: 45 minutes  Assessment: AXIS I:  Major Depression, Recurrent severe and Post Traumatic Stress Disorder AXIS II:  Deferred AXIS III:   Past Medical History  Diagnosis Date  . Depression   . Bipolar 1 disorder   . Headache(784.0)   . STD (female)   . Hypertension   . Schizophrenia    AXIS IV:  economic problems, other psychosocial or environmental problems, problems related to social environment and problems with primary support group AXIS V:  51-60 moderate symptoms  Plan:  Case discussed with therapist from [facility], staff RN and ER Case manager No evidence of imminent risk to self or others at present.   Patient does not meet criteria for psychiatric inpatient admission. Supportive therapy provided about ongoing stressors. Discussed crisis plan, support from social network, calling 911, coming to the Emergency Department, and calling Suicide Hotline. Referred to out patient psychiatric services including [facility] heatlth care.  No medication recommendations Appreciate psychiatric consultation Please contact [phone] if needs further assistance  Subjective:   Kathleen [name] is a 23 y.o. female patient admitted with depression and suicidal as per patient grandma.  HPI:  Patient is seen for face to face psychiatric evaluation for depression and suicidal ideations. Patient reported that she came to [facility] in a city bus because she is not feeling well and has chronic chest discomfort. Patient sister and grandma who visited her told staff that she is suicidal and delusional. Patient denied being delusional, and suicidal or homicial ideations. She has history of [facility] admission over a year ago but non compliant with her medication management but willing to follow up out patient care. Patient endorses  having a miscarriage about three weeks ago and broke up with EX BF about four months ago because he is cheating on her. She is living with her father, and his wife and young nephew ([id number]).  As per [facility] assessment, Kathleen [name] is an 23 y.o. female. Pt presents voluntarily to [facility]. Per chart review, pt stated she had a miscarriage after 34 week pregnancy. Chart notes also report pt became suicidal when her grandmother visited pt. Pt is cooperative during assessment, yet she is guarded with some answers. Pt is a somewhat poor historian d/t refusal to elaborate in her answers. Pt denies SI and HI. She denies [facility]. Pt sts she hasn't experienced AH or VH "in a while". Pt reports she feels she was "set up" by her grandmother. She endorses "lonely, depressed" mood". Pt's affect is anxious. She sts, "I am nice" and that her niceness makes other people take advantage of her. She reports she has cut herself "in a while". She sts she has a boyfriend in prison who calls her daily to make sure she doesn't cut herself. Pt refuses to sts who these "other people" are. Pt sts she feels safe at her home she shares w/ her father, father's girlfriend and her 48 yo nephew. When writer explained that pt's grandmother would be contacted for collateral info, pt became agitated and stated that grandmother would lie to Probation officer. Pt says, "She is setting me up. She told me I would only have to be here [facility]) for one day, and I'm ready to leave." Per chart review, pt was admitted to [place] in May 2014 and in June 2014. She reports no  inpt admissions since June 2014. Pt's UDS was negative and no alcohol on board. Pt refuses to answer re: possible miscarriage and/or pregnancy. Pt sts she isn't currently seeing a psychiatrist and that she has run out of her psych meds. Pt reports labile mood. Writer has attempted to call pt's grandmother Kathleen [name] several times for collateral info.   HPI Elements:   Location:  depression. Quality:   fair. Severity:  moderate. Timing:  unknown.  Past Psychiatric History: Past Medical History  Diagnosis Date  . Depression   . Bipolar 1 disorder   . Headache(784.0)   . STD (female)   . Hypertension   . Schizophrenia     reports that she has never smoked. She does not have any smokeless tobacco history on file. She reports that she does not drink alcohol or use illicit drugs. Family History  Problem Relation Age of Onset  . Diabetes Mother   . Hypertension Mother    Family History Substance Abuse: No Family Supports: Yes, List: (dad, grandmother) Living Arrangements: Parent;Other relatives (dad, dad's girlfriend, 46 yo nephew) Can pt return to current living arrangement?: Yes Abuse/Neglect [facility]) Physical Abuse: Denies Verbal Abuse: Denies Sexual Abuse: Denies Allergies:   Allergies  Allergen Reactions  . Other     Unknown antibiotic that caused itching and rash    ACT Assessment Complete:  Yes:    Educational Status    Risk to Self: Risk to self with the past 6 months Suicidal Ideation: No Suicidal Intent: No Is patient at risk for suicide?: No Suicidal Plan?: No Access to Means: No What has been your use of drugs/alcohol within the last 12 months?: none Previous Attempts/Gestures: Yes How many times?:  (several prior attempts - by cutting herself) Other Self Harm Risks: none Triggers for Past Attempts: Hallucinations Intentional Self Injurious Behavior: Cutting Comment - Self Injurious Behavior: pt sts she hasn't cut - "it has been a while" Family Suicide History: No Recent stressful life event(s):  (people "using" pt b/c pt is nice) Persecutory voices/beliefs?: Yes Depression: Yes Depression Symptoms: Tearfulness;Despondent Substance abuse history and/or treatment for substance abuse?: No Suicide prevention information given to non-admitted patients: Not applicable  Risk to Others: Risk to Others within the past 6 months Homicidal Ideation: No Thoughts of  Harm to Others: No Current Homicidal Intent: No Current Homicidal Plan: No Access to Homicidal Means: No Identified Victim: none History of harm to others?: No Assessment of Violence: None Noted Violent Behavior Description: pt denies hx violence - is calm Does patient have access to weapons?: No Criminal Charges Pending?: No Does patient have a court date: No  Abuse: Abuse/Neglect Assessment (Assessment to be complete while patient is alone) Physical Abuse: Denies Verbal Abuse: Denies Sexual Abuse: Denies Exploitation of patient/patient's resources: Denies Self-Neglect: Denies  Prior Inpatient Therapy: Prior Inpatient Therapy Prior Inpatient Therapy: Yes Prior Therapy Dates: 2014 and years prior Prior Therapy Facilty/Provider(s): [facility] (May & June 2014) Reason for Treatment: suicide attempts, psychosis  Prior Outpatient Therapy: Prior Outpatient Therapy Prior Outpatient Therapy: Yes Prior Therapy Dates: in the past Prior Therapy Facilty/Provider(s): unknown  Additional Information: Additional Information 1:1 In Past 12 Months?: No CIRT Risk: No Elopement Risk: No Does patient have medical clearance?: Yes    Objective: Blood pressure 100/67, pulse 78, temperature 97.9 F (36.6 C), temperature source Oral, resp. rate 18, height _0  (1.753 m), weight 102.967 kg (227 lb), last menstrual period 04/30/2013, SpO2 99.00%.Body mass index is 33.51 kg/(m^2). Results for orders  placed during the hospital encounter of 01/15/14 (from the past 72 hour(s))  CBC     Status: Abnormal   Collection Time    01/15/14  8:10 PM      Result Value Ref Range   WBC 10.1  4.0 - 10.5 K/uL   RBC 5.12 (*) 3.87 - 5.11 MIL/uL   Hemoglobin 12.9  12.0 - 15.0 g/dL   HCT 40.3  36.0 - 46.0 %   MCV 78.7  78.0 - 100.0 fL   MCH 25.2 (*) 26.0 - 34.0 pg   MCHC 32.0  30.0 - 36.0 g/dL   RDW 13.0  11.5 - 15.5 %   Platelets 242  150 - 400 K/uL  BASIC METABOLIC PANEL     Status: Abnormal   Collection Time     01/15/14  8:10 PM      Result Value Ref Range   Sodium 138  137 - 147 mEq/L   Potassium 4.0  3.7 - 5.3 mEq/L   Chloride 99  96 - 112 mEq/L   CO2 27  19 - 32 mEq/L   Glucose, Bld 92  70 - 99 mg/dL   BUN 5 (*) 6 - 23 mg/dL   Creatinine, Ser 0.99  0.50 - 1.10 mg/dL   Calcium 9.9  8.4 - 10.5 mg/dL   GFR calc non Af Amer 80 (*) >90 mL/min   GFR calc Af Amer >90  >90 mL/min   Comment: (NOTE)     The eGFR has been calculated using the CKD EPI equation.     This calculation has not been validated in all clinical situations.     eGFR's persistently <90 mL/min signify possible Chronic Kidney     Disease.   Anion gap 12  5 - 15  PREGNANCY, URINE     Status: None   Collection Time    01/15/14  8:36 PM      Result Value Ref Range   Preg Test, Ur NEGATIVE  NEGATIVE   Comment:            THE SENSITIVITY OF THIS     METHODOLOGY IS >20 mIU/mL.  URINALYSIS, ROUTINE W REFLEX MICROSCOPIC     Status: None   Collection Time    01/15/14  8:36 PM      Result Value Ref Range   Color, Urine YELLOW  YELLOW   APPearance CLEAR  CLEAR   Specific Gravity, Urine 1.025  1.005 - 1.030   pH 6.0  5.0 - 8.0   Glucose, UA NEGATIVE  NEGATIVE mg/dL   Hgb urine dipstick NEGATIVE  NEGATIVE   Bilirubin Urine NEGATIVE  NEGATIVE   Ketones, ur NEGATIVE  NEGATIVE mg/dL   Protein, ur NEGATIVE  NEGATIVE mg/dL   Urobilinogen, UA 0.2  0.0 - 1.0 mg/dL   Nitrite NEGATIVE  NEGATIVE   Leukocytes, UA NEGATIVE  NEGATIVE   Comment: MICROSCOPIC NOT DONE ON URINES WITH NEGATIVE PROTEIN, BLOOD, LEUKOCYTES, NITRITE, OR GLUCOSE <1000 mg/dL.  Kathleen [name], ED     Status: None   Collection Time    01/16/14  2:21 AM      Result Value Ref Range   Troponin i, poc 0.00  0.00 - 0.08 ng/mL   Comment 3            Comment: Due to the release kinetics of cTnI,     a negative result within the first hours     of the onset of symptoms does not rule out  myocardial infarction with certainty.     If myocardial infarction is still  suspected,     repeat the test at appropriate intervals.   Labs are reviewed and are pertinent for as above.  No current facility-administered medications for this encounter.   Current Outpatient Prescriptions  Medication Sig Dispense Refill  . naproxen (NAPROSYN) 500 MG tablet Take 1 tablet (500 mg total) by mouth 2 (two) times daily with a meal.  30 tablet  0    Psychiatric Specialty Exam: Physical Exam Full physical performed in Emergency Department. I have reviewed this assessment and concur with its findings.   Review of Systems  Psychiatric/Behavioral: Positive for depression. The patient is nervous/anxious.     Blood pressure 100/67, pulse 78, temperature 97.9 F (36.6 C), temperature source Oral, resp. rate 18, height _0  (1.753 m), weight 102.967 kg (227 lb), last menstrual period 04/30/2013, SpO2 99.00%.Body mass index is 33.51 kg/(m^2).  General Appearance: Casual  Eye Contact::  Good  Speech:  Clear and Coherent and Slow  Volume:  Decreased  Mood:  Anxious  Affect:  Appropriate and Congruent  Thought Process:  Coherent and Goal Directed  Orientation:  Full (Time, Place, and Person)  Thought Content:  WDL  Suicidal Thoughts:  No  Homicidal Thoughts:  No  Memory:  Immediate;   Fair Recent;   Fair  Judgement:  Intact  Insight:  Fair  Psychomotor Activity:  Decreased  Concentration:  Fair  Recall:  Good  Fund of Knowledge:Good  Language: Fair  Akathisia:  NA  Handed:  Right  AIMS (if indicated):     Assets:  Communication Skills Desire for Improvement Housing Leisure Time Physical Health Resilience Social Support Talents/Skills  Sleep:      Musculoskeletal: Strength & Muscle Tone: within normal limits Gait & Station: normal Patient leans: N/A  Treatment Plan Summary: Daily contact with patient to assess and evaluate symptoms and progress in treatment Medication management Refer to out patient psychiatric services upon medically  cleared.  Treasure Ingrum,JANARDHAHA R. 01/16/2014 10:28 AM

## 2014-04-25 DIAGNOSIS — Z792 Long term (current) use of antibiotics: Secondary | ICD-10-CM | POA: Insufficient documentation

## 2014-04-25 DIAGNOSIS — M25571 Pain in right ankle and joints of right foot: Secondary | ICD-10-CM | POA: Insufficient documentation

## 2014-04-25 DIAGNOSIS — N72 Inflammatory disease of cervix uteri: Secondary | ICD-10-CM | POA: Insufficient documentation

## 2014-04-25 DIAGNOSIS — M79604 Pain in right leg: Secondary | ICD-10-CM | POA: Insufficient documentation

## 2014-04-25 DIAGNOSIS — Z79899 Other long term (current) drug therapy: Secondary | ICD-10-CM | POA: Insufficient documentation

## 2014-04-25 DIAGNOSIS — Z3202 Encounter for pregnancy test, result negative: Secondary | ICD-10-CM | POA: Insufficient documentation

## 2014-04-25 DIAGNOSIS — B373 Candidiasis of vulva and vagina: Secondary | ICD-10-CM | POA: Insufficient documentation

## 2014-04-25 DIAGNOSIS — I10 Essential (primary) hypertension: Secondary | ICD-10-CM | POA: Insufficient documentation

## 2014-04-25 DIAGNOSIS — M25572 Pain in left ankle and joints of left foot: Secondary | ICD-10-CM | POA: Insufficient documentation

## 2014-04-25 DIAGNOSIS — M79605 Pain in left leg: Secondary | ICD-10-CM | POA: Insufficient documentation

## 2014-04-25 DIAGNOSIS — Z8659 Personal history of other mental and behavioral disorders: Secondary | ICD-10-CM | POA: Insufficient documentation

## 2014-04-25 DIAGNOSIS — Z791 Long term (current) use of non-steroidal anti-inflammatories (NSAID): Secondary | ICD-10-CM | POA: Insufficient documentation

## 2014-04-25 DIAGNOSIS — Z8619 Personal history of other infectious and parasitic diseases: Secondary | ICD-10-CM | POA: Insufficient documentation

## 2014-04-26 ENCOUNTER — Encounter (HOSPITAL_COMMUNITY): Payer: Self-pay | Admitting: Emergency Medicine

## 2014-04-26 ENCOUNTER — Emergency Department (HOSPITAL_COMMUNITY)
Admission: EM | Admit: 2014-04-26 | Discharge: 2014-04-26 | Disposition: A | Discharge status: Home / Self Care | Payer: No Typology Code available for payment source | Attending: Emergency Medicine | Admitting: Emergency Medicine

## 2014-04-26 DIAGNOSIS — B379 Candidiasis, unspecified: Secondary | ICD-10-CM

## 2014-04-26 DIAGNOSIS — N72 Inflammatory disease of cervix uteri: Secondary | ICD-10-CM

## 2014-04-26 DIAGNOSIS — M79605 Pain in left leg: Secondary | ICD-10-CM

## 2014-04-26 DIAGNOSIS — M79604 Pain in right leg: Secondary | ICD-10-CM

## 2014-04-26 DIAGNOSIS — M79672 Pain in left foot: Secondary | ICD-10-CM

## 2014-04-26 DIAGNOSIS — M79671 Pain in right foot: Secondary | ICD-10-CM

## 2014-04-26 LAB — URINALYSIS, ROUTINE W REFLEX MICROSCOPIC
BILIRUBIN URINE: NEGATIVE
GLUCOSE, UA: NEGATIVE mg/dL
KETONES UR: NEGATIVE mg/dL
Nitrite: NEGATIVE
Protein, ur: NEGATIVE mg/dL
Specific Gravity, Urine: 1.027 (ref 1.005–1.030)
Urobilinogen, UA: 1 mg/dL (ref 0.0–1.0)
pH: 7 (ref 5.0–8.0)

## 2014-04-26 LAB — URINE MICROSCOPIC-ADD ON

## 2014-04-26 LAB — WET PREP, GENITAL
Clue Cells Wet Prep HPF POC: NONE SEEN
Trich, Wet Prep: NONE SEEN

## 2014-04-26 LAB — HIV ANTIBODY (ROUTINE TESTING W REFLEX): HIV 1&2 Ab, 4th Generation: NONREACTIVE

## 2014-04-26 LAB — GC/CHLAMYDIA PROBE AMP
CT PROBE, AMP APTIMA: NEGATIVE
GC Probe RNA: NEGATIVE

## 2014-04-26 LAB — POC URINE PREG, ED: Preg Test, Ur: NEGATIVE

## 2014-04-26 MED ORDER — LIDOCAINE HCL (PF) 1 % IJ SOLN
INTRAMUSCULAR | Status: AC
  Filled 2014-04-26: qty 5

## 2014-04-26 MED ORDER — GABAPENTIN 300 MG PO CAPS: 300.0000 mg | ORAL_CAPSULE | Freq: Every day | ORAL | Status: DC

## 2014-04-26 MED ORDER — CEFTRIAXONE SODIUM 250 MG IJ SOLR: 250.0000 mg | INTRAMUSCULAR | Status: DC

## 2014-04-26 MED ORDER — CEFTRIAXONE SODIUM 250 MG IJ SOLR
250.0000 mg | Freq: Once | INTRAMUSCULAR | Status: AC
  Administered 2014-04-26: 250 mg via INTRAMUSCULAR
  Filled 2014-04-26: qty 250

## 2014-04-26 MED ORDER — GABAPENTIN 300 MG PO CAPS
300.0000 mg | ORAL_CAPSULE | Freq: Once | ORAL | Status: AC
  Administered 2014-04-26: 300 mg via ORAL
  Filled 2014-04-26: qty 1

## 2014-04-26 MED ORDER — AZITHROMYCIN 250 MG PO TABS
1000.0000 mg | ORAL_TABLET | Freq: Once | ORAL | Status: AC
  Administered 2014-04-26: 1000 mg via ORAL
  Filled 2014-04-26: qty 4

## 2014-04-26 MED ORDER — NAPROXEN 500 MG PO TABS: 500.0000 mg | ORAL_TABLET | Freq: Two times a day (BID) | ORAL | Status: DC

## 2014-04-26 MED ORDER — LIDOCAINE HCL (PF) 1 % IJ SOLN
1.0000 mL | Freq: Once | INTRAMUSCULAR | Status: AC
  Administered 2014-04-26: 1 mL

## 2014-04-26 MED ORDER — FLUCONAZOLE 150 MG PO TABS: 150.0000 mg | ORAL_TABLET | Freq: Once | ORAL | Status: DC

## 2014-04-26 MED ORDER — CEPHALEXIN 500 MG PO CAPS: 500.0000 mg | ORAL_CAPSULE | Freq: Two times a day (BID) | ORAL | Status: DC

## 2014-04-26 NOTE — Discharge Instructions (Signed)
Please follow up with your primary care physician in 1-2 days. If you do not have one please call the [facility] number listed above. Please follow up with an Ob/Gyn at the outpatient clinic to schedule a follow up appointment. Please take all medications as prescribed. Please refrain from sexual intercourse for at least 10 days to allow your antitbiotics to work. You will receive a call in 48 hours for a positive gonorrhea/chlamydia test. Please take your antibiotic until completion. Please read all discharge instructions and return precautions.   Sexually Transmitted Disease A sexually transmitted disease (STD) is a disease or infection that may be passed (transmitted) from person to person, usually during sexual activity. This may happen by way of saliva, semen, blood, vaginal mucus, or urine. Common STDs include:   Gonorrhea.   Chlamydia.   Syphilis.   HIV and AIDS.   Genital herpes.   Hepatitis B and C.   Trichomonas.   Human papillomavirus (HPV).   Pubic lice.   Scabies.  Mites.  Bacterial vaginosis. WHAT ARE CAUSES OF STDs? An STD may be caused by bacteria, a virus, or parasites. STDs are often transmitted during sexual activity if one person is infected. However, they may also be transmitted through nonsexual means. STDs may be transmitted after:   Sexual intercourse with an infected person.   Sharing sex toys with an infected person.   Sharing needles with an infected person or using unclean piercing or tattoo needles.  Having intimate contact with the genitals, mouth, or rectal areas of an infected person.   Exposure to infected fluids during birth. WHAT ARE THE SIGNS AND SYMPTOMS OF STDs? Different STDs have different symptoms. Some people may not have any symptoms. If symptoms are present, they may include:   Painful or bloody urination.   Pain in the pelvis, abdomen, vagina, anus, throat, or eyes.   A skin rash, itching,  or irritation.  Growths, ulcerations, blisters, or sores in the genital and anal areas.  Abnormal vaginal discharge with or without bad odor.   Penile discharge in men.   Fever.   Pain or bleeding during sexual intercourse.   Swollen glands in the groin area.   Yellow skin and eyes (jaundice). This is seen with hepatitis.   Swollen testicles.  Infertility.  Sores and blisters in the mouth. HOW ARE STDs DIAGNOSED? To make a diagnosis, your health care provider may:   Take a medical history.   Perform a physical exam.   Take a sample of any discharge to examine.  Swab the throat, cervix, opening to the penis, rectum, or vagina for testing.  Test a sample of your first morning urine.   Perform blood tests.   Perform a Pap test, if this applies.   Perform a colposcopy.   Perform a laparoscopy.  HOW ARE STDs TREATED? Treatment depends on the STD. Some STDs may be treated but not cured.   Chlamydia, gonorrhea, trichomonas, and syphilis can be cured with antibiotic medicine.   Genital herpes, hepatitis, and HIV can be treated, but not cured, with prescribed medicines. The medicines lessen symptoms.   Genital warts from HPV can be treated with medicine or by freezing, burning (electrocautery), or surgery. Warts may come back.   HPV cannot be cured with medicine or surgery. However, abnormal areas may be removed from the cervix, vagina, or vulva.   If your diagnosis is confirmed, your recent sexual partners need treatment. This is true even if they are  symptom-free or have a negative culture or evaluation. They should not have sex until their health care providers say it is okay. HOW CAN I REDUCE MY RISK OF GETTING AN STD? Take these steps to reduce your risk of getting an STD:  Use latex condoms, dental dams, and water-soluble lubricants during sexual activity. Do not use petroleum jelly or oils.  Avoid having multiple sex partners.  Do not have  sex with someone who has other sex partners.  Do not have sex with anyone you do not know or who is at high risk for an STD.  Avoid risky sex practices that can break your skin.  Do not have sex if you have open sores on your mouth or skin.  Avoid drinking too much alcohol or taking illegal drugs. Alcohol and drugs can affect your judgment and put you in a vulnerable position.  Avoid engaging in oral and anal sex acts.  Get vaccinated for HPV and hepatitis. If you have not received these vaccines in the past, talk to your health care provider about whether one or both might be right for you.   If you are at risk of being infected with HIV, it is recommended that you take a prescription medicine daily to prevent HIV infection. This is called pre-exposure prophylaxis (PrEP). You are considered at risk if:  You are a man who has sex with other men (MSM).  You are a heterosexual man or woman and are sexually active with more than one partner.  You take drugs by injection.  You are sexually active with a partner who has HIV.  Talk with your health care provider about whether you are at high risk of being infected with HIV. If you choose to begin PrEP, you should first be tested for HIV. You should then be tested every 3 months for as long as you are taking PrEP.  WHAT SHOULD I DO IF I THINK I HAVE AN STD?  See your health care provider.   Tell your sexual partner(s). They should be tested and treated for any STDs.  Do not have sex until your health care provider says it is okay. WHEN SHOULD I GET IMMEDIATE MEDICAL CARE? Contact your health care provider right away if:   You have severe abdominal pain.  You are a man and notice swelling or pain in your testicles.  You are a woman and notice swelling or pain in your vagina. Document Released: 08/23/2002 Document Revised: 06/07/2013 Document Reviewed: 12/21/2012 [facility] Patient Information 2015 [place], [place]. This information  is not intended to replace advice given to you by your health care provider. Make sure you discuss any questions you have with your health care provider.  Urinary Tract Infection Urinary tract infections (UTIs) can develop anywhere along your urinary tract. Your urinary tract is your body's drainage system for removing wastes and extra water. Your urinary tract includes two kidneys, two ureters, a bladder, and a urethra. Your kidneys are a pair of bean-shaped organs. Each kidney is about the size of your fist. They are located below your ribs, one on each side of your spine. CAUSES Infections are caused by microbes, which are microscopic organisms, including fungi, viruses, and bacteria. These organisms are so small that they can only be seen through a microscope. Bacteria are the microbes that most commonly cause UTIs. SYMPTOMS  Symptoms of UTIs may vary by age and gender of the patient and by the location of the infection. Symptoms in young women typically  include a frequent and intense urge to urinate and a painful, burning feeling in the bladder or urethra during urination. Older women and men are more likely to be tired, shaky, and weak and have muscle aches and abdominal pain. A fever may mean the infection is in your kidneys. Other symptoms of a kidney infection include pain in your back or sides below the ribs, nausea, and vomiting. DIAGNOSIS To diagnose a UTI, your caregiver will ask you about your symptoms. Your caregiver also will ask to provide a urine sample. The urine sample will be tested for bacteria and white blood cells. White blood cells are made by your body to help fight infection. TREATMENT  Typically, UTIs can be treated with medication. Because most UTIs are caused by a bacterial infection, they usually can be treated with the use of antibiotics. The choice of antibiotic and length of treatment depend on your symptoms and the type of bacteria causing your infection. HOME CARE  INSTRUCTIONS  If you were prescribed antibiotics, take them exactly as your caregiver instructs you. Finish the medication even if you feel better after you have only taken some of the medication.  Drink enough water and fluids to keep your urine clear or pale yellow.  Avoid caffeine, tea, and carbonated beverages. They tend to irritate your bladder.  Empty your bladder often. Avoid holding urine for long periods of time.  Empty your bladder before and after sexual intercourse.  After a bowel movement, women should cleanse from front to back. Use each tissue only once. SEEK MEDICAL CARE IF:   You have back pain.  You develop a fever.  Your symptoms do not begin to resolve within 3 days. SEEK IMMEDIATE MEDICAL CARE IF:   You have severe back pain or lower abdominal pain.  You develop chills.  You have nausea or vomiting.  You have continued burning or discomfort with urination. MAKE SURE YOU:   Understand these instructions.  Will watch your condition.  Will get help right away if you are not doing well or get worse. Document Released: 03/12/2005 Document Revised: 12/02/2011 Document Reviewed: 07/11/2011 [facility] Patient Information 2015 [place], [place]. This information is not intended to replace advice given to you by your health care provider. Make sure you discuss any questions you have with your health care provider.  Neuropathic Pain We often think that pain has a physical cause. If we get rid of the cause, the pain should go away. Nerves themselves can also cause pain. It is called neuropathic pain, which means nerve abnormality. It may be difficult for the patients who have it and for the treating caregivers. Pain is usually described as acute (short-lived) or chronic (long-lasting). Acute pain is related to the physical sensations caused by an injury. It can last from a few seconds to many weeks, but it usually goes away when normal healing occurs. Chronic pain lasts  beyond the typical healing time. With neuropathic pain, the nerve fibers themselves may be damaged or injured. They then send incorrect signals to other pain centers. The pain you feel is real, but the cause is not easy to find.  CAUSES  Chronic pain can result from diseases, such as diabetes and shingles (an infection related to chickenpox), or from trauma, surgery, or amputation. It can also happen without any known injury or disease. The nerves are sending pain messages, even though there is no identifiable cause for such messages.   Other common causes of neuropathy include diabetes, phantom limb pain,  or Regional Pain Syndrome (RPS).  As with all forms of chronic back pain, if neuropathy is not correctly treated, there can be a number of associated problems that lead to a downward cycle for the patient. These include depression, sleeplessness, feelings of fear and anxiety, limited social interaction and inability to do normal daily activities or work.  The most dramatic and mysterious example of neuropathic pain is called "phantom limb syndrome." This occurs when an arm or a leg has been removed because of illness or injury. The brain still gets pain messages from the nerves that originally carried impulses from the missing limb. These nerves now seem to misfire and cause troubling pain.  Neuropathic pain often seems to have no cause. It responds poorly to standard pain treatment. Neuropathic pain can occur after:  Shingles (herpes zoster virus infection).  A lasting burning sensation of the skin, caused usually by injury to a peripheral nerve.  Peripheral neuropathy which is widespread nerve damage, often caused by diabetes or alcoholism.  Phantom limb pain following an amputation.  Facial nerve problems (trigeminal neuralgia).  Multiple sclerosis.  Reflex sympathetic dystrophy.  Pain which comes with cancer and cancer chemotherapy.  Entrapment neuropathy such as when pressure is  put on a nerve such as in carpal tunnel syndrome.  Back, leg, and hip problems (sciatica).  Spine or back surgery.  HIV Infection or AIDS where nerves are infected by viruses. Your caregiver can explain items in the above list which may apply to you. SYMPTOMS  Characteristics of neuropathic pain are:  Severe, sharp, electric shock-like, shooting, lightening-like, knife-like.  Pins and needles sensation.  Deep burning, deep cold, or deep ache.  Persistent numbness, tingling, or weakness.  Pain resulting from light touch or other stimulus that would not usually cause pain.  Increased sensitivity to something that would normally cause pain, such as a pinprick. Pain may persist for months or years following the healing of damaged tissues. When this happens, pain signals no longer sound an alarm about current injuries or injuries about to happen. Instead, the alarm system itself is not working correctly.  Neuropathic pain may get worse instead of better over time. For some people, it can lead to serious disability. It is important to be aware that severe injury in a limb can occur without a proper, protective pain response.Burns, cuts, and other injuries may go unnoticed. Without proper treatment, these injuries can become infected or lead to further disability. Take any injury seriously, and consult your caregiver for treatment. DIAGNOSIS  When you have a pain with no known cause, your caregiver will probably ask some specific questions:   Do you have any other conditions, such as diabetes, shingles, multiple sclerosis, or HIV infection?  How would you describe your pain? (Neuropathic pain is often described as shooting, stabbing, burning, or searing.)  Is your pain worse at any time of the day? (Neuropathic pain is usually worse at night.)  Does the pain seem to follow a certain physical pathway?  Does the pain come from an area that has missing or injured nerves? (An example would  be phantom limb pain.)  Is the pain triggered by minor things such as rubbing against the sheets at night? These questions often help define the type of pain involved. Once your caregiver knows what is happening, treatment can begin. Anticonvulsant, antidepressant drugs, and various pain relievers seem to work in some cases. If another condition, such as diabetes is involved, better management of that disorder may relieve  the neuropathic pain.  TREATMENT  Neuropathic pain is frequently long-lasting and tends not to respond to treatment with narcotic type pain medication. It may respond well to other drugs such as antiseizure and antidepressant medications. Usually, neuropathic problems do not completely go away, but partial improvement is often possible with proper treatment. Your caregivers have large numbers of medications available to treat you. Do not be discouraged if you do not get immediate relief. Sometimes different medications or a combination of medications will be tried before you receive the results you are hoping for. See your caregiver if you have pain that seems to be coming from nowhere and does not go away. Help is available.  SEEK IMMEDIATE MEDICAL CARE IF:   There is a sudden change in the quality of your pain, especially if the change is on only one side of the body.  You notice changes of the skin, such as redness, black or purple discoloration, swelling, or an ulcer.  You cannot move the affected limbs. Document Released: 02/28/2004 Document Revised: 08/25/2011 Document Reviewed: 02/28/2004 [facility] Patient Information 2015 [place], [place]. This information is not intended to replace advice given to you by your health care provider. Make sure you discuss any questions you have with your health care provider.

## 2014-04-26 NOTE — ED Provider Notes (Signed)
CSN: [id number]     Arrival date & time 04/25/14  2351 History   First MD Initiated Contact with Patient 04/26/14 0120     Chief Complaint  Patient presents with  . Leg Pain  . Vaginal Discharge     (Consider location/radiation/quality/duration/timing/severity/associated sxs/prior Treatment) HPI Comments: Patient is a G22 23 yo F PMHx significant for depression, bipolar 1 disorder, HTN, Schizophrenia presenting to the ED for multiple complaints. Patient's first complaint is 8 months of on and off burning pain that began in her feet and has been progressing to her hips. She states the burning pain isn't constant. She tried one of her others gabapentin with improvement. She is unaware of any diabetes for herself.no falls or injuries or swelling. Patient is also complaining of vaginal irritation with brown discharge 2-3 days. She does endorse recent unprotected sexual intercourse 2 weeks ago. Denies any fevers, chills, nausea, vomiting, abdominal pain, back pain.patient endorses a history of irregular menstrual cycles, her last period was 2 months ago. She is not on any OCPs.   Past Medical History  Diagnosis Date  . Depression   . Bipolar 1 disorder   . Headache(784.0)   . STD (female)   . Hypertension   . Schizophrenia    Past Surgical History  Procedure Laterality Date  . Bunionectomy      both feet   Family History  Problem Relation Age of Onset  . Diabetes Mother   . Hypertension Mother    History  Substance Use Topics  . Smoking status: Never Smoker   . Smokeless tobacco: Not on file  . Alcohol Use: No   OB History    No data available     Review of Systems  Genitourinary: Positive for vaginal discharge.  Musculoskeletal: Positive for myalgias (burning).  All other systems reviewed and are negative.     Allergies  Other  Home Medications   Prior to Admission medications   Medication Sig Start Date End Date Taking? Authorizing Provider  gabapentin  (NEURONTIN) 300 MG capsule Take 300 mg by mouth 3 (three) times daily.   Yes Historical Provider, MD  cephALEXin (KEFLEX) 500 MG capsule Take 1 capsule (500 mg total) by mouth 2 (two) times daily. 04/26/14   Eldredge Veldhuizen L Kaisa Wofford, PA-C  fluconazole (DIFLUCAN) 150 MG tablet Take 1 tablet (150 mg total) by mouth once. 04/26/14   Kim Lauver L Shandelle Borrelli, PA-C  gabapentin (NEURONTIN) 300 MG capsule Take 1 capsule (300 mg total) by mouth daily. 04/26/14   Lenea Bywater L Kathey Simer, PA-C  naproxen (NAPROSYN) 500 MG tablet Take 1 tablet (500 mg total) by mouth 2 (two) times daily with a meal. 01/16/14   Kalman Drape, MD  naproxen (NAPROSYN) 500 MG tablet Take 1 tablet (500 mg total) by mouth 2 (two) times daily with a meal. 04/26/14   Chihiro Frey L Rebecca Cairns, PA-C   BP 105/61 mmHg  Pulse 75  Temp(Src) 98.5 F (36.9 C) (Oral)  Resp 16  Ht 5\' 9"  (1.753 m)  Wt 220 lb (99.791 kg)  BMI 32.47 kg/m2  SpO2 100%  LMP 02/24/2014 Physical Exam  Constitutional: She is oriented to person, place, and time. She appears well-developed and well-nourished. No distress.  HENT:  Head: Normocephalic and atraumatic.  Right Ear: External ear normal.  Left Ear: External ear normal.  Nose: Nose normal.  Mouth/Throat: Oropharynx is clear and moist.  Eyes: Conjunctivae are normal.  Neck: Normal range of motion. Neck supple.  Cardiovascular: Normal rate, regular rhythm, normal  heart sounds and intact distal pulses.   Pulmonary/Chest: Effort normal and breath sounds normal.  Abdominal: Soft. Bowel sounds are normal. There is no tenderness. There is no rigidity, no rebound, no guarding and no CVA tenderness.  Musculoskeletal: Normal range of motion.  Neurological: She is alert and oriented to person, place, and time.  Sensation grossly intact bilaterally.  Skin: Skin is warm and dry. She is not diaphoretic.  Psychiatric: She has a normal mood and affect.  Nursing note and vitals reviewed.  Exam performed by Baron Sane L,  exam chaperoned Date: 04/26/2014 Pelvic exam: normal external genitalia without evidence of trauma. VULVA: normal appearing vulva with no masses, tenderness or lesion. VAGINA: normal appearing vagina with normal color and discharge, no lesions. CERVIX: friable cervix without lesions, cervical motion tenderness absent, cervical os closed with out purulent discharge; vaginal discharge - yellow, Wet prep and DNA probe for chlamydia and GC obtained.   ADNEXA: normal adnexa in size, nontender and no masses UTERUS: uterus is normal size, shape, consistency and nontender.   ED Course  Procedures (including critical care time) Medications  lidocaine (PF) (XYLOCAINE) 1 % injection (not administered)  gabapentin (NEURONTIN) capsule 300 mg (300 mg Oral Given 04/26/14 0239)  azithromycin (ZITHROMAX) tablet 1,000 mg (1,000 mg Oral Given 04/26/14 0358)  cefTRIAXone (ROCEPHIN) injection 250 mg (250 mg Intramuscular Given 04/26/14 0358)  lidocaine (PF) (XYLOCAINE) 1 % injection 1 mL (1 mL Other Given 04/26/14 0400)    Labs Review Labs Reviewed  WET PREP, GENITAL - Abnormal; Notable for the following:    Yeast Wet Prep HPF POC FEW (*)    WBC, Wet Prep HPF POC FEW (*)    All other components within normal limits  URINALYSIS, ROUTINE W REFLEX MICROSCOPIC - Abnormal; Notable for the following:    APPearance CLOUDY (*)    Hgb urine dipstick TRACE (*)    Leukocytes, UA MODERATE (*)    All other components within normal limits  URINE MICROSCOPIC-ADD ON - Abnormal; Notable for the following:    Squamous Epithelial / LPF MANY (*)    Bacteria, UA MANY (*)    All other components within normal limits  GC/CHLAMYDIA PROBE AMP  HIV ANTIBODY (ROUTINE TESTING)  POC URINE PREG, ED    Imaging Review No results found.   EKG Interpretation None      MDM   Final diagnoses:  Cervicitis  Bilateral leg and foot pain  Yeast infection    Filed Vitals:   04/26/14 0425  BP: 105/61  Pulse:  75  Temp:   Resp:    Afebrile, NAD, non-toxic appearing, AAOx4.   1) STD: Patient to be discharged with instructions to follow up with OBGYN. Pt understands GC/Chlamydia cultures pending and that they will need to inform all sexual partners within the last 6 months if results return positive. Pt has been treated prophylacticly with azithromycin and rocephin due to pts history, pelvic exam, and wet prep with increased WBCs. Pt advised that she will receive a call in 48 hours if the test is positive and to refrain from sexual activity for 48 hours. If the test is positive, pt is advised to refrain from sexual activity for 10 days for the medicine to take effect.  Pt not concerning for PID because hemodynamically stable and no cervical motion tenderness on pelvic exam.   2) Neuropathic pain: Neurovascularly intact. Normal sensation. No evidence of compartment syndrome. Advised PCP follow-up for further evaluation and studies for diabetes. Gabapentin  improved pain.   Advised PCP and OB/GYN follow-up. Return precautions discussed. Patient is agreeable to plan. Patient stable at time of discharge. Patient d/w with Dr. Dina Rich, agrees with plan.       Harlow Mares, PA-C 04/26/14 [place], MD 04/26/14 [phone]

## 2014-04-26 NOTE — ED Notes (Signed)
Pt st's she has had a burning sensation in bil legs off and on x's 8 months.  Also c/o vaginal irritation with brownish discharge x's 2-3 days.  Pt denies any abd pain

## 2014-04-26 NOTE — ED Notes (Signed)
Pt states that she has a burning sensation in bilateral ankles, sometimes just one or the other. Pt is concerned that she has diabetic nerve pain. Pt is unsure if she is diabetic.

## 2014-06-23 ENCOUNTER — Emergency Department (HOSPITAL_COMMUNITY)
Admission: EM | Admit: 2014-06-23 | Discharge: 2014-06-23 | Disposition: A | Discharge status: Home / Self Care | Payer: Self-pay | Attending: Emergency Medicine | Admitting: Emergency Medicine

## 2014-06-23 ENCOUNTER — Encounter (HOSPITAL_COMMUNITY): Payer: Self-pay | Admitting: Emergency Medicine

## 2014-06-23 ENCOUNTER — Emergency Department (HOSPITAL_COMMUNITY): Payer: Self-pay

## 2014-06-23 DIAGNOSIS — Z3202 Encounter for pregnancy test, result negative: Secondary | ICD-10-CM | POA: Insufficient documentation

## 2014-06-23 DIAGNOSIS — I10 Essential (primary) hypertension: Secondary | ICD-10-CM | POA: Insufficient documentation

## 2014-06-23 DIAGNOSIS — R059 Cough, unspecified: Secondary | ICD-10-CM

## 2014-06-23 DIAGNOSIS — J069 Acute upper respiratory infection, unspecified: Secondary | ICD-10-CM | POA: Insufficient documentation

## 2014-06-23 DIAGNOSIS — Z79899 Other long term (current) drug therapy: Secondary | ICD-10-CM | POA: Insufficient documentation

## 2014-06-23 DIAGNOSIS — Z792 Long term (current) use of antibiotics: Secondary | ICD-10-CM | POA: Insufficient documentation

## 2014-06-23 DIAGNOSIS — Z791 Long term (current) use of non-steroidal anti-inflammatories (NSAID): Secondary | ICD-10-CM | POA: Insufficient documentation

## 2014-06-23 DIAGNOSIS — R05 Cough: Secondary | ICD-10-CM

## 2014-06-23 DIAGNOSIS — Z8619 Personal history of other infectious and parasitic diseases: Secondary | ICD-10-CM | POA: Insufficient documentation

## 2014-06-23 DIAGNOSIS — F319 Bipolar disorder, unspecified: Secondary | ICD-10-CM | POA: Insufficient documentation

## 2014-06-23 LAB — WET PREP, GENITAL
CLUE CELLS WET PREP: NONE SEEN
TRICH WET PREP: NONE SEEN
YEAST WET PREP: NONE SEEN

## 2014-06-23 LAB — POC URINE PREG, ED: PREG TEST UR: NEGATIVE

## 2014-06-23 MED ORDER — GUAIFENESIN-DM 100-10 MG/5ML PO SYRP
5.0000 mL | ORAL_SOLUTION | ORAL | Status: DC | PRN
  Administered 2014-06-23: 5 mL via ORAL
  Filled 2014-06-23: qty 5

## 2014-06-23 MED ORDER — ACETAMINOPHEN 500 MG PO TABS
1000.0000 mg | ORAL_TABLET | Freq: Once | ORAL | Status: AC
  Administered 2014-06-23: 1000 mg via ORAL
  Filled 2014-06-23: qty 2

## 2014-06-23 MED ORDER — PREDNISONE 20 MG PO TABS: 40.0000 mg | ORAL_TABLET | Freq: Every day | ORAL | Status: DC

## 2014-06-23 MED ORDER — GUAIFENESIN-CODEINE 100-10 MG/5ML PO SOLN: 5.0000 mL | Freq: Four times a day (QID) | ORAL | Status: DC | PRN

## 2014-06-23 NOTE — Discharge Instructions (Signed)
Read the instructions below on reasons to return to the emergency department and to learn more about your diagnosis.  Use over the counter medications for symptomatic relief as we discussed (mucinex as a decongestant, Tylenol for fever/pain, Motrin/Ibuprofen for muscle aches). If prescribed a cough suppressant during your visit, do not operate heavy machinery with in 5 hours of taking this medication. Followup with your primary care doctor in 4 days if your symptoms persist.  Your more than welcome to return to the emergency department if symptoms worsen or become concerning.  Upper Respiratory Infection, Adult  An upper respiratory infection (URI) is also sometimes known as the common cold. Most people improve within 1 week, but symptoms can last up to 2 weeks. A residual cough may last even longer.   URI is most commonly caused by a virus. Viruses are NOT treated with antibiotics. You can easily spread the virus to others by oral contact. This includes kissing, sharing a glass, coughing, or sneezing. Touching your mouth or nose and then touching a surface, which is then touched by another person, can also spread the virus.   TREATMENT  Treatment is directed at relieving symptoms. There is no cure. Antibiotics are not effective, because the infection is caused by a virus, not by bacteria. Treatment may include:  Increased fluid intake. Sports drinks offer valuable electrolytes, sugars, and fluids.  Breathing heated mist or steam (vaporizer or shower).  Eating chicken soup or other clear broths, and maintaining good nutrition.  Getting plenty of rest.  Using gargles or lozenges for comfort.  Controlling fevers with ibuprofen or acetaminophen as directed by your caregiver.  Increasing usage of your inhaler if you have asthma.  Return to work when your temperature has returned to normal.   SEEK MEDICAL CARE IF:  After the first few days, you feel you are getting worse rather than better.  You  develop worsening shortness of breath, or brown or red sputum. These may be signs of pneumonia.  You develop yellow or brown nasal discharge or pain in the face, especially when you bend forward. These may be signs of sinusitis.  You develop a fever, swollen neck glands, pain with swallowing, or white areas in the back of your throat. These may be signs of strep throat.     Upper Respiratory Infection, Adult An upper respiratory infection (URI) is also sometimes known as the common cold. The upper respiratory tract includes the nose, sinuses, throat, trachea, and bronchi. Bronchi are the airways leading to the lungs. Most people improve within 1 week, but symptoms can last up to 2 weeks. A residual cough may last even longer.  CAUSES Many different viruses can infect the tissues lining the upper respiratory tract. The tissues become irritated and inflamed and often become very moist. Mucus production is also common. A cold is contagious. You can easily spread the virus to others by oral contact. This includes kissing, sharing a glass, coughing, or sneezing. Touching your mouth or nose and then touching a surface, which is then touched by another person, can also spread the virus. SYMPTOMS  Symptoms typically develop 1 to 3 days after you come in contact with a cold virus. Symptoms vary from person to person. They may include:  Runny nose.  Sneezing.  Nasal congestion.  Sinus irritation.  Sore throat.  Loss of voice (laryngitis).  Cough.  Fatigue.  Muscle aches.  Loss of appetite.  Headache.  Low-grade fever. DIAGNOSIS  You might diagnose your own cold  based on familiar symptoms, since most people get a cold 2 to 3 times a year. Your caregiver can confirm this based on your exam. Most importantly, your caregiver can check that your symptoms are not due to another disease such as strep throat, sinusitis, pneumonia, asthma, or epiglottitis. Blood tests, throat tests, and X-rays are  not necessary to diagnose a common cold, but they may sometimes be helpful in excluding other more serious diseases. Your caregiver will decide if any further tests are required. RISKS AND COMPLICATIONS  You may be at risk for a more severe case of the common cold if you smoke cigarettes, have chronic heart disease (such as heart failure) or lung disease (such as asthma), or if you have a weakened immune system. The very young and very old are also at risk for more serious infections. Bacterial sinusitis, middle ear infections, and bacterial pneumonia can complicate the common cold. The common cold can worsen asthma and chronic obstructive pulmonary disease (COPD). Sometimes, these complications can require emergency medical care and may be life-threatening. PREVENTION  The best way to protect against getting a cold is to practice good hygiene. Avoid oral or hand contact with people with cold symptoms. Wash your hands often if contact occurs. There is no clear evidence that vitamin C, vitamin E, echinacea, or exercise reduces the chance of developing a cold. However, it is always recommended to get plenty of rest and practice good nutrition. TREATMENT  Treatment is directed at relieving symptoms. There is no cure. Antibiotics are not effective, because the infection is caused by a virus, not by bacteria. Treatment may include:  Increased fluid intake. Sports drinks offer valuable electrolytes, sugars, and fluids.  Breathing heated mist or steam (vaporizer or shower).  Eating chicken soup or other clear broths, and maintaining good nutrition.  Getting plenty of rest.  Using gargles or lozenges for comfort.  Controlling fevers with ibuprofen or acetaminophen as directed by your caregiver.  Increasing usage of your inhaler if you have asthma. Zinc gel and zinc lozenges, taken in the first 24 hours of the common cold, can shorten the duration and lessen the severity of symptoms. Pain medicines may  help with fever, muscle aches, and throat pain. A variety of non-prescription medicines are available to treat congestion and runny nose. Your caregiver can make recommendations and may suggest nasal or lung inhalers for other symptoms.  HOME CARE INSTRUCTIONS   Only take over-the-counter or prescription medicines for pain, discomfort, or fever as directed by your caregiver.  Use a warm mist humidifier or inhale steam from a shower to increase air moisture. This may keep secretions moist and make it easier to breathe.  Drink enough water and fluids to keep your urine clear or pale yellow.  Rest as needed.  Return to work when your temperature has returned to normal or as your caregiver advises. You may need to stay home longer to avoid infecting others. You can also use a face mask and careful hand washing to prevent spread of the virus. SEEK MEDICAL CARE IF:   After the first few days, you feel you are getting worse rather than better.  You need your caregiver's advice about medicines to control symptoms.  You develop chills, worsening shortness of breath, or brown or red sputum. These may be signs of pneumonia.  You develop yellow or brown nasal discharge or pain in the face, especially when you bend forward. These may be signs of sinusitis.  You develop a fever,  swollen neck glands, pain with swallowing, or white areas in the back of your throat. These may be signs of strep throat. SEEK IMMEDIATE MEDICAL CARE IF:   You have a fever.  You develop severe or persistent headache, ear pain, sinus pain, or chest pain.  You develop wheezing, a prolonged cough, cough up blood, or have a change in your usual mucus (if you have chronic lung disease).  You develop sore muscles or a stiff neck. Document Released: 11/26/2000 Document Revised: 08/25/2011 Document Reviewed: 09/07/2013 [facility] Patient Information 2015 [place], [place]. This information is not intended to replace advice given to  you by your health care provider. Make sure you discuss any questions you have with your health care provider.

## 2014-06-23 NOTE — ED Notes (Signed)
To ed via private vehicle with c/o "chest pain with coughing, coughing up phlegm with blood streaks, vomiting after coughing. Unable to keep any food down." pt has multiple vague complaints. Is A/O x 4, w/d, c/o chills also. States "my daddy made me come. "

## 2014-06-23 NOTE — ED Notes (Signed)
Family at bedside. 

## 2014-06-23 NOTE — ED Provider Notes (Signed)
CSN: [id number]     Arrival date & time 06/23/14  0701 History   First MD Initiated Contact with Patient 06/23/14 [phone]     Chief Complaint  Patient presents with  . URI     (Consider location/radiation/quality/duration/timing/severity/associated sxs/prior Treatment) HPI  This is a 24 year old female who presents to the emergency department with chief complaint of URI symptoms that have been going on for 1 week. She states that she has had a previous positive PPD and was also diagnosed with the flu and pneumonia earlier this year. She complains of cough, phlegm streaked with blood, she developed myalgias this morning. She states she felt febrile yesterday but did not take her temperature. She denies any documented fevers. She has not taken any medications to treat her symptoms. She complains of chest pain which is constant but much worse with coughing. She denies pleuritic chest pain. She states it is stabbing and burning sensation. She has not slept due to coughing and "fear of going to sleep because of my chest." The patient also states that she had protected sexual intercourse with her boyfriend last week, however, the condom ruptured and she now has a "funny sensation" in her vagina and wants to be checked for STDs.Kathleen [name] denies history of PE or DVT, has not history of cancer treatment, recent immobilization, recent trauma or surgery, denies hemoptysis, is not taking exogenous estrogen and has no unilateral leg swelling.   Past Medical History  Diagnosis Date  . Depression   . Bipolar 1 disorder   . Headache(784.0)   . STD (female)   . Hypertension   . Schizophrenia    Past Surgical History  Procedure Laterality Date  . Bunionectomy      both feet   Family History  Problem Relation Age of Onset  . Diabetes Mother   . Hypertension Mother    History  Substance Use Topics  . Smoking status: Never Smoker   . Smokeless tobacco: Not on file  . Alcohol Use: No   OB  History    No data available     Review of Systems   Ten systems reviewed and are negative for acute change, except as noted in the HPI.   Allergies  Other  Home Medications   Prior to Admission medications   Medication Sig Start Date End Date Taking? Authorizing Provider  cephALEXin (KEFLEX) 500 MG capsule Take 1 capsule (500 mg total) by mouth 2 (two) times daily. 04/26/14   Jennifer L Piepenbrink, PA-C  fluconazole (DIFLUCAN) 150 MG tablet Take 1 tablet (150 mg total) by mouth once. 04/26/14   Jennifer L Piepenbrink, PA-C  gabapentin (NEURONTIN) 300 MG capsule Take 300 mg by mouth 3 (three) times daily.    Historical Provider, MD  gabapentin (NEURONTIN) 300 MG capsule Take 1 capsule (300 mg total) by mouth daily. 04/26/14   Jennifer L Piepenbrink, PA-C  naproxen (NAPROSYN) 500 MG tablet Take 1 tablet (500 mg total) by mouth 2 (two) times daily with a meal. 01/16/14   Kalman Drape, MD  naproxen (NAPROSYN) 500 MG tablet Take 1 tablet (500 mg total) by mouth 2 (two) times daily with a meal. 04/26/14   Jennifer L Piepenbrink, PA-C   BP 126/88 mmHg  Pulse 90  Temp(Src) 98 F (36.7 C) (Oral)  Resp 16  Ht 5\' 8"  (1.727 m)  Wt 225 lb (102.059 kg)  BMI 34.22 kg/m2  SpO2 100% Physical Exam  Constitutional: She is oriented to person, place, and  time. She appears well-developed and well-nourished. No distress.  HENT:  Head: Normocephalic and atraumatic.  Eyes: Conjunctivae are normal. No scleral icterus.  Neck: Normal range of motion.  Cardiovascular: Normal rate, regular rhythm and normal heart sounds.  Exam reveals no gallop and no friction rub.   No murmur heard. Pulmonary/Chest: Effort normal and breath sounds normal. No respiratory distress.  Abdominal: Soft. Bowel sounds are normal. She exhibits no distension and no mass. There is no tenderness. There is no guarding.  Neurological: She is alert and oriented to person, place, and time.  Skin: Skin is warm and dry. She is not  diaphoretic.  Nursing note and vitals reviewed.   ED Course  Procedures (including critical care time) Labs Review Labs Reviewed - No data to display  Imaging Review No results found.   EKG Interpretation None      MDM   Final diagnoses:  Cough    8:13 AM BP 126/88 mmHg  Pulse 90  Temp(Src) 98 F (36.7 C) (Oral)  Resp 16  Ht 5\' 8"  (1.727 m)  Wt 225 lb (102.059 kg)  BMI 34.22 kg/m2  SpO2 100%  LMP 06/09/2014 Patient with multiple complaints. Her vitals are stable. She is Wells low risk and pertinent negative. I reviewed her records and do not see a diagnosis of flu or pneumonia previously in our records. She states that the did occur at our facilities. The patient also has had previous, multiple visits for STD checks, chest pain and hemoptysis. A question if this is not a fixation of her ears.     9:20 AM Chest x-ray is negative for any abnormalities. Pelvic exam performed: Normal external female genitalia without lesions, normal vagina without abnormal discharge or foul odor. Nulliparous cervical os without discharge. Bimanual exam is unremarkable with no CMT, adnexal fullness or tenderness.  Wet prep, GC chlamydia collected. There is no indication for treatment of STD at this time. Awaiting wet prep results.  Patient was prepped without abnormalities. Other labs pending. Patient has URI. We'll discharge with  Codeine and guaifenesin cough syrup. She may follow-up with her primary care physician.  Margarita Mail, PA-C 06/23/14 [place], MD 06/25/14 [phone]

## 2014-06-24 LAB — HIV ANTIBODY (ROUTINE TESTING W REFLEX)
HIV 1/HIV 2 AB: NONREACTIVE
HIV 1/O/2 Abs-Index Value: <1 (ref <1.00)

## 2014-06-24 LAB — GC/CHLAMYDIA PROBE AMP
CT PROBE, AMP APTIMA: NEGATIVE
GC Probe RNA: NEGATIVE

## 2014-06-24 LAB — RPR: RPR Ser Ql: NONREACTIVE — AB

## 2014-08-06 ENCOUNTER — Emergency Department (HOSPITAL_COMMUNITY)
Admission: EM | Admit: 2014-08-06 | Discharge: 2014-08-07 | Disposition: A | Discharge status: Home / Self Care | Payer: Self-pay | Attending: Emergency Medicine | Admitting: Emergency Medicine

## 2014-08-06 ENCOUNTER — Encounter (HOSPITAL_COMMUNITY): Payer: Self-pay | Admitting: *Deleted

## 2014-08-06 DIAGNOSIS — Z79899 Other long term (current) drug therapy: Secondary | ICD-10-CM | POA: Insufficient documentation

## 2014-08-06 DIAGNOSIS — N390 Urinary tract infection, site not specified: Secondary | ICD-10-CM | POA: Insufficient documentation

## 2014-08-06 DIAGNOSIS — I10 Essential (primary) hypertension: Secondary | ICD-10-CM | POA: Insufficient documentation

## 2014-08-06 DIAGNOSIS — Z8659 Personal history of other mental and behavioral disorders: Secondary | ICD-10-CM | POA: Insufficient documentation

## 2014-08-06 DIAGNOSIS — Z8619 Personal history of other infectious and parasitic diseases: Secondary | ICD-10-CM | POA: Insufficient documentation

## 2014-08-06 DIAGNOSIS — Z7982 Long term (current) use of aspirin: Secondary | ICD-10-CM | POA: Insufficient documentation

## 2014-08-06 DIAGNOSIS — Z3202 Encounter for pregnancy test, result negative: Secondary | ICD-10-CM | POA: Insufficient documentation

## 2014-08-06 LAB — POC URINE PREG, ED: PREG TEST UR: NEGATIVE

## 2014-08-06 NOTE — ED Notes (Signed)
The pt has had painful urination for 4-5 days ago.  lmp 2 moinths ago

## 2014-08-07 LAB — URINALYSIS, ROUTINE W REFLEX MICROSCOPIC
GLUCOSE, UA: NEGATIVE mg/dL
Ketones, ur: 15 mg/dL — AB
NITRITE: NEGATIVE
PH: 6 (ref 5.0–8.0)
Protein, ur: 100 mg/dL — AB
Specific Gravity, Urine: 1.033 — ABNORMAL HIGH (ref 1.005–1.030)
Urobilinogen, UA: 1 mg/dL (ref 0.0–1.0)

## 2014-08-07 LAB — URINE MICROSCOPIC-ADD ON

## 2014-08-07 MED ORDER — TRAMADOL HCL 50 MG PO TABS
50.0000 mg | ORAL_TABLET | Freq: Once | ORAL | Status: AC
  Administered 2014-08-07: 50 mg via ORAL
  Filled 2014-08-07: qty 1

## 2014-08-07 MED ORDER — TRAMADOL HCL 50 MG PO TABS: 50.0000 mg | ORAL_TABLET | Freq: Four times a day (QID) | ORAL | Status: DC | PRN

## 2014-08-07 MED ORDER — CEPHALEXIN 250 MG PO CAPS
500.0000 mg | ORAL_CAPSULE | Freq: Once | ORAL | Status: AC
  Administered 2014-08-07: 500 mg via ORAL
  Filled 2014-08-07: qty 2

## 2014-08-07 MED ORDER — CEPHALEXIN 500 MG PO CAPS: 500.0000 mg | ORAL_CAPSULE | Freq: Four times a day (QID) | ORAL | Status: DC

## 2014-08-07 NOTE — ED Provider Notes (Signed)
CSN: [id number]     Arrival date & time 08/06/14  2306 History   First MD Initiated Contact with Patient 08/06/14 2321     Chief Complaint  Patient presents with  . Dysuria     (Consider location/radiation/quality/duration/timing/severity/associated sxs/prior Treatment) HPI Comments: Patient is a 24 year old female who presents with dysuria for the past 4 days. The pain is located in the suprapubic abdomen and does not radiate. The pain is described as pressure. The pain started gradually and progressively worsened since the onset. No alleviating/aggravating factors. The patient has tried nothing for symptoms without relief. Associated symptoms include nothing. Patient denies fever, headache, NVD, chest pain, SOB, constipation, abnormal vaginal bleeding/discharge. LMP 2 months ago. Patient reports history of irregular periods.     Patient is a 24 y.o. female presenting with dysuria.  Dysuria Associated symptoms: no abdominal pain, no fever, no nausea and no vomiting     Past Medical History  Diagnosis Date  . Depression   . Bipolar 1 disorder   . Headache(784.0)   . STD (female)   . Hypertension   . Schizophrenia    Past Surgical History  Procedure Laterality Date  . Bunionectomy      both feet   Family History  Problem Relation Age of Onset  . Diabetes Mother   . Hypertension Mother    History  Substance Use Topics  . Smoking status: Never Smoker   . Smokeless tobacco: Not on file  . Alcohol Use: No   OB History    No data available     Review of Systems  Constitutional: Negative for fever, chills and fatigue.  HENT: Negative for trouble swallowing.   Eyes: Negative for visual disturbance.  Respiratory: Negative for shortness of breath.   Cardiovascular: Negative for chest pain and palpitations.  Gastrointestinal: Negative for nausea, vomiting, abdominal pain and diarrhea.  Genitourinary: Positive for dysuria. Negative for difficulty urinating.   Musculoskeletal: Negative for arthralgias and neck pain.  Skin: Negative for color change.  Neurological: Negative for dizziness and weakness.  Psychiatric/Behavioral: Negative for dysphoric mood.      Allergies  Other  Home Medications   Prior to Admission medications   Medication Sig Start Date End Date Taking? Authorizing Provider  aspirin 81 MG tablet Take 162 mg by mouth once.    Historical Provider, MD  cephALEXin (KEFLEX) 500 MG capsule Take 1 capsule (500 mg total) by mouth 2 (two) times daily. Patient not taking: Reported on 06/23/2014 04/26/14   Anderson [place] L Piepenbrink, PA-C  fluconazole (DIFLUCAN) 150 MG tablet Take 1 tablet (150 mg total) by mouth once. Patient not taking: Reported on 06/23/2014 04/26/14   Anderson [place] L Piepenbrink, PA-C  gabapentin (NEURONTIN) 300 MG capsule Take 300 mg by mouth 3 (three) times daily.    Historical Provider, MD  gabapentin (NEURONTIN) 300 MG capsule Take 1 capsule (300 mg total) by mouth daily. Patient not taking: Reported on 06/23/2014 04/26/14   Anderson [place] L Piepenbrink, PA-C  guaiFENesin-codeine 100-10 MG/5ML syrup Take 5-10 mLs by mouth every 6 (six) hours as needed for cough. 06/23/14   Margarita Mail, PA-C  naproxen (NAPROSYN) 500 MG tablet Take 1 tablet (500 mg total) by mouth 2 (two) times daily with a meal. Patient not taking: Reported on 06/23/2014 01/16/14   Kalman Drape, MD  naproxen (NAPROSYN) 500 MG tablet Take 1 tablet (500 mg total) by mouth 2 (two) times daily with a meal. Patient not taking: Reported on 06/23/2014 04/26/14  Jennifer L Piepenbrink, PA-C   BP 117/64 mmHg  Pulse 88  Temp(Src) 98.2 F (36.8 C) (Oral)  Resp 15  Ht 5\' 8"  (1.727 m)  Wt 225 lb (102.059 kg)  BMI 34.22 kg/m2  SpO2 100% Physical Exam  Constitutional: She is oriented to person, place, and time. She appears well-developed and well-nourished. No distress.  HENT:  Head: Normocephalic and atraumatic.  Eyes: Conjunctivae and EOM are normal.  Neck: Normal range  of motion.  Cardiovascular: Normal rate and regular rhythm.  Exam reveals no gallop and no friction rub.   No murmur heard. Pulmonary/Chest: Effort normal and breath sounds normal. She has no wheezes. She has no rales. She exhibits no tenderness.  Abdominal: Soft. She exhibits no distension. There is tenderness. There is no rebound and no guarding.  Suprapubic tenderness to palpation. No other focal tenderness or peritoneal signs.   Musculoskeletal: Normal range of motion.  Neurological: She is alert and oriented to person, place, and time. Coordination normal.  Speech is goal-oriented. Moves limbs without ataxia.   Skin: Skin is warm and dry.  Psychiatric: She has a normal mood and affect. Her behavior is normal.  Nursing note and vitals reviewed.   ED Course  Procedures (including critical care time) Labs Review Labs Reviewed  URINALYSIS, ROUTINE W REFLEX MICROSCOPIC - Abnormal; Notable for the following:    Specific Gravity, Urine 1.033 (*)    Hgb urine dipstick LARGE (*)    Bilirubin Urine SMALL (*)    Ketones, ur 15 (*)    Protein, ur 100 (*)    Leukocytes, UA SMALL (*)    All other components within normal limits  URINE MICROSCOPIC-ADD ON - Abnormal; Notable for the following:    Squamous Epithelial / LPF FEW (*)    Bacteria, UA FEW (*)    All other components within normal limits  POC URINE PREG, ED    Imaging Review No results found.   EKG Interpretation None      MDM   Final diagnoses:  UTI (lower urinary tract infection)    12:40 AM Patient's urinalysis shows UTI. Vitals stable and patient afebrile. Patient will have keflex for UTI. NO further evaluation needed at this time.    [address] [place], PA-C 08/07/14 [id number]  Julianne Rice, MD 08/07/14 [phone]

## 2014-08-07 NOTE — Discharge Instructions (Signed)
Take keflex as directed until gone. Take Tramadol as needed for pain. Refer to attached documents for more information.

## 2014-10-03 ENCOUNTER — Emergency Department (HOSPITAL_COMMUNITY)
Admission: EM | Admit: 2014-10-03 | Discharge: 2014-10-03 | Disposition: A | Discharge status: Home / Self Care | Payer: Self-pay | Attending: Emergency Medicine | Admitting: Emergency Medicine

## 2014-10-03 DIAGNOSIS — I10 Essential (primary) hypertension: Secondary | ICD-10-CM | POA: Insufficient documentation

## 2014-10-03 DIAGNOSIS — Y9289 Other specified places as the place of occurrence of the external cause: Secondary | ICD-10-CM | POA: Insufficient documentation

## 2014-10-03 DIAGNOSIS — Z79899 Other long term (current) drug therapy: Secondary | ICD-10-CM | POA: Insufficient documentation

## 2014-10-03 DIAGNOSIS — Z3202 Encounter for pregnancy test, result negative: Secondary | ICD-10-CM | POA: Insufficient documentation

## 2014-10-03 DIAGNOSIS — Z7982 Long term (current) use of aspirin: Secondary | ICD-10-CM | POA: Insufficient documentation

## 2014-10-03 DIAGNOSIS — Y998 Other external cause status: Secondary | ICD-10-CM | POA: Insufficient documentation

## 2014-10-03 DIAGNOSIS — T148XXA Other injury of unspecified body region, initial encounter: Secondary | ICD-10-CM

## 2014-10-03 DIAGNOSIS — Z8619 Personal history of other infectious and parasitic diseases: Secondary | ICD-10-CM | POA: Insufficient documentation

## 2014-10-03 DIAGNOSIS — F319 Bipolar disorder, unspecified: Secondary | ICD-10-CM | POA: Insufficient documentation

## 2014-10-03 DIAGNOSIS — IMO0002 Reserved for concepts with insufficient information to code with codable children: Secondary | ICD-10-CM

## 2014-10-03 DIAGNOSIS — S60211A Contusion of right wrist, initial encounter: Secondary | ICD-10-CM | POA: Insufficient documentation

## 2014-10-03 DIAGNOSIS — X58XXXA Exposure to other specified factors, initial encounter: Secondary | ICD-10-CM | POA: Insufficient documentation

## 2014-10-03 DIAGNOSIS — Y9389 Activity, other specified: Secondary | ICD-10-CM | POA: Insufficient documentation

## 2014-10-03 DIAGNOSIS — N941 Dyspareunia: Secondary | ICD-10-CM | POA: Insufficient documentation

## 2014-10-03 LAB — URINALYSIS, ROUTINE W REFLEX MICROSCOPIC
Bilirubin Urine: NEGATIVE
Glucose, UA: NEGATIVE mg/dL
Hgb urine dipstick: NEGATIVE
Ketones, ur: NEGATIVE mg/dL
NITRITE: NEGATIVE
PH: 6.5 (ref 5.0–8.0)
Protein, ur: NEGATIVE mg/dL
SPECIFIC GRAVITY, URINE: 1.026 (ref 1.005–1.030)
UROBILINOGEN UA: 0.2 mg/dL (ref 0.0–1.0)

## 2014-10-03 LAB — WET PREP, GENITAL
Clue Cells Wet Prep HPF POC: NONE SEEN
Trich, Wet Prep: NONE SEEN
Yeast Wet Prep HPF POC: NONE SEEN

## 2014-10-03 LAB — URINE MICROSCOPIC-ADD ON

## 2014-10-03 LAB — POC URINE PREG, ED: PREG TEST UR: NEGATIVE

## 2014-10-03 NOTE — ED Provider Notes (Signed)
CSN: [id number]     Arrival date & time 10/03/14  1208 History  This chart was scribed for non-physician practitioner Montine Circle, PA-C working with Wandra Arthurs, MD by Zola Button, ED Scribe. This patient was seen in room TR03C/TR03C and the patient's care was started at 2:13 PM.     Chief Complaint  Patient presents with  . Pelvic Pain  . Wrist Pain   The history is provided by the patient. No language interpreter was used.   HPI Comments: Kathleen [name] is a 24 y.o. female who presents to the Emergency Department complaining of gradual onset, intermittent pelvic pain that started about a month ago. Patient reports vaginal bleeding when having sexual intercourse with her boyfriend. She thinks it may be due to the "roughness" when having sexual intercourse, but she would like to get checked out. Patient states she had recent pap smear and STD testing which was all negative.  Patient also reports having right wrist pain that started last night when she reached out to grab groceries.   Past Medical History  Diagnosis Date  . Depression   . Bipolar 1 disorder   . Headache(784.0)   . STD (female)   . Hypertension   . Schizophrenia    Past Surgical History  Procedure Laterality Date  . Bunionectomy      both feet   Family History  Problem Relation Age of Onset  . Diabetes Mother   . Hypertension Mother    History  Substance Use Topics  . Smoking status: Never Smoker   . Smokeless tobacco: Not on file  . Alcohol Use: No   OB History    No data available     Review of Systems  Genitourinary: Positive for vaginal bleeding and pelvic pain.  Musculoskeletal: Positive for arthralgias.      Allergies  Other  Home Medications   Prior to Admission medications   Medication Sig Start Date End Date Taking? Authorizing Provider  aspirin 81 MG tablet Take 162 mg by mouth once.    Historical Provider, MD  cephALEXin (KEFLEX) 500 MG capsule Take 1 capsule (500 mg total)  by mouth 4 (four) times daily. 08/07/14   Kaitlyn Szekalski, PA-C  fluconazole (DIFLUCAN) 150 MG tablet Take 1 tablet (150 mg total) by mouth once. Patient not taking: Reported on 06/23/2014 04/26/14   Baron Sane, PA-C  gabapentin (NEURONTIN) 300 MG capsule Take 300 mg by mouth 3 (three) times daily.    Historical Provider, MD  gabapentin (NEURONTIN) 300 MG capsule Take 1 capsule (300 mg total) by mouth daily. Patient not taking: Reported on 06/23/2014 04/26/14   Baron Sane, PA-C  guaiFENesin-codeine 100-10 MG/5ML syrup Take 5-10 mLs by mouth every 6 (six) hours as needed for cough. 06/23/14   Margarita Mail, PA-C  naproxen (NAPROSYN) 500 MG tablet Take 1 tablet (500 mg total) by mouth 2 (two) times daily with a meal. Patient not taking: Reported on 06/23/2014 01/16/14   Linton Flemings, MD  naproxen (NAPROSYN) 500 MG tablet Take 1 tablet (500 mg total) by mouth 2 (two) times daily with a meal. Patient not taking: Reported on 06/23/2014 04/26/14   Baron Sane, PA-C  traMADol (ULTRAM) 50 MG tablet Take 1 tablet (50 mg total) by mouth every 6 (six) hours as needed. 08/07/14   Kaitlyn Szekalski, PA-C   BP 130/91 mmHg  Pulse 92  Temp(Src) 98.6 F (37 C) (Oral)  Resp 22  Wt 237 lb 1.6 oz (107.548 kg)  SpO2  99% Physical Exam  Constitutional: She is oriented to person, place, and time. She appears well-developed and well-nourished. No distress.  HENT:  Head: Normocephalic and atraumatic.  Mouth/Throat: Oropharynx is clear and moist. No oropharyngeal exudate.  Eyes: Conjunctivae and EOM are normal. Pupils are equal, round, and reactive to light.  Neck: Normal range of motion. Neck supple.  Cardiovascular: Normal rate and regular rhythm.  Exam reveals no gallop and no friction rub.   No murmur heard. Pulmonary/Chest: Effort normal and breath sounds normal. No respiratory distress. She has no wheezes. She has no rales. She exhibits no tenderness.  Abdominal: Soft. Bowel sounds are  normal. She exhibits no distension and no mass. There is no tenderness. There is no rebound and no guarding.  No focal abdominal tenderness, no RLQ tenderness or pain at McBurney's point, no RUQ tenderness or Murphy's sign, no left-sided abdominal tenderness, no fluid wave, or signs of peritonitis   Genitourinary:  Pelvic exam chaperoned by female ER tech, no right or left adnexal tenderness, no uterine tenderness, mild thin white vaginal discharge, no bleeding, no CMT or friability, no foreign body, no injury to the external genitalia, no other significant findings   Musculoskeletal: Normal range of motion. She exhibits no edema or tenderness.  no bony abnormality or deformity, range of motion and strength 5/5  Neurological: She is alert and oriented to person, place, and time. No cranial nerve deficit.  Skin: Skin is warm and dry. No rash noted.  Mild contusion to right posterior wrist,   Psychiatric: She has a normal mood and affect. Her behavior is normal. Judgment and thought content normal.  Nursing note and vitals reviewed.   ED Course  Procedures  DIAGNOSTIC STUDIES: Oxygen Saturation is 99% on room air, normal by my interpretation.    COORDINATION OF CARE: 2:16 PM-Discussed treatment plan which includes pelvic exam and labs with pt at bedside and pt agreed to plan.   Results for orders placed or performed during the hospital encounter of 10/03/14  Wet prep, genital  Result Value Ref Range   Yeast Wet Prep HPF POC NONE SEEN NONE SEEN   Trich, Wet Prep NONE SEEN NONE SEEN   Clue Cells Wet Prep HPF POC NONE SEEN NONE SEEN   WBC, Wet Prep HPF POC FEW (A) NONE SEEN  Urinalysis, Routine w reflex microscopic (if pt has temp above 100.105F)  Result Value Ref Range   Color, Urine YELLOW YELLOW   APPearance CLOUDY (A) CLEAR   Specific Gravity, Urine 1.026 1.005 - 1.030   pH 6.5 5.0 - 8.0   Glucose, UA NEGATIVE NEGATIVE mg/dL   Hgb urine dipstick NEGATIVE NEGATIVE   Bilirubin Urine  NEGATIVE NEGATIVE   Ketones, ur NEGATIVE NEGATIVE mg/dL   Protein, ur NEGATIVE NEGATIVE mg/dL   Urobilinogen, UA 0.2 0.0 - 1.0 mg/dL   Nitrite NEGATIVE NEGATIVE   Leukocytes, UA SMALL (A) NEGATIVE  Urine microscopic-add on  Result Value Ref Range   Squamous Epithelial / LPF MANY (A) RARE   WBC, UA 11-20 <3 WBC/hpf   Bacteria, UA MANY (A) RARE  POC Urine Pregnancy, ED (if pre-menopausal female) - do NOT order at [facility]  Result Value Ref Range   Preg Test, Ur NEGATIVE NEGATIVE   No results found.   Imaging Review No results found.   EKG Interpretation None      MDM   Final diagnoses:  Dyspareunia  Contusion    Patient with small rash on right wrist, also complains of  some vaginal pain. Pelvic exam is largely unremarkable. There is some thin white discharge. I'm not very suspicious of infection. Patient states she recently had an STD screen which was negative.  She states that she has pain with intercourse, but she believes this is due to the roughness of her course. She denies any fevers, chills, nausea, or vomiting. I suspect that her pain is likely due to rough intercourse. Wet prep is pending. GC chlamydia pending. Will recommend OB/GYN follow-up. Patient understands and agrees with plan. She is stable and ready for discharge.  Wet prep unremarkable. UA is contaminated. No dysuria. No antibiotics at this time.  I personally performed the services described in this documentation, which was scribed in my presence. The recorded information has been reviewed and is accurate.    Montine Circle, PA-C 10/03/14 1603  Wandra Arthurs, MD 10/03/14 [phone]

## 2014-10-03 NOTE — Discharge Instructions (Signed)
Dyspareunia Dyspareunia is pain during sexual intercourse. It is most common in women, but it also happens in men.  CAUSES  Female The pain from this condition is usually felt when anything is put into the vagina, but any part of the genitals may cause pain during sex. Even sitting or wearing pants can cause pain. Sometimes, a cause cannot be found. Some causes of pain during intercourse are:  Infections of the skin around the vagina.  Vaginal infections, such as a yeast, bacterial, or viral infection.  Vaginismus. This is the inability to have anything put in the vagina even when the woman wants it to happen. There is an automatic muscle contraction and pain. The pain of the muscle contraction can be so severe that intercourse is impossible.  Allergic reaction from spermicides, semen, condoms, scented tampons, soaps, douches, and vaginal sprays.  A fluid-filled sac (cyst) on the Bartholin or Skene glands, located at the opening of the vagina.  Scar tissue in the vagina from a surgically enlarged opening (episiotomy) or tearing after delivering a baby.  Vaginal dryness. This is more common in menopause. The normal secretions of the vagina are decreased. Changes in estrogen levels and increased difficulty becoming aroused can cause painful sex. Vaginal dryness can also happen when taking birth control pills.  Thinning of the tissue (atrophy) of the vulva and vagina. This makes the area thinner, smaller, unable to stretch to accommodate a penis, and prone to infection and tearing.  Vulvar vestibulitis or vestibulodynia.This is a condition that causes pain involving the area around the entrance to the vagina.The most common cause in young women is birth control pills.Women with low estrogen levels (postmenopausal women) may also experience this.Other causes include allergic reactions, too many nerve endings, skin conditions, and pelvic muscles that cannot relax.  Vulvar dermatoses. This  includes skin conditions such as lichen sclerosus and lichen planus.  Lack of foreplay to lubricate the vagina. This can cause vaginal dryness.  Noncancerous tumors (fibroids) in the uterus.  Uterus lining tissue growing outside the uterus (endometriosis).  Pregnancy that starts in the fallopian tube (tubal pregnancy).  Pregnancy or breastfeeding your baby. This can cause vaginal dryness.  A tilting or prolapse of the uterus. Prolapse is when weak and stretched muscles around the uterus allow it to fall into the vagina.  Problems with the ovaries, cysts, or scar tissue. This may be worse with certain sexual positions.  Previous surgeries causing adhesions or scar tissue in the vagina or pelvis.  Bladder and intestinal problems.  Psychological problems (such as depression or anxiety). This may make pain worse.  Negative attitudes about sex, experiencing rape, sexual assault, and misinformation about sex. These issues are often related to some types of pain.  Previous pelvic infection, causing scar tissue in the pelvis and on the female organs.  Cyst or tumor on the ovary.  Cancer of the female organs.  Certain medicines.  Medical problems such as diabetes, arthritis, or thyroid disease. Female In men, there are many physical causes of sexual discomfort. Some causes of pain during intercourse are:  Infections of the prostate, bladder, or seminal vesicles. This can cause pain after ejaculation.  An inflamed bladder (interstitial cystitis). This may cause pain from ejaculation.  Gonorrheal infections. This may cause pain during ejaculation.  An inflamed urethra (urethritis) or inflamed prostate (prostatitis). This can make genital stimulation painful or uncomfortable.  Deformities of the penis, such as Peyronie's disease.  A tight foreskin.  Cancer of the female organs.  Psychological problems. This may make pain worse. DIAGNOSIS   Your caregiver will take a history and  have you describe where the pain is located (outside the vagina, in the vagina, in the pelvis). You may be asked when you experience pain, such as with penetration or with thrusting.  Following this, your caregiver will do a physical exam. Let your caregiver know if the exam is too painful.  During the final part of the female exam, your caregiver will feel your uterus and ovaries with one hand on the abdomen and one finger in your vagina. This is a pelvic exam.  Blood tests, a Pap test, cultures for infection, an ultrasound test, and X-rays may be done. You may need to see a specialist for female problems (gynecologist).  Your caregiver may do a CT scan, MRI, or laparoscopy. Laparoscopy is a procedure to look into the pelvis with a lighted tube, through a cut (incision) in the abdomen. TREATMENT  Your caregiver can help you determine the best course of treatment. Sometimes, more testing is done. Continue with the suggested testing until your caregiver feels sure about your diagnosis and how to treat it. Sometimes, it is difficult to find the reason for the pain. The search for the cause and treatment can be frustrating. Treatment often takes several weeks to a few months before you notice any improvement. You may also need to avoid sexual activity until symptoms improve.Continuing to have sex when it hurts can delay healing and actually make the problem worse. The treatment depends on the cause of the pain. Treatment may include:  Medicines such as antibiotics, vaginal or skin creams, hormones, or antidepressants.  Minor or major surgery.  Psychological counseling or group therapy.  Kegel exercises and vaginal dilators to help certain cases of vaginismus (spasms). Do this only if recommended by your caregiver.Kegel exercises can make some problems worse.  Applying lubrication as recommended by your caregiver if you have dryness.  Sex therapy for you and your sex partner. It is common for  the pain to continue after the reason for the pain has been treated. Some reasons for this include a conditioned response. This means the person having the pain becomes so familiar with the pain that the pain continues as a response, even though the cause is removed. Sex therapy can help with this problem. HOME CARE INSTRUCTIONS   Follow your caregiver's instructions about taking medicines, tests, counseling, and follow-up treatment.  Do not use scented tampons, douches, vaginal sprays, or soaps.  Use water-based lubricants for dryness. Oil lubricants can cause irritation.  Do not use spermicides or condoms that irritate you.  Openly discuss with your partner your sexual experience, your desires, foreplay, and different sexual positions for a more comfortable and enjoyable sexual relationship.  Join group sessions for therapy, if needed.  Practice safe sex at all times.  Empty your bladder before having intercourse.  Try different positions during sexual intercourse.  Take over-the-counter pain medicine recommended by your caregiver before having sexual intercourse.  Do not wear pantyhose. Knee-high and thigh-high hose are okay.  Avoid scrubbing your vulva with a washcloth. Wash the area gently and pat dry with a towel. SEEK MEDICAL CARE IF:   You develop vaginal bleeding after sexual intercourse.  You develop a lump at the opening of your vagina, even if it is not painful.  You have abnormal vaginal discharge.  You have vaginal dryness.  You have itching or irritation of the vulva or vagina.  You  develop a rash or reaction to your medicine. SEEK IMMEDIATE MEDICAL CARE IF:   You develop severe abdominal pain during or shortly after sexual intercourse. You could have a ruptured ovarian cyst or ruptured tubal pregnancy.  You have a fever.  You have painful or bloody urination.  You have painful sexual intercourse, and you never had it before.  You pass out after having  sexual intercourse. Document Released: 06/22/2007 Document Revised: 08/25/2011 Document Reviewed: 09/02/2010 [facility] Patient Information 2015 [place], [place]. This information is not intended to replace advice given to you by your health care provider. Make sure you discuss any questions you have with your health care provider.  Contusion A contusion is a deep bruise. Contusions are the result of an injury that caused bleeding under the skin. The contusion may turn blue, purple, or yellow. Minor injuries will give you a painless contusion, but more severe contusions may stay painful and swollen for a few weeks.  CAUSES  A contusion is usually caused by a blow, trauma, or direct force to an area of the body. SYMPTOMS   Swelling and redness of the injured area.  Bruising of the injured area.  Tenderness and soreness of the injured area.  Pain. DIAGNOSIS  The diagnosis can be made by taking a history and physical exam. An X-ray, CT scan, or MRI may be needed to determine if there were any associated injuries, such as fractures. TREATMENT  Specific treatment will depend on what area of the body was injured. In general, the best treatment for a contusion is resting, icing, elevating, and applying cold compresses to the injured area. Over-the-counter medicines may also be recommended for pain control. Ask your caregiver what the best treatment is for your contusion. HOME CARE INSTRUCTIONS   Put ice on the injured area.  Put ice in a plastic bag.  Place a towel between your skin and the bag.  Leave the ice on for 15-20 minutes, 3-4 times a day, or as directed by your health care provider.  Only take over-the-counter or prescription medicines for pain, discomfort, or fever as directed by your caregiver. Your caregiver may recommend avoiding anti-inflammatory medicines (aspirin, ibuprofen, and naproxen) for 48 hours because these medicines may increase bruising.  Rest the injured area.  If  possible, elevate the injured area to reduce swelling. SEEK IMMEDIATE MEDICAL CARE IF:   You have increased bruising or swelling.  You have pain that is getting worse.  Your swelling or pain is not relieved with medicines. MAKE SURE YOU:   Understand these instructions.  Will watch your condition.  Will get help right away if you are not doing well or get worse. Document Released: 03/12/2005 Document Revised: 06/07/2013 Document Reviewed: 04/07/2011 [facility] Patient Information 2015 [place], [place]. This information is not intended to replace advice given to you by your health care provider. Make sure you discuss any questions you have with your health care provider.

## 2014-10-03 NOTE — ED Notes (Signed)
Pt sates that she has had vaginal discharge and pelvic pain for several days. Pt states that she has had intermittent vaginal discharge. Pt states that she also has right wrist pain and reddness/swelling with no known injury.

## 2014-10-03 NOTE — ED Notes (Addendum)
Pt reports she just had pap smear and STD in 2015, pt just called this week for results, all results negative.  Pt has had brown colored discharge and scant amount of white vaginal discharge and suprapubic pain since she was 24 years old.  Reports bleeding when has intercourse.  Pt here to make sure everything all right.

## 2014-10-04 LAB — GC/CHLAMYDIA PROBE AMP (~~LOC~~) NOT AT ARMC
CHLAMYDIA, DNA PROBE: NEGATIVE
Neisseria Gonorrhea: NEGATIVE

## 2014-10-17 ENCOUNTER — Telehealth (HOSPITAL_BASED_OUTPATIENT_CLINIC_OR_DEPARTMENT_OTHER): Payer: Self-pay | Admitting: Emergency Medicine

## 2014-11-20 ENCOUNTER — Encounter: Payer: Self-pay | Admitting: Advanced Practice Midwife

## 2015-01-02 ENCOUNTER — Encounter (HOSPITAL_COMMUNITY): Payer: Self-pay | Admitting: Neurology

## 2015-01-02 ENCOUNTER — Emergency Department (HOSPITAL_COMMUNITY)
Admission: EM | Admit: 2015-01-02 | Discharge: 2015-01-02 | Disposition: A | Discharge status: Home / Self Care | Payer: Self-pay | Attending: Emergency Medicine | Admitting: Emergency Medicine

## 2015-01-02 DIAGNOSIS — L309 Dermatitis, unspecified: Secondary | ICD-10-CM | POA: Insufficient documentation

## 2015-01-02 DIAGNOSIS — I10 Essential (primary) hypertension: Secondary | ICD-10-CM | POA: Insufficient documentation

## 2015-01-02 DIAGNOSIS — Z3202 Encounter for pregnancy test, result negative: Secondary | ICD-10-CM | POA: Insufficient documentation

## 2015-01-02 DIAGNOSIS — F419 Anxiety disorder, unspecified: Secondary | ICD-10-CM | POA: Insufficient documentation

## 2015-01-02 DIAGNOSIS — Z792 Long term (current) use of antibiotics: Secondary | ICD-10-CM | POA: Insufficient documentation

## 2015-01-02 DIAGNOSIS — Z7982 Long term (current) use of aspirin: Secondary | ICD-10-CM | POA: Insufficient documentation

## 2015-01-02 DIAGNOSIS — Z8619 Personal history of other infectious and parasitic diseases: Secondary | ICD-10-CM | POA: Insufficient documentation

## 2015-01-02 DIAGNOSIS — N926 Irregular menstruation, unspecified: Secondary | ICD-10-CM | POA: Insufficient documentation

## 2015-01-02 DIAGNOSIS — Z79899 Other long term (current) drug therapy: Secondary | ICD-10-CM | POA: Insufficient documentation

## 2015-01-02 LAB — URINALYSIS, ROUTINE W REFLEX MICROSCOPIC
Bilirubin Urine: NEGATIVE
Glucose, UA: NEGATIVE mg/dL
Ketones, ur: 15 mg/dL — AB
Leukocytes, UA: NEGATIVE
Nitrite: NEGATIVE
Protein, ur: NEGATIVE mg/dL
SPECIFIC GRAVITY, URINE: 1.031 — AB (ref 1.005–1.030)
Urobilinogen, UA: 0.2 mg/dL (ref 0.0–1.0)
pH: 6 (ref 5.0–8.0)

## 2015-01-02 LAB — POC URINE PREG, ED: Preg Test, Ur: NEGATIVE

## 2015-01-02 LAB — URINE MICROSCOPIC-ADD ON

## 2015-01-02 MED ORDER — PREDNISONE 20 MG PO TABS: ORAL_TABLET | ORAL | Status: DC

## 2015-01-02 NOTE — ED Notes (Signed)
Pt has rash to front of neck for 2 months and is itchy. Has blister to back of left heel, popped today and is painful. Reports has had migraine but not now. Thinks her right eye is twitching. Hasn't had period in 4 months but is irrregular, with stabbing pain. Reports vaginal "blood strings"

## 2015-01-02 NOTE — Discharge Instructions (Signed)
Eczema Eczema, also called atopic dermatitis, is a skin disorder that causes inflammation of the skin. It causes a red rash and dry, scaly skin. The skin becomes very itchy. Eczema is generally worse during the cooler winter months and often improves with the warmth of summer. Eczema usually starts showing signs in infancy. Some children outgrow eczema, but it may last through adulthood.  CAUSES  The exact cause of eczema is not known, but it appears to run in families. People with eczema often have a family history of eczema, allergies, asthma, or hay fever. Eczema is not contagious. Flare-ups of the condition may be caused by:   Contact with something you are sensitive or allergic to.   Stress. SIGNS AND SYMPTOMS  Dry, scaly skin.   Red, itchy rash.   Itchiness. This may occur before the skin rash and may be very intense.  DIAGNOSIS  The diagnosis of eczema is usually made based on symptoms and medical history. TREATMENT  Eczema cannot be cured, but symptoms usually can be controlled with treatment and other strategies. A treatment plan might include:  Controlling the itching and scratching.   Use over-the-counter antihistamines as directed for itching. This is especially useful at night when the itching tends to be worse.   Use over-the-counter steroid creams as directed for itching.   Avoid scratching. Scratching makes the rash and itching worse. It may also result in a skin infection (impetigo) due to a break in the skin caused by scratching.   Keeping the skin well moisturized with creams every day. This will seal in moisture and help prevent dryness. Lotions that contain alcohol and water should be avoided because they can dry the skin.   Limiting exposure to things that you are sensitive or allergic to (allergens).   Recognizing situations that cause stress.   Developing a plan to manage stress.  HOME CARE INSTRUCTIONS   Only take over-the-counter or  prescription medicines as directed by your health care provider.   Do not use anything on the skin without checking with your health care provider.   Keep baths or showers short (5 minutes) in warm (not hot) water. Use mild cleansers for bathing. These should be unscented. You may add nonperfumed bath oil to the bath water. It is best to avoid soap and bubble bath.   Immediately after a bath or shower, when the skin is still damp, apply a moisturizing ointment to the entire body. This ointment should be a petroleum ointment. This will seal in moisture and help prevent dryness. The thicker the ointment, the better. These should be unscented.   Keep fingernails cut short. Children with eczema may need to wear soft gloves or mittens at night after applying an ointment.   Dress in clothes made of cotton or cotton blends. Dress lightly, because heat increases itching.   A child with eczema should stay away from anyone with fever blisters or cold sores. The virus that causes fever blisters (herpes simplex) can cause a serious skin infection in children with eczema. SEEK MEDICAL CARE IF:   Your itching interferes with sleep.   Your rash gets worse or is not better within 1 week after starting treatment.   You see pus or soft yellow scabs in the rash area.   You have a fever.   You have a rash flare-up after contact with someone who has fever blisters.  Document Released: 05/30/2000 Document Revised: 03/23/2013 Document Reviewed: 01/03/2013 [facility] Patient Information 2015 [place], [place]. This information  is not intended to replace advice given to you by your health care provider. Make sure you discuss any questions you have with your health care provider.   Emergency Department Resource Guide 1) Find a Doctor and Pay Out of Pocket Although you won't have to find out who is covered by your insurance plan, it is a good idea to ask around and get recommendations. You will then need  to call the office and see if the doctor you have chosen will accept you as a new patient and what types of options they offer for patients who are self-pay. Some doctors offer discounts or will set up payment plans for their patients who do not have insurance, but you will need to ask so you aren't surprised when you get to your appointment.  2) Contact Your Local Health Department Not all health departments have doctors that can see patients for sick visits, but many do, so it is worth a call to see if yours does. If you don't know where your local health department is, you can check in your phone book. The CDC also has a tool to help you locate your state's health department, and many state websites also have listings of all of their local health departments.  3) Find a [facility] If your illness is not likely to be very severe or complicated, you may want to try a walk in clinic. These are popping up all over the country in pharmacies, drugstores, and shopping centers. They're usually staffed by nurse practitioners or physician assistants that have been trained to treat common illnesses and complaints. They're usually fairly quick and inexpensive. However, if you have serious medical issues or chronic medical problems, these are probably not your best option.  No Primary Care Doctor: - Call Health Connect at  [phone] - they can help you locate a primary care doctor that  accepts your insurance, provides certain services, etc. - Physician Referral Service- [phone]  Chronic Pain Problems: Organization         Address  Phone   Notes  [facility]  [phone] Patients need to be referred by their primary care doctor.   Medication Assistance: Organization         Address  Phone   Notes  [facility] Medication [facility] [place] [place], [place] [address] [phone] --Must be a resident of [facility] -- Must have NO insurance  coverage whatsoever (no Medicaid/ Medicare, etc.) -- The pt. MUST have a primary care doctor that directs their care regularly and follows them in the community   MedAssist  [phone]   [employer]  [phone]    Agencies that provide inexpensive medical care: Organization         Address  Phone   Notes  [place]  [phone]   [facility]    [phone]   [facility] [place], [place] [address] [phone]   [place] [address] [place] [phone]   Planned Parenthood    [phone]   [facility]    [phone]   [place] and [address], [place] Phone:  [phone], Fax:  [phone] Hours of Operation:  9 am - 6 pm, M-F.  Also accepts Medicaid/Medicare and self-pay.  [facility] for [address] [place],  [address], [place] Phone: [phone], Fax: [phone]. Hours of Operation:  8:30 am - 5:30 pm, M-F.  Also accepts Medicaid and self-pay.  [facility] [place] [address] [place] Phone: [phone]   [place], [place], [place] [phone], Ext. [phone] Mondays & Thursdays: 7-9 AM.  First 15 patients are seen on a first come, first serve basis.    [place] Providers:  Organization         Address  Phone   Notes  [facility] [address], [place] [phone] Also accepts self-pay patients.  [facility] [address], [place]  [phone]   [place], [address], [place] [phone]   [facility] [address] [place] [phone]   Lucianne Lei [address], [place]   [phone] Only accepts [place] Access [place] patients after they have their  name applied to their card.   Self-Pay (no insurance) in [facility]:  Organization         Address  Phone   Notes  Sickle Cell Patients, [facility] [place] [phone]   [facility] [place] [phone]   [facility] [place]  [place], [place], [place] [phone]   [facility]/Dr. Osei-Bonsu  [address], [place] or [address], [place] [phone] Phone number for both [place] and [place] locations is the same.  Urgent Medical and [facility] [address], [place] [phone]   [facility] [address] [place] or [address] [phone]   [facility] [address] [place] [phone], phone; [phone], fax Sees patients 1st and 3rd Saturday of every month.  Must not qualify for public or private insurance (i.e. Medicaid, Medicare, [place] Health Choice, Veterans' Benefits)  Household income should be no more than 200% of the poverty level The clinic cannot treat you if you are pregnant or think you are pregnant  Sexually transmitted diseases are not treated at the clinic.    Dental Care: Organization         Address  Phone  Notes  [facility] Department of [facility] [place] [phone] Accepts children up to age 20 who are enrolled in [place] or [place]; pregnant women with a Medicaid card; and children who have applied for Medicaid or [place] Health Choice, but were declined, whose parents can pay a reduced fee at time of service.  [facility] Department of [facility]  [address], [place] [phone] Accepts children up to age 53 who are enrolled in [place] or [place]; pregnant women with a Medicaid card; and children who have applied for  Medicaid or [place] Health Choice, but were declined, whose parents can pay a reduced fee at time of service.  [facility] Adult Dental Access PROGRAM  [place] [phone] Patients are seen by appointment only. Walk-ins are not accepted. [place] will see patients 3 years of age and older. Monday - Tuesday (8am-5pm) Most Wednesdays (8:30-5pm) $30 per visit, cash only  [place]  [address], [place] [phone] Patients are seen by appointment only. Walk-ins are not accepted. [place] will see patients 31 years of age and older. One Wednesday Evening (Monthly: Volunteer Based).  $30 per visit, cash only  [place]  [phone] for adults; Children under age 97, call Graduate Pediatric Dentistry at [phone]. Children aged 102-14, please call [phone] to request a pediatric application.  Dental services are provided in all areas of dental care including fillings, crowns and bridges, complete and partial dentures, implants, gum treatment, root canals, and extractions. Preventive care is also provided. Treatment is provided to both adults and children. Patients are selected via a lottery and there is often a waiting list.   [facility] [address], [place]  [phone] [email or web address]   [facility] [address] [place], [place] [phone], Ext. [phone] Second and Fourth Thursday of each month, opens at 6:30 AM; Clinic ends at 9 AM.  Patients are seen on a first-come first-served basis, and a limited number are seen during each clinic.   [facility]  [address] Hillard Danker [place], [place] [phone]   Eligibility Requirements You must have lived in [place], [place], or [place] counties for at least the last three months.   You cannot be eligible for state or federal sponsored [employer], including [employer], [place],  or [employer].   You generally cannot be eligible for healthcare insurance through your employer.    How to apply: Eligibility screenings are held every Tuesday and Wednesday afternoon from 1:00 pm until 4:00 pm. You do not need an appointment for the interview!  [facility] [address], [place], [place]   [place]  [place] Department  [place]  [phone]    Behavioral Health Resources in the Community: Intensive Outpatient Programs Organization         Address  Phone  Notes  [place]. [address], [place], [place] [phone]   [facility] [address], [place], [place]   ADS: Alcohol & Drug Svcs [address], [place], [place]   [place] [address]  [place], [place] or [phone]   Substance Abuse Resources Organization         Address  Phone  Notes  Alcohol and Drug Services  [phone]   [place]  [phone]   [facility] [place]   Chinita Pester  [phone]   Residential & Outpatient Substance Abuse Program  [phone]   Psychological Services Organization         Address  Phone  Notes  [facility] [place]  [place]  [phone]   [place] [address], [place] or [phone]    Mobile Crisis Teams Organization         Address  Phone  Notes  [facility], Mobile Crisis Care Unit  [phone]   [facility] Psychotherapeutic Services  [address] [place], [place]   [place] [address], [place] [phone]    Self-Help/Support Groups Organization         Address  Phone             Notes  [place]. of [place] -  variety of support groups   [phone] Call for more information  Narcotics Anonymous (NA), [facility] [address], [employer]   2 meetings at this location   Residential Facilities manager         Address  Phone  Notes  [facility] [place],    [place]  [phone]   [facility]  [address] [place] [phone], [place], [place]   [place], [place] [phone] Admissions: 8am-3pm M-F  Incentives Substance [place] [address],    [place], [place] [phone]   [facility] [address] [place], [place], [place]   [facility] [address]  [place], [place]   [facility] Programs - Intensive Outpatient [address]een [address], [place], [place]   [facility] [place].,  [place], [place] [phone] or [phone]   [facility]idential Treatment Services (RTS) [address] [place], [place] Accepts Medicaid  [facility] [place] [address]  [place] [phone] Substance Abuse/Addiction Treatment   [facility] Organization         Address  Phone  Notes  [facility]  [phone]   Domenic Schwab, PhD [address] Arlis Porta [place], [place]   [phone] or [phone]   [place], [place] [phone]   [facility] [address], [place], [place] [phone] Insurance/Medicaid/sponsorship through [facility] and Families [address] [place]                                    [place], [place] [phone] [place] [address], [place] [phone]    Dr. Adele Schilder  [phone]   [facility] Dept. 1) [address], [place] 2) [place] 3)  [place] [address], [place] [phone]  [phone]   [place] [phone] or [phone] (After Hours)

## 2015-01-02 NOTE — ED Provider Notes (Signed)
CSN: [id number]     Arrival date & time 01/02/15  1549 History  This chart was scribed for Kathleen Moras, PA-C, working with Kathleen Patches, MD by Kathleen [name], ED Scribe. This patient was seen in room TR03C/TR03C and the patient's care was started at 5:56 PM.    Chief Complaint  Patient presents with  . Blister  . Rash  . Menstrual Problem     The history is provided by the patient. No language interpreter was used.   HPI Comments: Kathleen [name] is a 24 y.o. female who presents to the Emergency Department complaining of multiple complaints. Pt notes having an itchy neck rash that occurs every summer started 3 weeks ago. Started small and radiated to her upper neck and bilateral anterior forearms. She notes feeling knots under some of the rash. Pt states it spreads when she gets to the sun. She has not put any topical cream on her rash to help alleviate the itching. She denies fever, birth control, and STD's. Rash has been recurrent each summer.  Brother has hx of eczema.  She also c/o blisters to the back of both heels ongoing for more than a week, mildly tender.  No changes in shoe and no trauma.  Has notice drainage from it.  Report having irregular period with occasional sharp stabbing abd pain.  No actual pain at this time.  Currently on her menstrual period.  Report having recurrent migraine headache.  Does not have PCP.    Past Medical History  Diagnosis Date  . Depression   . Bipolar 1 disorder   . Headache(784.0)   . STD (female)   . Hypertension   . Schizophrenia    Past Surgical History  Procedure Laterality Date  . Bunionectomy      both feet   Family History  Problem Relation Age of Onset  . Diabetes Mother   . Hypertension Mother    History  Substance Use Topics  . Smoking status: Never Smoker   . Smokeless tobacco: Not on file  . Alcohol Use: No   OB History    No data available     Review of Systems  Constitutional: Negative for fever.  Skin:  Positive for rash.      Allergies  Other  Home Medications   Prior to Admission medications   Medication Sig Start Date End Date Taking? Authorizing Provider  aspirin 81 MG tablet Take 162 mg by mouth once.    Historical Provider, MD  cephALEXin (KEFLEX) 500 MG capsule Take 1 capsule (500 mg total) by mouth 4 (four) times daily. 08/07/14   Kathleen Szekalski, PA-C  fluconazole (DIFLUCAN) 150 MG tablet Take 1 tablet (150 mg total) by mouth once. Patient not taking: Reported on 06/23/2014 04/26/14   Kathleen Sane, PA-C  gabapentin (NEURONTIN) 300 MG capsule Take 300 mg by mouth 3 (three) times daily.    Historical Provider, MD  gabapentin (NEURONTIN) 300 MG capsule Take 1 capsule (300 mg total) by mouth daily. Patient not taking: Reported on 06/23/2014 04/26/14   Kathleen Sane, PA-C  guaiFENesin-codeine 100-10 MG/5ML syrup Take 5-10 mLs by mouth every 6 (six) hours as needed for cough. 06/23/14   Kathleen Mail, PA-C  naproxen (NAPROSYN) 500 MG tablet Take 1 tablet (500 mg total) by mouth 2 (two) times daily with a meal. Patient not taking: Reported on 06/23/2014 01/16/14   Kathleen Flemings, MD  naproxen (NAPROSYN) 500 MG tablet Take 1 tablet (500 mg total) by mouth 2 (two)  times daily with a meal. Patient not taking: Reported on 06/23/2014 04/26/14   Kathleen Sane, PA-C  traMADol (ULTRAM) 50 MG tablet Take 1 tablet (50 mg total) by mouth every 6 (six) hours as needed. 08/07/14   Kathleen Chou, PA-C   Triage vitals: BP 120/77 mmHg  Pulse 90  Temp(Src) 98.3 F (36.8 C) (Oral)  Resp 18  SpO2 98% Physical Exam  Constitutional: She appears well-developed and well-nourished. No distress.  HENT:  Head: Normocephalic and atraumatic.  Eyes: Conjunctivae are normal. Pupils are equal, round, and reactive to light. Right eye exhibits no discharge. Left eye exhibits no discharge.  Neck: Neck supple.  Cardiovascular: Normal rate, regular rhythm, normal heart sounds and intact distal  pulses.   Pulmonary/Chest: Effort normal and breath sounds normal. No respiratory distress.  Abdominal: Soft. There is no tenderness.  Lymphadenopathy:    She has no cervical adenopathy.  Neurological: She is alert. Coordination normal.  Skin: Skin is warm and dry. No rash noted. She is not diaphoretic.  Ashy erythematous skin changes noted to anterior neck and bilateral forearms.  Psychiatric: She has a normal mood and affect. Her behavior is normal.  Nursing note and vitals reviewed.   ED Course  Procedures  DIAGNOSTIC STUDIES: Oxygen Saturation is 98% on RA, normal by my interpretation.  COORDINATION OF CARE:  5:59 PM Discussed treatment plan which includes steroid cream and referral to a PCP with pt at bedside and pt agreed to plan.  Recommend f/u with a pcp for further management of her many other non life threatening complaints.    Labs Review Labs Reviewed  URINALYSIS, ROUTINE W REFLEX MICROSCOPIC (NOT AT [facility]) - Abnormal; Notable for the following:    Color, Urine Kathleen [name] (*)    APPearance CLOUDY (*)    Specific Gravity, Urine 1.031 (*)    Hgb urine dipstick LARGE (*)    Ketones, ur 15 (*)    All other components within normal limits  URINE MICROSCOPIC-ADD ON - Abnormal; Notable for the following:    Squamous Epithelial / LPF MANY (*)    All other components within normal limits  POC URINE PREG, ED    Imaging Review No results found.   EKG Interpretation None      MDM   Final diagnoses:  Eczema    BP 120/77 mmHg  Pulse 90  Temp(Src) 98.3 F (36.8 C) (Oral)  Resp 18  SpO2 98%  I personally performed the services described in this documentation, which was scribed in my presence. The recorded information has been reviewed and is accurate.    Kathleen Moras, PA-C 01/02/15 [place], MD 01/03/15 [phone]

## 2015-01-07 ENCOUNTER — Encounter (HOSPITAL_COMMUNITY): Payer: Self-pay | Admitting: Emergency Medicine

## 2015-01-07 ENCOUNTER — Emergency Department (HOSPITAL_COMMUNITY): Payer: Self-pay

## 2015-01-07 ENCOUNTER — Emergency Department (HOSPITAL_COMMUNITY)
Admission: EM | Admit: 2015-01-07 | Discharge: 2015-01-07 | Disposition: A | Discharge status: Home / Self Care | Payer: Self-pay | Attending: Emergency Medicine | Admitting: Emergency Medicine

## 2015-01-07 DIAGNOSIS — Z792 Long term (current) use of antibiotics: Secondary | ICD-10-CM | POA: Insufficient documentation

## 2015-01-07 DIAGNOSIS — I10 Essential (primary) hypertension: Secondary | ICD-10-CM | POA: Insufficient documentation

## 2015-01-07 DIAGNOSIS — Z79899 Other long term (current) drug therapy: Secondary | ICD-10-CM | POA: Insufficient documentation

## 2015-01-07 DIAGNOSIS — Z8619 Personal history of other infectious and parasitic diseases: Secondary | ICD-10-CM | POA: Insufficient documentation

## 2015-01-07 DIAGNOSIS — Z7982 Long term (current) use of aspirin: Secondary | ICD-10-CM | POA: Insufficient documentation

## 2015-01-07 DIAGNOSIS — N939 Abnormal uterine and vaginal bleeding, unspecified: Secondary | ICD-10-CM | POA: Insufficient documentation

## 2015-01-07 DIAGNOSIS — F329 Major depressive disorder, single episode, unspecified: Secondary | ICD-10-CM | POA: Insufficient documentation

## 2015-01-07 DIAGNOSIS — R102 Pelvic and perineal pain: Secondary | ICD-10-CM | POA: Insufficient documentation

## 2015-01-07 DIAGNOSIS — R350 Frequency of micturition: Secondary | ICD-10-CM | POA: Insufficient documentation

## 2015-01-07 DIAGNOSIS — N898 Other specified noninflammatory disorders of vagina: Secondary | ICD-10-CM | POA: Insufficient documentation

## 2015-01-07 DIAGNOSIS — Z3202 Encounter for pregnancy test, result negative: Secondary | ICD-10-CM | POA: Insufficient documentation

## 2015-01-07 LAB — URINALYSIS, ROUTINE W REFLEX MICROSCOPIC
Bilirubin Urine: NEGATIVE
GLUCOSE, UA: NEGATIVE mg/dL
KETONES UR: NEGATIVE mg/dL
NITRITE: NEGATIVE
Protein, ur: 30 mg/dL — AB
Specific Gravity, Urine: 1.024 (ref 1.005–1.030)
Urobilinogen, UA: 1 mg/dL (ref 0.0–1.0)
pH: 6 (ref 5.0–8.0)

## 2015-01-07 LAB — WET PREP, GENITAL
Clue Cells Wet Prep HPF POC: NONE SEEN
Trich, Wet Prep: NONE SEEN
WBC, Wet Prep HPF POC: NONE SEEN
Yeast Wet Prep HPF POC: NONE SEEN

## 2015-01-07 LAB — URINE MICROSCOPIC-ADD ON

## 2015-01-07 LAB — POC URINE PREG, ED: Preg Test, Ur: NEGATIVE

## 2015-01-07 MED ORDER — NAPROXEN 500 MG PO TABS: 500.0000 mg | ORAL_TABLET | Freq: Two times a day (BID) | ORAL | Status: DC
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
## 2015-01-07 NOTE — Discharge Instructions (Signed)
Continue naproxen for pain. Please follow-up with her OB/GYN doctor regarding your pelvic pain. Return if worsening symptoms.  Pelvic Pain Female pelvic pain can be caused by many different things and start from a variety of places. Pelvic pain refers to pain that is located in the lower half of the abdomen and between your hips. The pain may occur over a short period of time (acute) or may be reoccurring (chronic). The cause of pelvic pain may be related to disorders affecting the female reproductive organs (gynecologic), but it may also be related to the bladder, kidney stones, an intestinal complication, or muscle or skeletal problems. Getting help right away for pelvic pain is important, especially if there has been severe, sharp, or a sudden onset of unusual pain. It is also important to get help right away because some types of pelvic pain can be life threatening.  CAUSES  Below are only some of the causes of pelvic pain. The causes of pelvic pain can be in one of several categories.   Gynecologic.  Pelvic inflammatory disease.  Sexually transmitted infection.  Ovarian cyst or a twisted ovarian ligament (ovarian torsion).  Uterine lining that grows outside the uterus (endometriosis).  Fibroids, cysts, or tumors.  Ovulation.  Pregnancy.  Pregnancy that occurs outside the uterus (ectopic pregnancy).  Miscarriage.  Labor.  Abruption of the placenta or ruptured uterus.  Infection.  Uterine infection (endometritis).  Bladder infection.  Diverticulitis.  Miscarriage related to a uterine infection (septic abortion).  Bladder.  Inflammation of the bladder (cystitis).  Kidney stone(s).  Gastrointestinal.  Constipation.  Diverticulitis.  Neurologic.  Trauma.  Feeling pelvic pain because of mental or emotional causes (psychosomatic).  Cancers of the bowel or pelvis. EVALUATION  Your caregiver will want to take a careful history of your concerns. This includes  recent changes in your health, a careful gynecologic history of your periods (menses), and a sexual history. Obtaining your family history and medical history is also important. Your caregiver may suggest a pelvic exam. A pelvic exam will help identify the location and severity of the pain. It also helps in the evaluation of which organ system may be involved. In order to identify the cause of the pelvic pain and be properly treated, your caregiver may order tests. These tests may include:   A pregnancy test.  Pelvic ultrasonography.  An X-ray exam of the abdomen.  A urinalysis or evaluation of vaginal discharge.  Blood tests. HOME CARE INSTRUCTIONS   Only take over-the-counter or prescription medicines for pain, discomfort, or fever as directed by your caregiver.   Rest as directed by your caregiver.   Eat a balanced diet.   Drink enough fluids to make your urine clear or pale yellow, or as directed.   Avoid sexual intercourse if it causes pain.   Apply warm or cold compresses to the lower abdomen depending on which one helps the pain.   Avoid stressful situations.   Keep a journal of your pelvic pain. Write down when it started, where the pain is located, and if there are things that seem to be associated with the pain, such as food or your menstrual cycle.  Follow up with your caregiver as directed.  SEEK MEDICAL CARE IF:  Your medicine does not help your pain.  You have abnormal vaginal discharge. SEEK IMMEDIATE MEDICAL CARE IF:   You have heavy bleeding from the vagina.   Your pelvic pain increases.   You feel light-headed or faint.   You have  chills.   You have pain with urination or blood in your urine.   You have uncontrolled diarrhea or vomiting.   You have a fever or persistent symptoms for more than 3 days.  You have a fever and your symptoms suddenly get worse.   You are being physically or sexually abused.  MAKE SURE  YOU:  Understand these instructions.  Will watch your condition.  Will get help if you are not doing well or get worse. Document Released: 04/29/2004 Document Revised: 10/17/2013 Document Reviewed: 09/22/2011 [facility] Patient Information 2015 [place], [place]. This information is not intended to replace advice given to you by your health care provider. Make sure you discuss any questions you have with your health care provider.
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
## 2015-01-07 NOTE — ED Notes (Signed)
Pt sts lower abd pain into back with vaginal discharge; pt sts she had unprotected sex last week and wants to be checked

## 2015-01-07 NOTE — ED Notes (Signed)
Pt transported to ultrasound.

## 2015-01-08 LAB — GC/CHLAMYDIA PROBE AMP (~~LOC~~) NOT AT ARMC
Chlamydia: NEGATIVE
Neisseria Gonorrhea: NEGATIVE

## 2015-02-08 ENCOUNTER — Encounter (HOSPITAL_COMMUNITY): Payer: Self-pay | Admitting: Family Medicine

## 2015-02-08 ENCOUNTER — Emergency Department (HOSPITAL_COMMUNITY)
Admission: EM | Admit: 2015-02-08 | Discharge: 2015-02-08 | Disposition: A | Discharge status: Home / Self Care | Payer: Self-pay | Attending: Emergency Medicine | Admitting: Emergency Medicine

## 2015-02-08 DIAGNOSIS — L309 Dermatitis, unspecified: Secondary | ICD-10-CM | POA: Insufficient documentation

## 2015-02-08 DIAGNOSIS — F319 Bipolar disorder, unspecified: Secondary | ICD-10-CM | POA: Insufficient documentation

## 2015-02-08 DIAGNOSIS — Z3202 Encounter for pregnancy test, result negative: Secondary | ICD-10-CM | POA: Insufficient documentation

## 2015-02-08 DIAGNOSIS — N72 Inflammatory disease of cervix uteri: Secondary | ICD-10-CM | POA: Insufficient documentation

## 2015-02-08 DIAGNOSIS — Z8619 Personal history of other infectious and parasitic diseases: Secondary | ICD-10-CM | POA: Insufficient documentation

## 2015-02-08 DIAGNOSIS — I10 Essential (primary) hypertension: Secondary | ICD-10-CM | POA: Insufficient documentation

## 2015-02-08 LAB — URINALYSIS, ROUTINE W REFLEX MICROSCOPIC
Bilirubin Urine: NEGATIVE
Glucose, UA: NEGATIVE mg/dL
Hgb urine dipstick: NEGATIVE
Ketones, ur: NEGATIVE mg/dL
Nitrite: NEGATIVE
Protein, ur: NEGATIVE mg/dL
SPECIFIC GRAVITY, URINE: 1.025 (ref 1.005–1.030)
UROBILINOGEN UA: 0.2 mg/dL (ref 0.0–1.0)
pH: 5.5 (ref 5.0–8.0)

## 2015-02-08 LAB — URINE MICROSCOPIC-ADD ON

## 2015-02-08 LAB — POC URINE PREG, ED: PREG TEST UR: NEGATIVE

## 2015-02-08 LAB — WET PREP, GENITAL
CLUE CELLS WET PREP: NONE SEEN
TRICH WET PREP: NONE SEEN
Yeast Wet Prep HPF POC: NONE SEEN

## 2015-02-08 MED ORDER — AZITHROMYCIN 250 MG PO TABS
1000.0000 mg | ORAL_TABLET | Freq: Once | ORAL | Status: AC
  Administered 2015-02-08: 1000 mg via ORAL
  Filled 2015-02-08: qty 4

## 2015-02-08 MED ORDER — CEFTRIAXONE SODIUM 250 MG IJ SOLR
250.0000 mg | Freq: Once | INTRAMUSCULAR | Status: AC
  Administered 2015-02-08: 250 mg via INTRAMUSCULAR
  Filled 2015-02-08: qty 250

## 2015-02-08 MED ORDER — TRAMADOL HCL 50 MG PO TABS: 50.0000 mg | ORAL_TABLET | Freq: Four times a day (QID) | ORAL | Status: DC | PRN

## 2015-02-08 MED ORDER — TRIAMCINOLONE ACETONIDE 0.1 % EX CREA: 1.0000 "application " | TOPICAL_CREAM | Freq: Two times a day (BID) | CUTANEOUS | Status: DC

## 2015-02-08 MED ORDER — DOXYCYCLINE HYCLATE 100 MG PO CAPS
100.0000 mg | ORAL_CAPSULE | Freq: Two times a day (BID) | ORAL | Status: DC
Start: 2015-02-08 — End: 2015-04-20

## 2015-02-08 MED ORDER — LIDOCAINE HCL (PF) 1 % IJ SOLN
INTRAMUSCULAR | Status: AC
Start: 2015-02-08 — End: 2015-02-08
  Administered 2015-02-08: 5 mL
  Filled 2015-02-08: qty 5
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
## 2015-02-08 NOTE — Discharge Instructions (Signed)
Take doxycycline twice daily for 1 week. Take tramadol as directed as needed for pain. No sexual intercourse for 7 days after completing antibiotic. Keep your appointment with OB/GYN next week in follow-up. Apply triamcinolone cream twice daily to your rash.  Cervicitis Cervicitis is a soreness and swelling (inflammation) of the cervix. Your cervix is located at the bottom of your uterus. It opens up to the vagina. CAUSES   Sexually transmitted infections (STIs).   Allergic reaction.   Medicines or birth control devices that are put in the vagina.   Injury to the cervix.   Bacterial infections.  RISK FACTORS You are at greater risk if you:  Have unprotected sexual intercourse.  Have sexual intercourse with many partners.  Began sexual intercourse at an early age.  Have a history of STIs. SYMPTOMS  There may be no symptoms. If symptoms occur, they may include:   Gray, white, yellow, or bad-smelling vaginal discharge.   Pain or itching of the area outside the vagina.   Painful sexual intercourse.   Lower abdominal or lower back pain, especially during intercourse.   Frequent urination.   Abnormal vaginal bleeding between periods, after sexual intercourse, or after menopause.   Pressure or a heavy feeling in the pelvis.  DIAGNOSIS  Diagnosis is made after a pelvic exam. Other tests may include:   Examination of any discharge under a microscope (wet prep).   A Pap test.  TREATMENT  Treatment will depend on the cause of cervicitis. If it is caused by an STI, both you and your partner will need to be treated. Antibiotic medicines will be given.  HOME CARE INSTRUCTIONS   Do not have sexual intercourse until your health care provider says it is okay.   Do not have sexual intercourse until your partner has been treated, if your cervicitis is caused by an STI.   Take your antibiotics as directed. Finish them even if you start to feel better.  SEEK  MEDICAL CARE IF:  Your symptoms come back.   You have a fever.  MAKE SURE YOU:   Understand these instructions.  Will watch your condition.  Will get help right away if you are not doing well or get worse. Document Released: 06/02/2005 Document Revised: 06/07/2013 Document Reviewed: 11/24/2012 [facility] Patient Information 2015 [place], [place]. This information is not intended to replace advice given to you by your health care provider. Make sure you discuss any questions you have with your health care provider.

## 2015-02-08 NOTE — ED Notes (Signed)
Pt here for pain in abdomen, vaginal discharge that is brown and rash to chest. sts the pain is from her ovaries.

## 2015-02-09 LAB — GC/CHLAMYDIA PROBE AMP (~~LOC~~) NOT AT ARMC
Chlamydia: NEGATIVE
NEISSERIA GONORRHEA: NEGATIVE

## 2015-02-09 LAB — HIV ANTIBODY (ROUTINE TESTING W REFLEX): HIV SCREEN 4TH GENERATION: NONREACTIVE

## 2015-02-12 ENCOUNTER — Encounter: Payer: Self-pay | Admitting: Obstetrics & Gynecology

## 2015-02-12 ENCOUNTER — Ambulatory Visit (INDEPENDENT_AMBULATORY_CARE_PROVIDER_SITE_OTHER): Payer: Self-pay | Admitting: Obstetrics & Gynecology

## 2015-02-12 VITALS — BP 122/83 | HR 91 | Temp 98.6°F

## 2015-02-12 DIAGNOSIS — N915 Oligomenorrhea, unspecified: Secondary | ICD-10-CM

## 2015-02-12 DIAGNOSIS — R102 Pelvic and perineal pain: Secondary | ICD-10-CM

## 2015-02-12 DIAGNOSIS — E282 Polycystic ovarian syndrome: Secondary | ICD-10-CM

## 2015-02-12 LAB — TSH: TSH: 1.539 u[IU]/mL (ref 0.350–4.500)

## 2015-02-12 LAB — HEMOGLOBIN A1C
HEMOGLOBIN A1C: 6.2 % — AB (ref <5.7)
MEAN PLASMA GLUCOSE: 131 mg/dL — AB (ref <117)

## 2015-02-12 MED ORDER — NORGESTIMATE-ETH ESTRADIOL 0.25-35 MG-MCG PO TABS: 1.0000 | ORAL_TABLET | Freq: Every day | ORAL | Status: DC

## 2015-02-12 NOTE — Patient Instructions (Signed)
Polycystic Ovarian Syndrome Polycystic ovarian syndrome (PCOS) is a common hormonal disorder among women of reproductive age. Most women with PCOS grow many small cysts on their ovaries. PCOS can cause problems with your periods and make it difficult to get pregnant. It can also cause an increased risk of miscarriage with pregnancy. If left untreated, PCOS can lead to serious health problems, such as diabetes and heart disease. CAUSES The cause of PCOS is not fully understood, but genetics may be a factor. SIGNS AND SYMPTOMS   Infrequent or no menstrual periods.   Inability to get pregnant (infertility) because of not ovulating.   Increased growth of hair on the face, chest, stomach, back, thumbs, thighs, or toes.   Acne, oily skin, or dandruff.   Pelvic pain.   Weight gain or obesity, usually carrying extra weight around the waist.   Type 2 diabetes.   High cholesterol.   High blood pressure.   Female-pattern baldness or thinning hair.   Patches of thickened and dark brown or black skin on the neck, arms, breasts, or thighs.   Tiny excess flaps of skin (skin tags) in the armpits or neck area.   Excessive snoring and having breathing stop at times while asleep (sleep apnea).   Deepening of the voice.   Gestational diabetes when pregnant.  DIAGNOSIS  There is no single test to diagnose PCOS.   Your health care provider will:   Take a medical history.   Perform a pelvic exam.   Have ultrasonography done.   Check your female and female hormone levels.   Measure glucose or sugar levels in the blood.   Do other blood tests.   If you are producing too many female hormones, your health care provider will make sure it is from PCOS. At the physical exam, your health care provider will want to evaluate the areas of increased hair growth. Try to allow natural hair growth for a few days before the visit.   During a pelvic exam, the ovaries may be enlarged  or swollen because of the increased number of small cysts. This can be seen more easily by using vaginal ultrasonography or screening to examine the ovaries and lining of the uterus (endometrium) for cysts. The uterine lining may become thicker if you have not been having a regular period.  TREATMENT  Because there is no cure for PCOS, it needs to be managed to prevent problems. Treatments are based on your symptoms. Treatment is also based on whether you want to have a baby or whether you need contraception.  Treatment may include:   Progesterone hormone to start a menstrual period.   Birth control pills to make you have regular menstrual periods.   Medicines to make you ovulate, if you want to get pregnant.   Medicines to control your insulin.   Medicine to control your blood pressure.   Medicine and diet to control your high cholesterol and triglycerides in your blood.  Medicine to reduce excessive hair growth.  Surgery, making small holes in the ovary, to decrease the amount of female hormone production. This is done through a long, lighted tube (laparoscope) placed into the pelvis through a tiny incision in the lower abdomen.  HOME CARE INSTRUCTIONS  Only take over-the-counter or prescription medicine as directed by your health care provider.  Pay attention to the foods you eat and your activity levels. This can help reduce the effects of PCOS.  Keep your weight under control.  Eat foods that are   take over-the-counter or prescription medicine as directed by your health care provider.   Pay attention to the foods you eat and your activity levels. This can help reduce the effects of PCOS.   Keep your weight under control.   Eat foods that are low in carbohydrate and high in fiber.   Exercise regularly.  SEEK MEDICAL CARE IF:   Your symptoms do not get better with medicine.   You have new symptoms.  Document Released: 09/26/2004 Document Revised: 03/23/2013 Document Reviewed: 11/18/2012  [employer] Patient Information 2015 [employer]. This information is not intended to replace advice given to you by your health care provider. Make sure you discuss any questions you have with your health care provider.

## 2015-02-12 NOTE — Progress Notes (Signed)
Patient ID: Kathleen [name], female   DOB: [birth year], 24 y.o.   MRN: [id number] History:  24 y.o. No obstetric history on file. here today for eval of pelvic pain. She has noted sx for 2-3 months.  She was seen in the ED and is s/p [place] and CT scan. She reports that she was told that she had 'cysts on her  Ovaries' she reports a h/o irreg cycles since shortly after menarche.  She reports that her sx improved when she was on OCPs. Pt has lesion on her vulva that she wants eval.  She mentioned it after her exam was complete  The following portions of the patient's history were reviewed and updated as appropriate: allergies, current medications, past family history, past medical history, past social history, past surgical history and problem list.  Review of Systems:  Pertinent items are noted in HPI.  Objective:  Physical Exam Blood pressure 122/83, pulse 91, temperature 98.6 F (37 C), last menstrual period 11/28/2014. Gen: NAD Abd: Soft, nontender and nondistended Pelvic: external genitalia: lesion on left upper inner buttocks/upper thigh- looks like condyloma- needs further eval; normal appearing vaginal mucosa and cervix.  Normal discharge.  Small uterus, no other palpable masses, no uterine or adnexal tenderness  Labs and Imaging  01/07/2015 CLINICAL DATA: Lower abdominal and back pain with vaginal discharge.  EXAM: TRANSABDOMINAL AND TRANSVAGINAL ULTRASOUND OF PELVIS  TECHNIQUE: Both transabdominal and transvaginal ultrasound examinations of the pelvis were performed. Transabdominal technique was performed for global imaging of the pelvis including uterus, ovaries, adnexal regions, and pelvic cul-de-sac. It was necessary to proceed with endovaginal exam following the transabdominal exam to visualize the endometrium and ovaries.  COMPARISON: CT abdomen and pelvis 01/17/2013  FINDINGS: Uterus  Measurements: 5.5 x 3.2 x 3.4 cm. No fibroids or other mass visualized.  Incidental nabothian cysts measuring up to 10 mm.  Endometrium  Thickness: 7 mm. No focal abnormality visualized.  Right ovary  Measurements: 3.6 x 2.3 x 3.1 cm. Numerous small follicles located at the periphery of the ovary. No solid mass.  Left ovary  Measurements: 4.6 x 2.1 x 3.1 cm. Numerous small follicles located at the periphery of the ovary. No solid mass.  Other findings  Trace free fluid.  IMPRESSION: 1. No acute abnormality identified. Normal appearance of the uterus. 2. Mildly prominent number of small follicles located at the periphery of both ovaries. Polycystic ovarian syndrome not excluded.      Assessment & Plan:  Pelvic pain unk etiology- exam benign Oligomenorrhea- Suspect PCOS  Labs: CBC, TSH, HbA1C Sprintec 1 po q day Rec that pt complete financial aid paperwork.  She wants to have lesion on buttocks removed/treated this is not emergent and can wait until her paperwork is completed. F/u 3 moths for reeval of bleeding/ pelvic pain issue  Antwaine Boomhower L. Harraway-Smith, M.D., Cherlynn June

## 2015-02-12 NOTE — Progress Notes (Signed)
Patient ID: Kathleen [name], female   DOB: 02/07/1991, 24 y.o.   MRN: [id number] Patient is taking doxycycline per ER instructions but is unsure why, has questions about medication.

## 2015-02-15 ENCOUNTER — Encounter: Payer: Self-pay | Admitting: Obstetrics & Gynecology

## 2015-02-21 ENCOUNTER — Telehealth: Payer: Self-pay

## 2015-02-21 NOTE — Telephone Encounter (Addendum)
Pt requesting test results.   9/8  1650  Returned call to pt and heard message stating that the subscriber dialed is not in service.   Diane Day [facility] 9/12 1145  Same message heard stating that the subscriber dialed is not in service - unable to leave message.  Diane Day [facility] 9/13 1329  Same message heard stating that the subscriber dialed is not in service.  Letter sent to pt.  Diane Day [facility]

## 2015-02-27 ENCOUNTER — Encounter: Payer: Self-pay | Admitting: *Deleted

## 2015-03-11 ENCOUNTER — Emergency Department (HOSPITAL_COMMUNITY)
Admission: EM | Admit: 2015-03-11 | Discharge: 2015-03-12 | Disposition: A | Discharge status: Home / Self Care | Payer: Self-pay | Attending: Emergency Medicine | Admitting: Emergency Medicine

## 2015-03-11 ENCOUNTER — Emergency Department (HOSPITAL_COMMUNITY): Payer: Self-pay

## 2015-03-11 ENCOUNTER — Encounter (HOSPITAL_COMMUNITY): Payer: Self-pay | Admitting: Emergency Medicine

## 2015-03-11 DIAGNOSIS — Z3202 Encounter for pregnancy test, result negative: Secondary | ICD-10-CM | POA: Insufficient documentation

## 2015-03-11 DIAGNOSIS — F319 Bipolar disorder, unspecified: Secondary | ICD-10-CM | POA: Insufficient documentation

## 2015-03-11 DIAGNOSIS — Z8619 Personal history of other infectious and parasitic diseases: Secondary | ICD-10-CM | POA: Insufficient documentation

## 2015-03-11 DIAGNOSIS — R21 Rash and other nonspecific skin eruption: Secondary | ICD-10-CM | POA: Insufficient documentation

## 2015-03-11 DIAGNOSIS — I10 Essential (primary) hypertension: Secondary | ICD-10-CM | POA: Insufficient documentation

## 2015-03-11 DIAGNOSIS — R197 Diarrhea, unspecified: Secondary | ICD-10-CM | POA: Insufficient documentation

## 2015-03-11 DIAGNOSIS — R1013 Epigastric pain: Secondary | ICD-10-CM | POA: Insufficient documentation

## 2015-03-11 DIAGNOSIS — Z79899 Other long term (current) drug therapy: Secondary | ICD-10-CM | POA: Insufficient documentation

## 2015-03-11 DIAGNOSIS — R1011 Right upper quadrant pain: Secondary | ICD-10-CM | POA: Insufficient documentation

## 2015-03-11 DIAGNOSIS — R1110 Vomiting, unspecified: Secondary | ICD-10-CM | POA: Insufficient documentation

## 2015-03-11 LAB — COMPREHENSIVE METABOLIC PANEL
ALBUMIN: 3.7 g/dL (ref 3.5–5.0)
ALT: 21 U/L (ref 14–54)
AST: 29 U/L (ref 15–41)
Alkaline Phosphatase: 69 U/L (ref 38–126)
Anion gap: 9 (ref 5–15)
BILIRUBIN TOTAL: 0.3 mg/dL (ref 0.3–1.2)
BUN: 6 mg/dL (ref 6–20)
CHLORIDE: 103 mmol/L (ref 101–111)
CO2: 26 mmol/L (ref 22–32)
Calcium: 9.3 mg/dL (ref 8.9–10.3)
Creatinine, Ser: 1.06 mg/dL — ABNORMAL HIGH (ref 0.44–1.00)
GFR calc Af Amer: >60 mL/min (ref >60)
GFR calc non Af Amer: >60 mL/min (ref >60)
GLUCOSE: 159 mg/dL — AB (ref 65–99)
POTASSIUM: 3.8 mmol/L (ref 3.5–5.1)
SODIUM: 138 mmol/L (ref 135–145)
Total Protein: 7.3 g/dL (ref 6.5–8.1)

## 2015-03-11 LAB — URINALYSIS, ROUTINE W REFLEX MICROSCOPIC
BILIRUBIN URINE: NEGATIVE
Glucose, UA: NEGATIVE mg/dL
Ketones, ur: NEGATIVE mg/dL
Leukocytes, UA: NEGATIVE
Nitrite: NEGATIVE
PH: 6 (ref 5.0–8.0)
Protein, ur: NEGATIVE mg/dL
SPECIFIC GRAVITY, URINE: 1.014 (ref 1.005–1.030)
Urobilinogen, UA: 0.2 mg/dL (ref 0.0–1.0)

## 2015-03-11 LAB — CBC
HEMATOCRIT: 37.9 % (ref 36.0–46.0)
Hemoglobin: 12.4 g/dL (ref 12.0–15.0)
MCH: 26.8 pg (ref 26.0–34.0)
MCHC: 32.7 g/dL (ref 30.0–36.0)
MCV: 81.9 fL (ref 78.0–100.0)
Platelets: 218 10*3/uL (ref 150–400)
RBC: 4.63 MIL/uL (ref 3.87–5.11)
RDW: 12.5 % (ref 11.5–15.5)
WBC: 9 10*3/uL (ref 4.0–10.5)

## 2015-03-11 LAB — URINE MICROSCOPIC-ADD ON

## 2015-03-11 LAB — POC URINE PREG, ED: Preg Test, Ur: NEGATIVE

## 2015-03-11 LAB — LIPASE, BLOOD: LIPASE: 24 U/L (ref 22–51)

## 2015-03-11 MED ORDER — IBUPROFEN 800 MG PO TABS
800.0000 mg | ORAL_TABLET | Freq: Once | ORAL | Status: AC
  Administered 2015-03-11: 800 mg via ORAL
  Filled 2015-03-11: qty 1

## 2015-03-11 MED ORDER — ONDANSETRON 4 MG PO TBDP
4.0000 mg | ORAL_TABLET | Freq: Once | ORAL | Status: AC
  Administered 2015-03-11: 4 mg via ORAL
  Filled 2015-03-11: qty 1

## 2015-03-11 NOTE — ED Provider Notes (Signed)
CSN: [id number]     Arrival date & time 03/11/15  2137 History   First MD Initiated Contact with Patient 03/11/15 2325     Chief Complaint  Patient presents with  . Emesis  . Abdominal Pain  . Rash     (Consider location/radiation/quality/duration/timing/severity/associated sxs/prior Treatment) HPI  Kathleen [name] is a 24 y.o. female with no second past medical history presenting today with right upper quadrant and midepigastric abdominal pain for the past 5 days. Patient states this is worse after meals, she has vomiting as well. She also admits to having diarrhea throughout this week. She denies any fevers or recent infections. She has not taken any medication for this. She does work at a [employer] and thinks it may be food poisoning. Patient also complains of lesions around her right breast and pelvic area. She states they are red and painful and then resolve to her skin color. She denies any drainage of pus. She has no further complaints.  10 Systems reviewed and are negative for acute change except as noted in the HPI.     Past Medical History  Diagnosis Date  . Depression   . Bipolar 1 disorder   . Headache(784.0)   . STD (female)   . Hypertension   . Schizophrenia    Past Surgical History  Procedure Laterality Date  . Bunionectomy      both feet   Family History  Problem Relation Age of Onset  . Diabetes Mother   . Hypertension Mother    Social History  Substance Use Topics  . Smoking status: Never Smoker   . Smokeless tobacco: None  . Alcohol Use: No   OB History    No data available     Review of Systems    Allergies  Other  Home Medications   Prior to Admission medications   Medication Sig Start Date End Date Taking? Authorizing Provider  doxycycline (VIBRAMYCIN) 100 MG capsule Take 1 capsule (100 mg total) by mouth 2 (two) times daily. One po bid x 7 days 02/08/15   Carman Ching, PA-C  gabapentin (NEURONTIN) 300 MG capsule Take 1  capsule (300 mg total) by mouth daily. Patient not taking: Reported on 06/23/2014 04/26/14   Baron Sane, PA-C  guaiFENesin-codeine 100-10 MG/5ML syrup Take 5-10 mLs by mouth every 6 (six) hours as needed for cough. Patient not taking: Reported on 02/12/2015 06/23/14   Margarita Mail, PA-C  naproxen (NAPROSYN) 500 MG tablet Take 1 tablet (500 mg total) by mouth 2 (two) times daily with a meal. Patient not taking: Reported on 06/23/2014 01/16/14   Linton Flemings, MD  norgestimate-ethinyl estradiol (ORTHO-CYCLEN,SPRINTEC,PREVIFEM) 0.25-35 MG-MCG tablet Take 1 tablet by mouth daily. 02/12/15   Lavonia Drafts, MD  traMADol (ULTRAM) 50 MG tablet Take 1 tablet (50 mg total) by mouth every 6 (six) hours as needed. Patient not taking: Reported on 02/12/2015 02/08/15   Carman Ching, PA-C  triamcinolone cream (KENALOG) 0.1 % Apply 1 application topically 2 (two) times daily. 02/08/15   Robyn M Hess, PA-C   BP 132/86 mmHg  Pulse 89  Temp(Src) 98.1 F (36.7 C) (Oral)  Ht 5\' 9"  (1.753 m)  Wt 235 lb (106.595 kg)  BMI 34.69 kg/m2  SpO2 100%  LMP 02/18/2015 (Exact Date) Physical Exam  Constitutional: She is oriented to person, place, and time. She appears well-developed and well-nourished. No distress.  HENT:  Head: Normocephalic and atraumatic.  Nose: Nose normal.  Mouth/Throat: Oropharynx is clear  and moist. No oropharyngeal exudate.  Eyes: Conjunctivae and EOM are normal. Pupils are equal, round, and reactive to light. No scleral icterus.  Neck: Normal range of motion. Neck supple. No JVD present. No tracheal deviation present. No thyromegaly present.  Cardiovascular: Normal rate, regular rhythm and normal heart sounds.  Exam reveals no gallop and no friction rub.   No murmur heard. Pulmonary/Chest: Effort normal and breath sounds normal. No respiratory distress. She has no wheezes. She exhibits no tenderness.  Abdominal: Soft. Bowel sounds are normal. She exhibits no distension and no mass. There  is no tenderness. There is no rebound and no guarding.  Right upper quadrant tenderness to palpation. Negative Murphy sign.  Musculoskeletal: Normal range of motion. She exhibits no edema or tenderness.  Lymphadenopathy:    She has no cervical adenopathy.  Neurological: She is alert and oriented to person, place, and time. No cranial nerve deficit. She exhibits normal muscle tone.  Skin: Skin is warm and dry. Rash noted. There is erythema. No pallor.  Small erythematous areas on the right breast, mild tenderness to palpation, no drainage seen, no warmth.  Folliculitis seen in the pelvic region.  Nursing note and vitals reviewed.   ED Course  Procedures (including critical care time) Labs Review Labs Reviewed  COMPREHENSIVE METABOLIC PANEL - Abnormal; Notable for the following:    Glucose, Bld 159 (*)    Creatinine, Ser 1.06 (*)    All other components within normal limits  URINALYSIS, ROUTINE W REFLEX MICROSCOPIC (NOT AT [facility]) - Abnormal; Notable for the following:    Hgb urine dipstick LARGE (*)    All other components within normal limits  LIPASE, BLOOD  CBC  URINE MICROSCOPIC-ADD ON  POC URINE PREG, ED    Imaging Review US Abdomen Limited Ruq  03/12/2015   CLINICAL DATA:  Initial evaluation for acute abdominal pain, nausea, vomiting.  EXAM: US ABDOMEN LIMITED - RIGHT UPPER QUADRANT  COMPARISON:  None.  FINDINGS: Gallbladder:  Gallbladder is contracted, somewhat limiting evaluation. Despite this, gallbladder wall measured within normal limits at 3 mm. No stones identified. No free pericholecystic fluid. No sonographic Murphy sign elicited on exam.  Common bile duct:  Diameter: 2 mm.  Liver:  No focal lesion identified. Liver demonstrated somewhat dense heterogeneous echotexture.  IMPRESSION: 1. Contracted gallbladder with no sonographic evidence for cholelithiasis, acute cholecystitis, or biliary dilatation. 2. Hepatic steatosis.   Electronically Signed   By: Jeannine Boga  M.D.   On: 03/12/2015 00:24   I have personally reviewed and evaluated these images and lab results as part of my medical decision-making.   EKG Interpretation None      MDM   Final diagnoses:  RUQ pain   patient presents emergency department for evaluation of her abdominal pain and rash. Her rash appears to me to look like insect bites that become acutely inflamed and resolved. She never has any drainage of pus. I do not believe antibiotics are warranted. Reassurance was given. Patient was given motrin and zofran for symptoms.  Laboratory studies are unremarkable, right upper quadrant ultrasound does not reveal any acute abnormalities of the gallbladder. Patient believes that the symptoms are related to fast food that she has been eating. This is a possibility. She is advised to stay hydrated and see her primary care physician within 3 days. She appears well in no acute distress, she is talking on her phone in the room, her vital signs were within her normal limits and she is safe  for discharge.  Everlene Balls, MD 03/12/15 0110

## 2015-03-11 NOTE — ED Notes (Signed)
Patient here with complaint of abdominal pain accompanied by nausea and vomiting. Additionally reports skin lesions which she "thinks might be boils". Skin lesions are on chest and lower abdomen at "panty line". Patient concerned about food poisoning citing that she eats a lot of food from [employer] where she works and states "there are crazy people there that do stuff to the food".

## 2015-03-12 NOTE — Discharge Instructions (Signed)
Diarrhea Kathleen [name], stay well hydrated if you continue to have vomiting and diarrhea.  See a primary care physician within 3 days for close follow up.  If symptoms worsen, come  Back to the ED immediately.  Stop eating the fast food you believe is causing your symptoms.  Thank you. Diarrhea is watery poop (stool). It can make you feel weak, tired, thirsty, or give you a dry mouth (signs of dehydration). Watery poop is a sign of another problem, most often an infection. It often lasts 2-3 days. It can last longer if it is a sign of something serious. Take care of yourself as told by your doctor. HOME CARE   Drink 1 cup (8 ounces) of fluid each time you have watery poop.  Do not drink the following fluids:  Those that contain simple sugars (fructose, glucose, galactose, lactose, sucrose, maltose).  Sports drinks.  Fruit juices.  Whole milk products.  Sodas.  Drinks with caffeine (coffee, tea, soda) or alcohol.  Oral rehydration solution may be used if the doctor says it is okay. You may make your own solution. Follow this recipe:   - teaspoon table salt.   teaspoon baking soda.   teaspoon salt substitute containing potassium chloride.  1 tablespoons sugar.  1 liter (34 ounces) of water.  Avoid the following foods:  High fiber foods, such as raw fruits and vegetables.  Nuts, seeds, and whole grain breads and cereals.   Those that are sweetened with sugar alcohols (xylitol, sorbitol, mannitol).  Try eating the following foods:  Starchy foods, such as rice, toast, pasta, low-sugar cereal, oatmeal, baked potatoes, crackers, and bagels.  Bananas.  Applesauce.  Eat probiotic-rich foods, such as yogurt and milk products that are fermented.  Wash your hands well after each time you have watery poop.  Only take medicine as told by your doctor.  Take a warm bath to help lessen burning or pain from having watery poop. GET HELP RIGHT AWAY IF:   You cannot drink fluids  without throwing up (vomiting).  You keep throwing up.  You have blood in your poop, or your poop looks black and tarry.  You do not pee (urinate) in 6-8 hours, or there is only a small amount of very dark pee.  You have belly (abdominal) pain that gets worse or stays in the same spot (localizes).  You are weak, dizzy, confused, or light-headed.  You have a very bad headache.  Your watery poop gets worse or does not get better.  You have a fever or lasting symptoms for more than 2-3 days.  You have a fever and your symptoms suddenly get worse. MAKE SURE YOU:   Understand these instructions.  Will watch your condition.  Will get help right away if you are not doing well or get worse. Document Released: 11/19/2007 Document Revised: 10/17/2013 Document Reviewed: 02/08/2012 [facility] Patient Information 2015 [place], [place]. This information is not intended to replace advice given to you by your health care provider. Make sure you discuss any questions you have with your health care provider. Nausea and Vomiting Nausea means you feel sick to your stomach. Throwing up (vomiting) is a reflex where stomach contents come out of your mouth. HOME CARE   Take medicine as told by your doctor.  Do not force yourself to eat. However, you do need to drink fluids.  If you feel like eating, eat a normal diet as told by your doctor.  Eat rice, wheat, potatoes, bread, lean meats,  yogurt, fruits, and vegetables.  Avoid high-fat foods.  Drink enough fluids to keep your pee (urine) clear or pale yellow.  Ask your doctor how to replace body fluid losses (rehydrate). Signs of body fluid loss (dehydration) include:  Feeling very thirsty.  Dry lips and mouth.  Feeling dizzy.  Dark pee.  Peeing less than normal.  Feeling confused.  Fast breathing or heart rate. GET HELP RIGHT AWAY IF:   You have blood in your throw up.  You have black or bloody poop (stool).  You have a bad  headache or stiff neck.  You feel confused.  You have bad belly (abdominal) pain.  You have chest pain or trouble breathing.  You do not pee at least once every 8 hours.  You have cold, clammy skin.  You keep throwing up after 24 to 48 hours.  You have a fever. MAKE SURE YOU:   Understand these instructions.  Will watch your condition.  Will get help right away if you are not doing well or get worse. Document Released: 11/19/2007 Document Revised: 08/25/2011 Document Reviewed: 11/01/2010 [facility] Patient Information 2015 [employer], [place]. This information is not intended to replace advice given to you by your health care provider. Make sure you discuss any questions you have with your health care provider.

## 2015-03-13 ENCOUNTER — Telehealth: Payer: Self-pay | Admitting: General Practice

## 2015-03-13 NOTE — Telephone Encounter (Signed)
Per Dr Ihor Dow, patient's pre diabetes screen is positive and needs to follow up with PCP. Patient should be referred to a PCP if she doesn't have one. Called patient and message states this number is invalid. Will send mychart message

## 2015-03-15 ENCOUNTER — Telehealth: Payer: Self-pay | Admitting: General Practice

## 2015-03-15 DIAGNOSIS — R7303 Prediabetes: Secondary | ICD-10-CM

## 2015-03-15 NOTE — Telephone Encounter (Signed)
Telephone call to patient regarding confusion about mychart message. No answer- left message stating we are trying to reach you to answer your questions, please call us back at the clinics

## 2015-03-15 NOTE — Telephone Encounter (Signed)
Patient called back in to front office and we discussed hgba1c results and recommended pcp. Patient verbalized understanding and states she does not have a primary care provider. Told patient we will refer her to [facility] but she needs to call and set up appt. Provided their information to patient. Patient verbalized understanding and had no questions

## 2015-04-20 ENCOUNTER — Encounter (HOSPITAL_COMMUNITY): Payer: Self-pay

## 2015-04-20 ENCOUNTER — Emergency Department (HOSPITAL_COMMUNITY)
Admission: EM | Admit: 2015-04-20 | Discharge: 2015-04-20 | Disposition: A | Discharge status: Home / Self Care | Payer: Self-pay | Attending: Emergency Medicine | Admitting: Emergency Medicine

## 2015-04-20 DIAGNOSIS — Z8619 Personal history of other infectious and parasitic diseases: Secondary | ICD-10-CM | POA: Insufficient documentation

## 2015-04-20 DIAGNOSIS — I10 Essential (primary) hypertension: Secondary | ICD-10-CM | POA: Insufficient documentation

## 2015-04-20 DIAGNOSIS — Z8659 Personal history of other mental and behavioral disorders: Secondary | ICD-10-CM | POA: Insufficient documentation

## 2015-04-20 DIAGNOSIS — J029 Acute pharyngitis, unspecified: Secondary | ICD-10-CM | POA: Insufficient documentation

## 2015-04-20 MED ORDER — DEXAMETHASONE SODIUM PHOSPHATE 10 MG/ML IJ SOLN
10.0000 mg | Freq: Once | INTRAMUSCULAR | Status: AC
  Administered 2015-04-20: 10 mg via INTRAMUSCULAR
  Filled 2015-04-20: qty 1

## 2015-04-20 MED ORDER — KETOROLAC TROMETHAMINE 60 MG/2ML IM SOLN
60.0000 mg | Freq: Once | INTRAMUSCULAR | Status: AC
  Administered 2015-04-20: 60 mg via INTRAMUSCULAR
  Filled 2015-04-20: qty 2

## 2015-04-20 NOTE — ED Provider Notes (Signed)
CSN: [id number]     Arrival date & time 04/20/15  4098 History   First MD Initiated Contact with Patient 04/20/15 0544     Chief Complaint  Patient presents with  . Sore Throat     (Consider location/radiation/quality/duration/timing/severity/associated sxs/prior Treatment) HPI  Kathleen [name] is a 24yo female, no sig PMH, here with R throat pain x 24 hours.  She states the pain became so intense that she had an episode of vomiting as well.  She denies sick contacts. She has had rhinorrhea and mild nonproductive cough as well.  She has had subjective fevers as well.  She has not taken anything for her pain.  She states she may have chocked on a fishbone several months ago and thinks this may be related to her pain now.  She is requesting ENT referral.  She has no further complaints.     10 Systems reviewed and are negative for acute change except as noted in the HPI.    Past Medical History  Diagnosis Date  . Depression   . Bipolar 1 disorder ([place])   . Headache(784.0)   . STD (female)   . Hypertension   . Schizophrenia [facility])    Past Surgical History  Procedure Laterality Date  . Bunionectomy      both feet   Family History  Problem Relation Age of Onset  . Diabetes Mother   . Hypertension Mother    Social History  Substance Use Topics  . Smoking status: Never Smoker   . Smokeless tobacco: None  . Alcohol Use: No   OB History    No data available     Review of Systems    Allergies  Other  Home Medications   Prior to Admission medications   Not on File   BP 121/75 mmHg  Pulse 95  Temp(Src) 98.9 F (37.2 C) (Oral)  Resp 16  Ht 5\' 8"  (1.727 m)  Wt 235 lb (106.595 kg)  BMI 35.74 kg/m2  SpO2 99%  LMP 03/31/2015 (Approximate) Physical Exam  Constitutional: She is oriented to person, place, and time. She appears well-developed and well-nourished. No distress.  HENT:  Head: Normocephalic and atraumatic.  Nose: Nose normal.  Mouth/Throat: Oropharynx is  clear and moist. No oropharyngeal exudate.  Eyes: Conjunctivae and EOM are normal. Pupils are equal, round, and reactive to light. No scleral icterus.  Neck: Normal range of motion. Neck supple. No JVD present. No tracheal deviation present. No thyromegaly present.  Cardiovascular: Normal rate, regular rhythm and normal heart sounds.  Exam reveals no gallop and no friction rub.   No murmur heard. Pulmonary/Chest: Effort normal and breath sounds normal. No respiratory distress. She has no wheezes. She exhibits no tenderness.  Abdominal: Soft. Bowel sounds are normal. She exhibits no distension and no mass. There is no tenderness. There is no rebound and no guarding.  Musculoskeletal: Normal range of motion. She exhibits no edema or tenderness.  Lymphadenopathy:    She has no cervical adenopathy.  Neurological: She is alert and oriented to person, place, and time. No cranial nerve deficit. She exhibits normal muscle tone.  Skin: Skin is warm and dry. No rash noted. No erythema. No pallor.  Nursing note and vitals reviewed.   ED Course  Procedures (including critical care time) Labs Review Labs Reviewed - No data to display  Imaging Review No results found. I have personally reviewed and evaluated these images and lab results as part of my medical decision-making.   EKG  Interpretation None      MDM   Final diagnoses:  Pharyngitis     Patient presents to the Ed for sore throat.  PE is unremarkable.  This may be a viral pharyngitis.  I do not believe the possible fishbone several months prior is related to her pain now.  Will give toradol and decadron for pain control.  Oropharynx is clear.  ENT follow up ordered as well.  She appears well and in NAD.  VS remain within her normal limits and she is safe for DC.   Everlene Balls, MD 04/20/15 [phone]

## 2015-04-20 NOTE — Discharge Instructions (Signed)
Pharyngitis Kathleen [name],  You were given medications to treat or pharyngitis. See ENT within 3 days for close follow-up. If any symptoms worsen come back to emergency department immediately. Thank you. Pharyngitis is a sore throat (pharynx). There is redness, pain, and swelling of your throat. HOME CARE   Drink enough fluids to keep your pee (urine) clear or pale yellow.  Only take medicine as told by your doctor.  You may get sick again if you do not take medicine as told. Finish your medicines, even if you start to feel better.  Do not take aspirin.  Rest.  Rinse your mouth (gargle) with salt water ( tsp of salt per 1 qt of water) every 1-2 hours. This will help the pain.  If you are not at risk for choking, you can suck on hard candy or sore throat lozenges. GET HELP IF:  You have large, tender lumps on your neck.  You have a rash.  You cough up green, yellow-brown, or bloody spit. GET HELP RIGHT AWAY IF:   You have a stiff neck.  You drool or cannot swallow liquids.  You throw up (vomit) or are not able to keep medicine or liquids down.  You have very bad pain that does not go away with medicine.  You have problems breathing (not from a stuffy nose). MAKE SURE YOU:   Understand these instructions.  Will watch your condition.  Will get help right away if you are not doing well or get worse.   This information is not intended to replace advice given to you by your health care provider. Make sure you discuss any questions you have with your health care provider.   Document Released: 11/19/2007 Document Revised: 03/23/2013 Document Reviewed: 02/07/2013 Elsevier Interactive Patient Education [employer].

## 2015-04-20 NOTE — ED Notes (Signed)
Pt states she started having a sore throat around 12 am; Pt states she went to drink a sip of water and she begin to vomit for about 20 minutes; pt states contents was brown and came out of her nose and month; Pt states she feel like she is struggling to breath; Pt states sore throat at 9/10 on arrival. Pt a&o x 4 on arrival

## 2015-04-20 NOTE — ED Notes (Signed)
MD at bedside. 

## 2015-04-23 ENCOUNTER — Encounter (HOSPITAL_COMMUNITY): Payer: Self-pay | Admitting: *Deleted

## 2015-04-23 ENCOUNTER — Emergency Department (HOSPITAL_COMMUNITY)
Admission: EM | Admit: 2015-04-23 | Discharge: 2015-04-23 | Disposition: A | Discharge status: Home / Self Care | Payer: Self-pay | Attending: Emergency Medicine | Admitting: Emergency Medicine

## 2015-04-23 DIAGNOSIS — J029 Acute pharyngitis, unspecified: Secondary | ICD-10-CM | POA: Insufficient documentation

## 2015-04-23 DIAGNOSIS — Z8659 Personal history of other mental and behavioral disorders: Secondary | ICD-10-CM | POA: Insufficient documentation

## 2015-04-23 DIAGNOSIS — I10 Essential (primary) hypertension: Secondary | ICD-10-CM | POA: Insufficient documentation

## 2015-04-23 DIAGNOSIS — Z8619 Personal history of other infectious and parasitic diseases: Secondary | ICD-10-CM | POA: Insufficient documentation

## 2015-04-23 LAB — RAPID STREP SCREEN (MED CTR MEBANE ONLY): STREPTOCOCCUS, GROUP A SCREEN (DIRECT): NEGATIVE

## 2015-04-23 MED ORDER — LIDOCAINE VISCOUS 2 % MT SOLN: 20.0000 mL | OROMUCOSAL | Status: DC | PRN

## 2015-04-23 MED ORDER — KETOROLAC TROMETHAMINE 30 MG/ML IJ SOLN
30.0000 mg | Freq: Once | INTRAMUSCULAR | Status: AC
  Administered 2015-04-23: 30 mg via INTRAMUSCULAR
  Filled 2015-04-23: qty 1

## 2015-04-23 NOTE — ED Notes (Signed)
Declined W/C at D/C and was escorted to lobby by RN. 

## 2015-04-23 NOTE — ED Notes (Signed)
PT returns today with on going sore throat and swollen tonsils . Pt last seen on 04-20-15 in ED.

## 2015-04-23 NOTE — ED Provider Notes (Signed)
CSN: [id number]     Arrival date & time 04/23/15  4235 History   First MD Initiated Contact with Patient 04/23/15 0739     Chief Complaint  Patient presents with  . Sore Throat     (Consider location/radiation/quality/duration/timing/severity/associated sxs/prior Treatment) Patient is a 24 y.o. female presenting with pharyngitis. The history is provided by the patient. No language interpreter was used.  Sore Throat Associated symptoms include a fever and a sore throat. Pertinent negatives include no coughing or nausea.   Kathleen [name] is a 24 year old female with no significant past medical history who presents with a worsening sore throat 4 days. She states that it is difficult to breathe especially at night when laying down. She states that she is scared to go to sleep because she thinks that she may stop breathing. She also states that she knows it's her tonsils because she has had this in the past. She was seen here 4 days ago for the same. She was given an injection of Decadron and Toradol. She states it only worked for 24 hours and then she felt the same way. She also states she had a fever of (Tmax 100.0) last night after checking it with a thermometer. She also mentioned that she choked on a fish bone several months ago and thinks that it may be related to that. She denies any sick contacts. She denies any treatment prior to arrival.  She denies any drooling, trismus, ear pain, cough.   Past Medical History  Diagnosis Date  . Depression   . Bipolar 1 disorder ([place])   . Headache(784.0)   . STD (female)   . Hypertension   . Schizophrenia [facility])    Past Surgical History  Procedure Laterality Date  . Bunionectomy      both feet   Family History  Problem Relation Age of Onset  . Diabetes Mother   . Hypertension Mother    Social History  Substance Use Topics  . Smoking status: Never Smoker   . Smokeless tobacco: None  . Alcohol Use: No   OB History    No data available      Review of Systems  Constitutional: Positive for fever.  HENT: Positive for sore throat. Negative for drooling, trouble swallowing and voice change.   Respiratory: Negative for cough and shortness of breath.   Gastrointestinal: Negative for nausea.      Allergies  Other  Home Medications   Prior to Admission medications   Medication Sig Start Date End Date Taking? Authorizing Provider  lidocaine (XYLOCAINE) 2 % solution Use as directed 20 mLs in the mouth or throat as needed for mouth pain. 04/23/15   Deeksha Cotrell Patel-Mills, PA-C   BP 151/88 mmHg  Pulse 105  Temp(Src) 97.9 F (36.6 C) (Oral)  Resp 20  SpO2 97%  LMP 03/31/2015 (Approximate) Physical Exam  Constitutional: She is oriented to person, place, and time. She appears well-developed and well-nourished.  HENT:  Head: Normocephalic and atraumatic.  Mouth/Throat: Uvula is midline, oropharynx is clear and moist and mucous membranes are normal. No trismus in the jaw. No uvula swelling. No oropharyngeal exudate, posterior oropharyngeal edema, posterior oropharyngeal erythema or tonsillar abscesses.  Her oropharynx is clear and moist. She has no erythema, edema, or exudates. No respiratory compromise. She is resting and breathing comfortably. No trismus or drooling. No anterior cervical lymphadenopathy. No uvula swelling.  Eyes: Conjunctivae are normal.  Neck: Neck supple.  Cardiovascular: Normal rate.   Pulmonary/Chest: Effort normal. No respiratory  distress.  Lungs are clear to auscultation bilaterally. No wheezing or decreased breath sounds.  Abdominal:  Abdomen (LUQ) is soft and nontender.  Neurological: She is alert and oriented to person, place, and time.  Skin: Skin is warm and dry.  Psychiatric: She has a normal mood and affect.  Nursing note and vitals reviewed.   ED Course  Procedures (including critical care time) Labs Review Labs Reviewed  RAPID STREP SCREEN (NOT AT [facility])  CULTURE, GROUP A STREP    Imaging  Review No results found. I have personally reviewed and evaluated these lab results as part of my medical decision-making.   EKG Interpretation None      MDM   Final diagnoses:  Pharyngitis  Patient presents for worsening sore throat over the past 4 days. She has a normal exam and there is no concern for tonsillar abscess, dental abscess, or respiratory compromise. Patient was evaluated for the same 4 days ago and was given an injection of Toradol and Decadron. Her vitals are stable and she is well-appearing. Patient's throat was swabbed to rule out strep. I discussed following up with ENT. She may have sleep apnea from the discussion of her symptoms at night. I discussed following up with a primary care physician. She was given the resource guide. She was given ENT follow-up at her previous visit 4 days ago. I gave her IM Toradol today. I discussed return precautions with the patient as well as follow-up and she verbally agrees with the plan.  Rx: Viscous lidocaine 2% Filed Vitals:   04/23/15 0858  BP: 151/88  Pulse: 105  Temp: 97.9 F (36.6 C)  Resp: [address]     Ottie Glazier, PA-C 04/23/15 [place], MD 04/23/15 1614

## 2015-04-23 NOTE — Discharge Instructions (Signed)
Pharyngitis Pharyngitis is redness, pain, and swelling (inflammation) of your pharynx.  CAUSES  Pharyngitis is usually caused by infection. Most of the time, these infections are from viruses (viral) and are part of a cold. However, sometimes pharyngitis is caused by bacteria (bacterial). Pharyngitis can also be caused by allergies. Viral pharyngitis may be spread from person to person by coughing, sneezing, and personal items or utensils (cups, forks, spoons, toothbrushes). Bacterial pharyngitis may be spread from person to person by more intimate contact, such as kissing.  SIGNS AND SYMPTOMS  Symptoms of pharyngitis include:   Sore throat.   Tiredness (fatigue).   Low-grade fever.   Headache.  Joint pain and muscle aches.  Skin rashes.  Swollen lymph nodes.  Plaque-like film on throat or tonsils (often seen with bacterial pharyngitis). DIAGNOSIS  Your health care provider will ask you questions about your illness and your symptoms. Your medical history, along with a physical exam, is often all that is needed to diagnose pharyngitis. Sometimes, a rapid strep test is done. Other lab tests may also be done, depending on the suspected cause.  TREATMENT  Viral pharyngitis will usually get better in 3-4 days without the use of medicine. Bacterial pharyngitis is treated with medicines that kill germs (antibiotics).  HOME CARE INSTRUCTIONS   Drink enough water and fluids to keep your urine clear or pale yellow.   Only take over-the-counter or prescription medicines as directed by your health care provider:   If you are prescribed antibiotics, make sure you finish them even if you start to feel better.   Do not take aspirin.   Get lots of rest.   Gargle with 8 oz of salt water ( tsp of salt per 1 qt of water) as often as every 1-2 hours to soothe your throat.   Throat lozenges (if you are not at risk for choking) or sprays may be used to soothe your throat. SEEK MEDICAL  CARE IF:   You have large, tender lumps in your neck.  You have a rash.  You cough up green, yellow-brown, or bloody spit. SEEK IMMEDIATE MEDICAL CARE IF:   Your neck becomes stiff.  You drool or are unable to swallow liquids.  You vomit or are unable to keep medicines or liquids down.  You have severe pain that does not go away with the use of recommended medicines.  You have trouble breathing (not caused by a stuffy nose). MAKE SURE YOU:   Understand these instructions.  Will watch your condition.  Will get help right away if you are not doing well or get worse.   This information is not intended to replace advice given to you by your health care provider. Make sure you discuss any questions you have with your health care provider.   Document Released: 06/02/2005 Document Revised: 03/23/2013 Document Reviewed: 02/07/2013 Elsevier Interactive Patient Education 2016 [employer].  Emergency Department Resource Guide 1) Find a Doctor and Pay Out of Pocket Although you won't have to find out who is covered by your insurance plan, it is a good idea to ask around and get recommendations. You will then need to call the office and see if the doctor you have chosen will accept you as a new patient and what types of options they offer for patients who are self-pay. Some doctors offer discounts or will set up payment plans for their patients who do not have insurance, but you will need to ask so you aren't surprised when you get  to your appointment.  2) Contact Your Local Health Department Not all health departments have doctors that can see patients for sick visits, but many do, so it is worth a call to see if yours does. If you don't know where your local health department is, you can check in your phone book. The CDC also has a tool to help you locate your state's health department, and many state websites also have listings of all of their local health departments.  3) Find a  [facility] If your illness is not likely to be very severe or complicated, you may want to try a walk in clinic. These are popping up all over the country in pharmacies, drugstores, and shopping centers. They're usually staffed by nurse practitioners or physician assistants that have been trained to treat common illnesses and complaints. They're usually fairly quick and inexpensive. However, if you have serious medical issues or chronic medical problems, these are probably not your best option.  No Primary Care Doctor: - Call Health Connect at  [phone] - they can help you locate a primary care doctor that  accepts your insurance, provides certain services, etc. - Physician Referral Service- [phone]  Chronic Pain Problems: Organization         Address  Phone   Notes  [facility]  [phone] Patients need to be referred by their primary care doctor.   Medication Assistance: Organization         Address  Phone   Notes  [facility] [address] [place], [place] [address] [phone] --Must be a resident of [facility] -- Must have NO insurance coverage whatsoever (no Medicaid/ Medicare, etc.) -- The pt. MUST have a primary care doctor that directs their care regularly and follows them in the community   [facility]  [phone]   [employer]  [phone]    Agencies that provide inexpensive medical care: Organization         Address  Phone   Notes  [place]  [phone]   [facility]    [phone]   [facility] [place], [place] [address] [phone]   [place] [address], [place] [phone]   Planned Parenthood    [phone]   [facility]    [phone]   [place] and [place] [address], [place] Phone:  [phone], Fax:  [phone] Hours of Operation:  9 am - 6 pm, M-F.  Also accepts Medicaid/Medicare and self-pay.  [facility] for [place] [address], [address], [place] Phone: [phone], Fax: [phone]. Hours of Operation:  8:30 am - 5:30 pm, M-F.  Also accepts Medicaid and self-pay.  [facility] [place] [address], [place] Phone: [phone]   [place], [place], [place] [phone], Ext. [phone] Mondays & Thursdays: 7-9 AM.  First 15 patients are seen on a first come, first serve basis.    [place] Providers:  Organization         Address  Phone   Notes  [facility] [address], [place] [phone] Also accepts self-pay patients.  [place], [place]  [phone]   [place]  [address], [place] [phone]   [place] [address] [place] [phone]   Lucianne Lei [address], [place]   [phone] Only accepts [place] Access [place] patients after they have their name applied to their card.   Self-Pay (no insurance) in [facility]:  Organization         Address  Phone   Notes  Sickle Cell Patients, [facility] [place] [phone]   [facility] [place] [phone]   [facility]  [place], [place], [place] [phone]   [facility]/Dr. Osei-Bonsu  [address] [place] or [address], [place] [phone] Phone number for both [place] and [place] locations is the same.  [facility] [address], [place] [phone]   [facility] [address] [place] or [address] [phone]     [facility] [address], [place] [phone], phone; [phone], fax Sees patients 1st and 3rd Saturday of every month.  Must not qualify for public or private insurance (i.e. Medicaid, Medicare, [place] Health Choice, Veterans' Benefits)  Household income should be no more than 200% of the poverty level The clinic cannot treat you if you are pregnant or think you are pregnant  Sexually transmitted diseases are not treated at the clinic.    Dental Care: Organization         Address  Phone  Notes  [facility] [place] [phone] Accepts children up to age 51 who are enrolled in [place] or [place]; pregnant women with a Medicaid card; and children who have applied for Medicaid or [place] Health Choice, but were declined, whose parents can pay a reduced fee at time of service.  [facility]  [address], [place] [phone] Accepts children up to age 82 who are enrolled in [place] or [place]; pregnant women with a Medicaid card; and children who have applied for Medicaid or [place] Health Choice, but were declined, whose parents can pay a reduced fee at time of service.  [place] Adult [facility] Access PROGRAM  [place] [phone] Patients are seen by appointment only. Walk-ins are not accepted. [place] will see patients 63 years of age and older. Monday - Tuesday (8am-5pm) Most Wednesdays (8:30-5pm) $30 per visit, cash only  [facility] Adult Dental Access PROGRAM  [address], [facility] [phone] Patients are seen by appointment only. Walk-ins are not accepted. [place] will see patients 44 years of age and older. One Wednesday Evening (Monthly: Volunteer Based).  $30 per visit, cash only  [place]  [phone] for adults; Children under age 1, call  Graduate Pediatric Dentistry at [phone]. Children aged 40-14, please call [phone] to request a pediatric application.  Dental services are provided in all areas of dental care including fillings, crowns and bridges, complete and partial dentures, implants, gum treatment, root canals, and extractions. Preventive care is also provided. Treatment is provided to both adults and children. Patients are selected via a lottery and there is often a waiting list.   [facility] [address] [place]  [phone] [email or web address]   [facility] [address] [place], [place] [phone], Ext. [phone] Second and Fourth Thursday of each month, opens at 6:30 AM; Clinic ends at 9 AM.  Patients are seen on a first-come first-served basis, and a limited number are seen during each clinic.   [facility]  [address] Hillard Danker [place], [place] [phone]   Eligibility Requirements You must have lived in [place], [place], or [place] counties for at least the last three months.   You cannot be eligible for state or federal sponsored [employer], including [employer], [place], or [employer].   You generally cannot be eligible for healthcare insurance through your employer.    How to apply: Eligibility screenings are held every Tuesday and Wednesday afternoon from 1:00 pm until 4:00 pm. You do not need an appointment for the interview!  [facility] [address] [place], [place]   [place]  [place] Department  [place]  [phone]    Behavioral Health Resources in the Community: Intensive Outpatient Programs Organization         Address  Phone  Notes  [place]. [address] [place], [place] [phone]   [facility] Outpatient [address] [place], [place]   ADS: Alcohol & Drug Svcs [address], [place], [place]   [place] [address],  [place], [place] or [phone]   Substance Abuse Resources Organization         Address  Phone  Notes  Alcohol and Drug Services  [phone]   [place]  [phone]   The [place]   Chinita Pester  [phone]   Residential & Outpatient Substance Abuse Program  [phone]   Psychological Services Organization         Address  Phone  Notes  [facility] [place]  [place]  [phone]   [place] [address] [place] or [phone]    Mobile Crisis Teams Organization         Address  Phone  Notes  [facility], Mobile Crisis Care Unit  [phone]   [facility]  [address] [place], [place]   [place] [address], [place] [phone]    Self-Help/Support Groups Organization         Address  Phone             Notes  [place]. of [place] - variety of support groups  [place] Call for more information  Narcotics Anonymous (NA), [facility] [address], [employer]  2 meetings at this location   Special educational needs teacher         Address  Phone  Notes  ASAP Residential Treatment [place],    [place]  [phone]   [facility]  [address] [place] [address], [place], [place]   [place], [place] [phone] Admissions: 8am-3pm M-F  Incentives Substance [place] [address],    [place], [place] [phone]   [facility] [address] Jadene Pierini [place], [place]   [facility] [place].,  [place],  [place] - Intensive Outpatient [address], [place], [place]   [facility] ([place].) [place].,  [place], [place] [phone] or [phone]   [facility]idential Treatment Services (RTS) [address] [place], [place] Accepts Medicaid  [facility] [place] [address],  [place] [phone] Substance Abuse/Addiction Treatment   [facility] Organization         Address  Phone  Notes  [facility]  [phone]   Domenic Schwab, PhD [address] Arlis Porta [place], [place]   [phone] or [phone]   [place], [place] [phone]   [place], [place], [place] [phone] Insurance/Medicaid/sponsorship through [facility] and Families [address]                                    [place], [place] [phone] [place] [address], [place] [phone]    Dr. Adele Schilder  [phone]   [facility] Dept. 1) [address] [place] 2) [place] 3)  [place] [address], [place] [phone]  [phone]   Rosa Sanchez [phone] or [phone] (After Hours)

## 2015-04-24 ENCOUNTER — Emergency Department (HOSPITAL_COMMUNITY): Payer: Self-pay

## 2015-04-24 ENCOUNTER — Encounter (HOSPITAL_COMMUNITY): Payer: Self-pay | Admitting: Emergency Medicine

## 2015-04-24 ENCOUNTER — Emergency Department (HOSPITAL_COMMUNITY)
Admission: EM | Admit: 2015-04-24 | Discharge: 2015-04-24 | Disposition: A | Discharge status: Home / Self Care | Payer: Medicaid Other | Attending: Emergency Medicine | Admitting: Emergency Medicine

## 2015-04-24 DIAGNOSIS — Z8659 Personal history of other mental and behavioral disorders: Secondary | ICD-10-CM | POA: Insufficient documentation

## 2015-04-24 DIAGNOSIS — Z8619 Personal history of other infectious and parasitic diseases: Secondary | ICD-10-CM | POA: Insufficient documentation

## 2015-04-24 DIAGNOSIS — J329 Chronic sinusitis, unspecified: Secondary | ICD-10-CM

## 2015-04-24 DIAGNOSIS — R0602 Shortness of breath: Secondary | ICD-10-CM | POA: Insufficient documentation

## 2015-04-24 DIAGNOSIS — I10 Essential (primary) hypertension: Secondary | ICD-10-CM | POA: Insufficient documentation

## 2015-04-24 DIAGNOSIS — R05 Cough: Secondary | ICD-10-CM | POA: Insufficient documentation

## 2015-04-24 DIAGNOSIS — R0982 Postnasal drip: Secondary | ICD-10-CM | POA: Insufficient documentation

## 2015-04-24 MED ORDER — FLUTICASONE PROPIONATE 50 MCG/ACT NA SUSP: 2.0000 | Freq: Every day | NASAL | Status: DC

## 2015-04-24 MED ORDER — FEXOFENADINE HCL 60 MG PO TABS: 60.0000 mg | ORAL_TABLET | Freq: Two times a day (BID) | ORAL | Status: DC

## 2015-04-24 MED ORDER — PSEUDOEPHEDRINE HCL 30 MG PO TABS: 30.0000 mg | ORAL_TABLET | ORAL | Status: DC | PRN

## 2015-04-24 MED ORDER — IBUPROFEN 600 MG PO TABS: 600.0000 mg | ORAL_TABLET | Freq: Four times a day (QID) | ORAL | Status: DC | PRN

## 2015-04-24 NOTE — ED Notes (Signed)
Patient states she has [facility] ([facility] & 11/7) dx with pharyngitis. Patient states she "feels like she is choking on her mucous, she is afraid to go to sleep because she thinks she can't breathe". Patient reports blood streaking in her mucous this am. Patient is able to speak in complete sentences.

## 2015-04-24 NOTE — Discharge Instructions (Signed)
Start taking allegra daily. Take flonase as prescribed daily as well for congestion. Sudafed for congestion as needed. Follow up with primary care doctor. Return if worsening symptoms.

## 2015-04-24 NOTE — ED Provider Notes (Signed)
CSN: [id number]     Arrival date & time 04/24/15  1028 History   First MD Initiated Contact with Patient 04/24/15 1045     Chief Complaint  Patient presents with  . Sore Throat     (Consider location/radiation/quality/duration/timing/severity/associated sxs/prior Treatment) HPI Kathleen [name] is a 24 y.o. female with history of depression, bipolar disorder, headache, schizophrenia, presents to emergency department complaining of sore throat, nasal congestion, cough, "choking on mucus." Patient states she has had sore throat for about a week. She has been seen in emergency department twice for the same. She states she was diagnosed with pharyngitis. She was given Toradol injection and Decadron which she states did not help. She states that pain is not that severe but her main issue is nasal congestion and she feels like there is mucus buildup deep in her throat which she could not cough up. She states that when she sleeps she wakes up gasping for breath and coughing up mucus. She reports some blood tinge to her mucus. She denies taking any medications for this. She states she had a fever up to 100 at home 2 days ago, none since then.    Past Medical History  Diagnosis Date  . Depression   . Bipolar 1 disorder ([place])   . Headache(784.0)   . STD (female)   . Hypertension   . Schizophrenia [facility] [place])    Past Surgical History  Procedure Laterality Date  . Bunionectomy      both feet   Family History  Problem Relation Age of Onset  . Diabetes Mother   . Hypertension Mother    Social History  Substance Use Topics  . Smoking status: Never Smoker   . Smokeless tobacco: None  . Alcohol Use: No   OB History    No data available     Review of Systems  Constitutional: Negative for fever and chills.  HENT: Positive for congestion and postnasal drip. Negative for mouth sores and nosebleeds.   Respiratory: Positive for cough, choking and shortness of breath. Negative for chest tightness.    Cardiovascular: Negative for chest pain, palpitations and leg swelling.  Gastrointestinal: Negative for nausea, vomiting, abdominal pain and diarrhea.  Genitourinary: Negative for dysuria, flank pain and pelvic pain.  Musculoskeletal: Negative for myalgias, arthralgias, neck pain and neck stiffness.  Skin: Negative for rash.  Neurological: Negative for dizziness, weakness and headaches.  All other systems reviewed and are negative.     Allergies  Other  Home Medications   Prior to Admission medications   Medication Sig Start Date End Date Taking? Authorizing Provider  lidocaine (XYLOCAINE) 2 % solution Use as directed 20 mLs in the mouth or throat as needed for mouth pain. 04/23/15   Hanna Patel-Mills, PA-C   BP 134/91 mmHg  Pulse 92  Temp(Src) 98.4 F (36.9 C) (Oral)  Resp 18  SpO2 94%  LMP 03/31/2015 (Approximate) Physical Exam  Constitutional: She is oriented to person, place, and time. She appears well-developed and well-nourished. No distress.  HENT:  Nasal congestion present. No sinus tenderness. Oropharynx normal with no erythema, swelling of the tonsils, swelling of the uvula. Uvula is midline. Postnasal drainage present  Eyes: Conjunctivae are normal.  Neck: Neck supple.  Cardiovascular: Normal rate, regular rhythm and normal heart sounds.   Pulmonary/Chest: Effort normal and breath sounds normal. No respiratory distress. She has no wheezes. She has no rales.  Neurological: She is alert and oriented to person, place, and time.  Skin: Skin is  warm and dry.  Nursing note and vitals reviewed.   ED Course  Procedures (including critical care time) Labs Review Labs Reviewed - No data to display  Imaging Review Dg Neck Soft Tissue  04/24/2015  CLINICAL DATA:  Choking sensation. Feels like something is stuck in her throat. EXAM: NECK SOFT TISSUES - 1+ VIEW COMPARISON:  Cervical spine radiographs 09/24/2012 FINDINGS: No radiopaque foreign body is present. The  laryngeal and supraglottic soft tissues are stable compared to the prior study. The cervical spine is unremarkable. The airway is patent. The lung apices are clear. IMPRESSION: Negative soft tissue neck radiographs Electronically Signed   By: San Morelle M.D.   On: 04/24/2015 11:52   I have personally reviewed and evaluated these images and lab results as part of my medical decision-making.   EKG Interpretation None      MDM   Final diagnoses:  Purulent postnasal drainage   Patient with persistent sore throat and congestion. She is afebrile. Nontoxic appearing. Her oropharynx is normal with no evidence of tonsillar enlargement, uvular deviation. Lungs are clear on exam. She is mainly complaining of mucus buildup in her throat and postnasal drainage. We'll start her decongestants, Sudafed, allergy medications, Flonase. Patient's soft tissue neck x-rays negative. Plan to follow-up with primary care doctor.  Filed Vitals:   04/24/15 1100 04/24/15 1218  BP: 134/91   Pulse: 92 88  Temp: 98.4 F (36.9 C)   TempSrc: Oral   Resp: 18   SpO2: 94% 100%     Jeannett Senior, PA-C 04/24/15 1845  Blanchie Dessert, MD 04/25/15 1956

## 2015-04-25 LAB — CULTURE, GROUP A STREP

## 2015-04-27 ENCOUNTER — Encounter (HOSPITAL_COMMUNITY): Payer: Self-pay | Admitting: *Deleted

## 2015-04-27 ENCOUNTER — Emergency Department (HOSPITAL_COMMUNITY)
Admission: EM | Admit: 2015-04-27 | Discharge: 2015-04-27 | Disposition: A | Discharge status: Home / Self Care | Payer: Medicaid Other | Attending: Emergency Medicine | Admitting: Emergency Medicine

## 2015-04-27 DIAGNOSIS — J020 Streptococcal pharyngitis: Secondary | ICD-10-CM

## 2015-04-27 DIAGNOSIS — Z8619 Personal history of other infectious and parasitic diseases: Secondary | ICD-10-CM | POA: Insufficient documentation

## 2015-04-27 DIAGNOSIS — Z7951 Long term (current) use of inhaled steroids: Secondary | ICD-10-CM | POA: Insufficient documentation

## 2015-04-27 DIAGNOSIS — Z79899 Other long term (current) drug therapy: Secondary | ICD-10-CM | POA: Insufficient documentation

## 2015-04-27 DIAGNOSIS — I10 Essential (primary) hypertension: Secondary | ICD-10-CM | POA: Insufficient documentation

## 2015-04-27 DIAGNOSIS — F319 Bipolar disorder, unspecified: Secondary | ICD-10-CM | POA: Insufficient documentation

## 2015-04-27 MED ORDER — DEXAMETHASONE 4 MG PO TABS
12.0000 mg | ORAL_TABLET | Freq: Once | ORAL | Status: AC
  Administered 2015-04-27: 12 mg via ORAL
  Filled 2015-04-27: qty 3

## 2015-04-27 MED ORDER — AMOXICILLIN 500 MG PO CAPS
1000.0000 mg | ORAL_CAPSULE | Freq: Once | ORAL | Status: AC
  Administered 2015-04-27: 1000 mg via ORAL
  Filled 2015-04-27: qty 2

## 2015-04-27 MED ORDER — AMOXICILLIN 500 MG PO CAPS: 1000.0000 mg | ORAL_CAPSULE | Freq: Two times a day (BID) | ORAL | Status: DC

## 2015-04-27 NOTE — ED Notes (Signed)
Pt reports foreign body sensation in her throat that makes her feel like she is choking - pt has been seen for the same x3 in the ER. Pt handling secretions well, speaking complete sentences.

## 2015-04-27 NOTE — ED Provider Notes (Signed)
CSN: [id number]     Arrival date & time 04/27/15  0231 History  By signing my name below, I, Eustaquio Maize, attest that this documentation has been prepared under the direction and in the presence of Delora Fuel, MD. Electronically Signed: Eustaquio Maize, ED Scribe. 04/27/2015. 3:18 AM.   Chief Complaint  Patient presents with  . Choking   The history is provided by the patient. No language interpreter was used.     HPI Comments: Kathleen [name] is a 24 y.o. female who presents to the Emergency Department complaining of gradual onset, constant, choking sensation in throat x 1 week, gradually worsening. Pt has been seen in the ED 3 times in the past week for the same symptoms and was diagnosed with pharyngitis. She notes painful swallowing and that she hears a popping noise when she swallows. She also complains of productive cough with green phlegm. Denies fever, chills, or any other associated symptoms. Per chart review, pt had a throat culture done on 04/23/2015 (approximately 4 days ago) which came back positive for betahemolytic cultures, not group A.    Past Medical History  Diagnosis Date  . Depression   . Bipolar 1 disorder ([place])   . Headache(784.0)   . STD (female)   . Hypertension   . Schizophrenia [facility])    Past Surgical History  Procedure Laterality Date  . Bunionectomy      both feet   Family History  Problem Relation Age of Onset  . Diabetes Mother   . Hypertension Mother    Social History  Substance Use Topics  . Smoking status: Never Smoker   . Smokeless tobacco: None  . Alcohol Use: No   OB History    No data available     Review of Systems  Constitutional: Negative for fever and chills.  HENT: Positive for sore throat. Negative for trouble swallowing.        + Painful swallowing  Respiratory: Positive for cough and choking.   All other systems reviewed and are negative.  Allergies  Other  Home Medications   Prior to Admission medications    Medication Sig Start Date End Date Taking? Authorizing Provider  fexofenadine (ALLEGRA) 60 MG tablet Take 1 tablet (60 mg total) by mouth 2 (two) times daily. 04/24/15   Tatyana Kirichenko, PA-C  fluticasone (FLONASE) 50 MCG/ACT nasal spray Place 2 sprays into both nostrils daily. 04/24/15   Tatyana Kirichenko, PA-C  ibuprofen (ADVIL,MOTRIN) 600 MG tablet Take 1 tablet (600 mg total) by mouth every 6 (six) hours as needed. 04/24/15   Tatyana Kirichenko, PA-C  lidocaine (XYLOCAINE) 2 % solution Use as directed 20 mLs in the mouth or throat as needed for mouth pain. 04/23/15   Hanna Patel-Mills, PA-C  pseudoephedrine (SUDAFED) 30 MG tablet Take 1 tablet (30 mg total) by mouth every 4 (four) hours as needed for congestion. 04/24/15   Jeannett Senior, PA-C   Triage VItals: BP 131/94 mmHg  Pulse 98  Temp(Src) 98.2 F (36.8 C) (Oral)  Resp 20  SpO2 100%  LMP 03/31/2015 (Approximate)   Physical Exam  Constitutional: She is oriented to person, place, and time. She appears well-developed. No distress.  Obese  HENT:  Head: Normocephalic and atraumatic.  Minimal tonsillar hypertrophy  No difficulty with secretions Normal phonation  Eyes: Conjunctivae and EOM are normal. Pupils are equal, round, and reactive to light.  Neck: Normal range of motion. Neck supple. No JVD present.  Cardiovascular: Normal rate, regular rhythm and normal heart  sounds.   No murmur heard. Pulmonary/Chest: Effort normal and breath sounds normal. She has no wheezes. She has no rales. She exhibits no tenderness.  Abdominal: Soft. Bowel sounds are normal. She exhibits no distension and no mass. There is no tenderness.  Musculoskeletal: Normal range of motion. She exhibits no edema.  Lymphadenopathy:    She has no cervical adenopathy.  Neurological: She is alert and oriented to person, place, and time. No cranial nerve deficit. She exhibits normal muscle tone. Coordination normal.  Skin: Skin is warm and dry. No rash noted.   Psychiatric: She has a normal mood and affect. Her behavior is normal. Judgment and thought content normal.  Nursing note and vitals reviewed.   ED Course  Procedures (including critical care time)  DIAGNOSTIC STUDIES: Oxygen Saturation is 100% on RA, normal by my interpretation.    COORDINATION OF CARE: 3:11 AM-Discussed treatment plan which includes Decadron and Rx Amoxicillin with pt at bedside and pt agreed to plan.     MDM   Final diagnoses:  Strep pharyngitis    Persistent sore throat and cough which seems to be primarily from postnasal drainage. Old records are reviewed and she has 3 prior visits with similar complaints over the last week. ED visit on November 4 was treated with dexamethasone and ketorolac. On November 7, she was given additional ketorolac and a prescription for Viscous Xylocaine. Strep screen was negative on that day. On November 8, she was given prescription for Flonase and Allegra. Soft tissue neck x-ray was negative at that time. After this, culture from throat swab from November 7 came back showing beta-hemolytic strep non-group A. this apparently is what is causing her ongoing symptoms. She is given another dose of dexamethasone and is discharged with prescription for amoxicillin. Work release is given for 24 hours.  I personally performed the services described in this documentation, which was scribed in my presence. The recorded information has been reviewed and is accurate.       Delora Fuel, MD [id number]

## 2015-04-27 NOTE — Discharge Instructions (Signed)
Strep Throat Strep throat is a bacterial infection of the throat. Your health care provider may call the infection tonsillitis or pharyngitis, depending on whether there is swelling in the tonsils or at the back of the throat. Strep throat is most common during the cold months of the year in children who are 39-24 years of age, but it can happen during any season in people of any age. This infection is spread from person to person (contagious) through coughing, sneezing, or close contact. CAUSES Strep throat is caused by the bacteria called Streptococcus pyogenes. RISK FACTORS This condition is more likely to develop in:  People who spend time in crowded places where the infection can spread easily.  People who have close contact with someone who has strep throat. SYMPTOMS Symptoms of this condition include:  Fever or chills.   Redness, swelling, or pain in the tonsils or throat.  Pain or difficulty when swallowing.  White or yellow spots on the tonsils or throat.  Swollen, tender glands in the neck or under the jaw.  Red rash all over the body (rare). DIAGNOSIS This condition is diagnosed by performing a rapid strep test or by taking a swab of your throat (throat culture test). Results from a rapid strep test are usually ready in a few minutes, but throat culture test results are available after one or two days. TREATMENT This condition is treated with antibiotic medicine. HOME CARE INSTRUCTIONS Medicines  Take over-the-counter and prescription medicines only as told by your health care provider.  Take your antibiotic as told by your health care provider. Do not stop taking the antibiotic even if you start to feel better.  Have family members who also have a sore throat or fever tested for strep throat. They may need antibiotics if they have the strep infection. Eating and Drinking  Do not share food, drinking cups, or personal items that could cause the infection to spread to  other people.  If swallowing is difficult, try eating soft foods until your sore throat feels better.  Drink enough fluid to keep your urine clear or pale yellow. General Instructions  Gargle with a salt-water mixture 3-4 times per day or as needed. To make a salt-water mixture, completely dissolve -1 tsp of salt in 1 cup of warm water.  Make sure that all household members wash their hands well.  Get plenty of rest.  Stay home from school or work until you have been taking antibiotics for 24 hours.  Keep all follow-up visits as told by your health care provider. This is important. SEEK MEDICAL CARE IF:  The glands in your neck continue to get bigger.  You develop a rash, cough, or earache.  You cough up a thick liquid that is green, yellow-brown, or bloody.  You have pain or discomfort that does not get better with medicine.  Your problems seem to be getting worse rather than better.  You have a fever. SEEK IMMEDIATE MEDICAL CARE IF:  You have new symptoms, such as vomiting, severe headache, stiff or painful neck, chest pain, or shortness of breath.  You have severe throat pain, drooling, or changes in your voice.  You have swelling of the neck, or the skin on the neck becomes red and tender.  You have signs of dehydration, such as fatigue, dry mouth, and decreased urination.  You become increasingly sleepy, or you cannot wake up completely.  Your joints become red or painful.   This information is not intended to replace  advice given to you by your health care provider. Make sure you discuss any questions you have with your health care provider.   Document Released: 05/30/2000 Document Revised: 02/21/2015 Document Reviewed: 09/25/2014 Elsevier Interactive Patient Education 2016 Else[employer].  Amoxicillin capsules or tablets What is this medicine? AMOXICILLIN (a mox i SIL in) is a penicillin antibiotic. It is used to treat certain kinds of bacterial infections. It  will not work for colds, flu, or other viral infections. This medicine may be used for other purposes; ask your health care provider or pharmacist if you have questions. What should I tell my health care provider before I take this medicine? They need to know if you have any of these conditions: -asthma -kidney disease -an unusual or allergic reaction to amoxicillin, other penicillins, cephalosporin antibiotics, other medicines, foods, dyes, or preservatives -pregnant or trying to get pregnant -breast-feeding How should I use this medicine? Take this medicine by mouth with a glass of water. Follow the directions on your prescription label. You may take this medicine with food or on an empty stomach. Take your medicine at regular intervals. Do not take your medicine more often than directed. Take all of your medicine as directed even if you think your are better. Do not skip doses or stop your medicine early. Talk to your pediatrician regarding the use of this medicine in children. While this drug may be prescribed for selected conditions, precautions do apply. Overdosage: If you think you have taken too much of this medicine contact a poison control center or emergency room at once. NOTE: This medicine is only for you. Do not share this medicine with others. What if I miss a dose? If you miss a dose, take it as soon as you can. If it is almost time for your next dose, take only that dose. Do not take double or extra doses. What may interact with this medicine? -amiloride -birth control pills -chloramphenicol -macrolides -probenecid -sulfonamides -tetracyclines This list may not describe all possible interactions. Give your health care provider a list of all the medicines, herbs, non-prescription drugs, or dietary supplements you use. Also tell them if you smoke, drink alcohol, or use illegal drugs. Some items may interact with your medicine. What should I watch for while using this  medicine? Tell your doctor or health care professional if your symptoms do not improve in 2 or 3 days. Take all of the doses of your medicine as directed. Do not skip doses or stop your medicine early. If you are diabetic, you may get a false positive result for sugar in your urine with certain brands of urine tests. Check with your doctor. Do not treat diarrhea with over-the-counter products. Contact your doctor if you have diarrhea that lasts more than 2 days or if the diarrhea is severe and watery. What side effects may I notice from receiving this medicine? Side effects that you should report to your doctor or health care professional as soon as possible: -allergic reactions like skin rash, itching or hives, swelling of the face, lips, or tongue -breathing problems -dark urine -redness, blistering, peeling or loosening of the skin, including inside the mouth -seizures -severe or watery diarrhea -trouble passing urine or change in the amount of urine -unusual bleeding or bruising -unusually weak or tired -yellowing of the eyes or skin Side effects that usually do not require medical attention (report to your doctor or health care professional if they continue or are bothersome): -dizziness -headache -stomach upset -trouble sleeping  This list may not describe all possible side effects. Call your doctor for medical advice about side effects. You may report side effects to FDA at 1-800-FDA-1088. Where should I keep my medicine? Keep out of the reach of children. Store between 68 and 77 degrees F (20 and 25 degrees C). Keep bottle closed tightly. Throw away any unused medicine after the expiration date. NOTE: This sheet is a summary. It may not cover all possible information. If you have questions about this medicine, talk to your doctor, pharmacist, or health care provider.    2016, Elsevier/Gold Standard. (2007-08-24 14:10:59)

## 2015-05-01 ENCOUNTER — Emergency Department (HOSPITAL_COMMUNITY)
Admission: EM | Admit: 2015-05-01 | Discharge: 2015-05-01 | Disposition: A | Discharge status: Home / Self Care | Payer: Medicaid Other | Attending: Emergency Medicine | Admitting: Emergency Medicine

## 2015-05-01 ENCOUNTER — Emergency Department (HOSPITAL_COMMUNITY): Payer: Medicaid Other

## 2015-05-01 ENCOUNTER — Encounter (HOSPITAL_COMMUNITY): Payer: Self-pay | Admitting: Emergency Medicine

## 2015-05-01 DIAGNOSIS — Z8659 Personal history of other mental and behavioral disorders: Secondary | ICD-10-CM | POA: Insufficient documentation

## 2015-05-01 DIAGNOSIS — R000 Tachycardia, unspecified: Secondary | ICD-10-CM | POA: Insufficient documentation

## 2015-05-01 DIAGNOSIS — Z8619 Personal history of other infectious and parasitic diseases: Secondary | ICD-10-CM | POA: Insufficient documentation

## 2015-05-01 DIAGNOSIS — I10 Essential (primary) hypertension: Secondary | ICD-10-CM | POA: Insufficient documentation

## 2015-05-01 DIAGNOSIS — J069 Acute upper respiratory infection, unspecified: Secondary | ICD-10-CM

## 2015-05-01 MED ORDER — LORATADINE-PSEUDOEPHEDRINE ER 10-240 MG PO TB24: 1.0000 | ORAL_TABLET | Freq: Every day | ORAL | Status: DC

## 2015-05-01 MED ORDER — DEXTROMETHORPHAN POLISTIREX ER 30 MG/5ML PO SUER: 30.0000 mg | ORAL | Status: DC | PRN

## 2015-05-01 NOTE — ED Provider Notes (Signed)
CSN: [id number]     Arrival date & time 05/01/15  1936 History   First MD Initiated Contact with Patient 05/01/15 2010     Chief Complaint  Patient presents with  . Nasal Congestion  . Cough     (Consider location/radiation/quality/duration/timing/severity/associated sxs/prior Treatment) Patient is a 24 y.o. female presenting with cough. The history is provided by the patient. No language interpreter was used.  Cough Cough characteristics:  Productive Sputum characteristics:  Nondescript Severity:  Moderate Onset quality:  Gradual Duration:  10 days Timing:  Constant Progression:  Unchanged Chronicity:  New Smoker: no   Context: not animal exposure, not exposure to allergens, not fumes, not occupational exposure, not sick contacts, not smoke exposure, not upper respiratory infection, not weather changes and not with activity   Relieved by:  Nothing Worsened by:  Nothing tried Ineffective treatments:  Fluids, rest, beta-agonist inhaler and cough suppressants Associated symptoms: rhinorrhea and sore throat   Associated symptoms: no chest pain, no ear fullness, no ear pain, no eye discharge, no fever, no headaches and no rash   Risk factors: no chemical exposure, no recent infection and no recent travel     Past Medical History  Diagnosis Date  . Depression   . Bipolar 1 disorder ([place])   . Headache(784.0)   . STD (female)   . Hypertension   . Schizophrenia [facility])    Past Surgical History  Procedure Laterality Date  . Bunionectomy      both feet   Family History  Problem Relation Age of Onset  . Diabetes Mother   . Hypertension Mother    Social History  Substance Use Topics  . Smoking status: Never Smoker   . Smokeless tobacco: None  . Alcohol Use: No   OB History    No data available     Review of Systems  Constitutional: Negative for fever.  HENT: Positive for congestion, postnasal drip, rhinorrhea and sore throat. Negative for ear pain.   Eyes: Negative  for discharge.  Respiratory: Positive for cough.   Cardiovascular: Negative for chest pain.  Skin: Negative for rash.  Neurological: Negative for headaches.  All other systems reviewed and are negative.     Allergies  Other  Home Medications   Prior to Admission medications   Medication Sig Start Date End Date Taking? Authorizing Provider  Dextromethorphan-Guaifenesin (MUCINEX DM PO) Take 15 mLs by mouth daily as needed. For cough per patient   Yes Historical Provider, MD  amoxicillin (AMOXIL) 500 MG capsule Take 2 capsules (1,000 mg total) by mouth 2 (two) times daily. Patient not taking: Reported on 05/01/2015 [id number]   Delora Fuel, MD  fexofenadine (ALLEGRA) 60 MG tablet Take 1 tablet (60 mg total) by mouth 2 (two) times daily. Patient not taking: Reported on 05/01/2015 04/24/15   Tatyana Kirichenko, PA-C  fluticasone (FLONASE) 50 MCG/ACT nasal spray Place 2 sprays into both nostrils daily. Patient not taking: Reported on 05/01/2015 04/24/15   Tatyana Kirichenko, PA-C  ibuprofen (ADVIL,MOTRIN) 600 MG tablet Take 1 tablet (600 mg total) by mouth every 6 (six) hours as needed. Patient not taking: Reported on 05/01/2015 04/24/15   Tatyana Kirichenko, PA-C  lidocaine (XYLOCAINE) 2 % solution Use as directed 20 mLs in the mouth or throat as needed for mouth pain. Patient not taking: Reported on 05/01/2015 04/23/15   Ottie Glazier, PA-C  pseudoephedrine (SUDAFED) 30 MG tablet Take 1 tablet (30 mg total) by mouth every 4 (four) hours as needed for congestion. Patient not  taking: Reported on 05/01/2015 04/24/15   Tatyana Kirichenko, PA-C   BP 116/102 mmHg  Pulse 118  Temp(Src) 99.2 F (37.3 C) (Oral)  Resp 20  SpO2 96%  LMP 03/31/2015 (Approximate) Physical Exam  Constitutional: She is oriented to person, place, and time. She appears well-developed and well-nourished. No distress.  HENT:  Head: Normocephalic and atraumatic.  Mouth/Throat: Oropharynx is clear and moist. No  oropharyngeal exudate.  Eyes: Conjunctivae and EOM are normal.  Neck: Normal range of motion.  Cardiovascular: Normal rate and regular rhythm.  Exam reveals no gallop and no friction rub.   No murmur heard. Pulmonary/Chest: Effort normal and breath sounds normal. She has no wheezes. She has no rales. She exhibits no tenderness.  Abdominal: Soft. She exhibits no distension. There is no tenderness. There is no rebound.  Musculoskeletal: Normal range of motion.  Neurological: She is alert and oriented to person, place, and time. Coordination normal.  Speech is goal-oriented. Moves limbs without ataxia.   Skin: Skin is warm and dry.  Psychiatric: She has a normal mood and affect. Her behavior is normal.  Nursing note and vitals reviewed.   ED Course  Procedures (including critical care time) Labs Review Labs Reviewed - No data to display  Imaging Review Dg Chest 2 View  05/01/2015  CLINICAL DATA:  Acute onset of productive cough, congestion and sore throat. Initial encounter. EXAM: CHEST  2 VIEW COMPARISON:  Chest radiograph performed 06/23/2014 FINDINGS: The lungs are well-aerated and clear. There is no evidence of focal opacification, pleural effusion or pneumothorax. The heart is normal in size; the mediastinal contour is within normal limits. No acute osseous abnormalities are seen. IMPRESSION: No acute cardiopulmonary process seen. Electronically Signed   By: Garald Balding M.D.   On: 05/01/2015 20:26   I have personally reviewed and evaluated these images and lab results as part of my medical decision-making.   EKG Interpretation None      MDM   Final diagnoses:  Upper respiratory virus    8:37 PM Patient's chest xray unremarkable for acute changes. Patient is tachycardic. Patient likely has a viral illness and has been seen multiple times for the same symptoms. Patient will have Claritin-d and delsym for cough. No further evaluation needed at this time.     Alvina Chou, PA-C 05/01/15 2043  Wandra Arthurs, MD 05/01/15 2207

## 2015-05-01 NOTE — ED Notes (Signed)
Patient transported to X-ray 

## 2015-05-01 NOTE — Discharge Instructions (Signed)
Take Claritin D as directed. Take delsym as needed for cough. Refer to attached documents for more information.  °

## 2015-05-01 NOTE — ED Notes (Addendum)
Pt reports symptoms since the 3rd. Pt did have a sore throat but throat feels better. Pt now c.o. Post nasal drip and copious mucous with productive cough with green and white mucous. Pt states she wants a chest xray to make sure she "does not have pneumonia." Pt already had a strep screening done here.

## 2015-05-05 ENCOUNTER — Encounter (HOSPITAL_COMMUNITY): Payer: Self-pay | Admitting: Oncology

## 2015-05-05 ENCOUNTER — Emergency Department (HOSPITAL_COMMUNITY)
Admission: EM | Admit: 2015-05-05 | Discharge: 2015-05-05 | Disposition: A | Discharge status: Home / Self Care | Payer: Medicaid Other | Attending: Emergency Medicine | Admitting: Emergency Medicine

## 2015-05-05 DIAGNOSIS — K148 Other diseases of tongue: Secondary | ICD-10-CM | POA: Insufficient documentation

## 2015-05-05 DIAGNOSIS — I10 Essential (primary) hypertension: Secondary | ICD-10-CM | POA: Insufficient documentation

## 2015-05-05 DIAGNOSIS — R220 Localized swelling, mass and lump, head: Secondary | ICD-10-CM

## 2015-05-05 DIAGNOSIS — Z8619 Personal history of other infectious and parasitic diseases: Secondary | ICD-10-CM | POA: Insufficient documentation

## 2015-05-05 DIAGNOSIS — R059 Cough, unspecified: Secondary | ICD-10-CM

## 2015-05-05 DIAGNOSIS — Z8659 Personal history of other mental and behavioral disorders: Secondary | ICD-10-CM | POA: Insufficient documentation

## 2015-05-05 DIAGNOSIS — R05 Cough: Secondary | ICD-10-CM

## 2015-05-05 MED ORDER — PREDNISONE 20 MG PO TABS
60.0000 mg | ORAL_TABLET | Freq: Once | ORAL | Status: AC
  Administered 2015-05-05: 60 mg via ORAL
  Filled 2015-05-05: qty 3

## 2015-05-05 MED ORDER — DIPHENHYDRAMINE HCL 25 MG PO CAPS
50.0000 mg | ORAL_CAPSULE | Freq: Once | ORAL | Status: AC
  Administered 2015-05-05: 50 mg via ORAL
  Filled 2015-05-05: qty 2

## 2015-05-05 MED ORDER — PREDNISONE 10 MG PO TABS: 40.0000 mg | ORAL_TABLET | Freq: Every day | ORAL | Status: DC

## 2015-05-05 MED ORDER — FAMOTIDINE 20 MG PO TABS: 20.0000 mg | ORAL_TABLET | Freq: Two times a day (BID) | ORAL | Status: DC

## 2015-05-05 MED ORDER — BENZONATATE 100 MG PO CAPS
100.0000 mg | ORAL_CAPSULE | Freq: Once | ORAL | Status: AC
  Administered 2015-05-05: 100 mg via ORAL
  Filled 2015-05-05: qty 1

## 2015-05-05 MED ORDER — BENZONATATE 100 MG PO CAPS: 100.0000 mg | ORAL_CAPSULE | Freq: Three times a day (TID) | ORAL | Status: DC

## 2015-05-05 MED ORDER — FAMOTIDINE 20 MG PO TABS
40.0000 mg | ORAL_TABLET | Freq: Once | ORAL | Status: AC
  Administered 2015-05-05: 40 mg via ORAL
  Filled 2015-05-05: qty 2

## 2015-05-05 NOTE — Discharge Instructions (Signed)
You have been seen today for cough and a possible allergic reaction. Follow up with PCP as soon as possible. Return to ED should symptoms worsen. You may take Mucinex to aid with congestion, ibuprofen or Tylenol to help with pain or swelling, Benadryl to help with allergy symptoms (50 mg every 6 hours), and Tessalon or Robitussin for your cough. Pepcid and prednisone should help keep swelling down as well. If you have antibiotics left in your prescribed regimen, continue to take these until they're gone.   Emergency Department Resource Guide 1) Find a Doctor and Pay Out of Pocket Although you won't have to find out who is covered by your insurance plan, it is a good idea to ask around and get recommendations. You will then need to call the office and see if the doctor you have chosen will accept you as a new patient and what types of options they offer for patients who are self-pay. Some doctors offer discounts or will set up payment plans for their patients who do not have insurance, but you will need to ask so you aren't surprised when you get to your appointment.  2) Contact Your Local Health Department Not all health departments have doctors that can see patients for sick visits, but many do, so it is worth a call to see if yours does. If you don't know where your local health department is, you can check in your phone book. The CDC also has a tool to help you locate your state's health department, and many state websites also have listings of all of their local health departments.  3) Find a [facility] If your illness is not likely to be very severe or complicated, you may want to try a walk in clinic. These are popping up all over the country in pharmacies, drugstores, and shopping centers. They're usually staffed by nurse practitioners or physician assistants that have been trained to treat common illnesses and complaints. They're usually fairly quick and inexpensive. However, if you have  serious medical issues or chronic medical problems, these are probably not your best option.  No Primary Care Doctor: - Call Health Connect at  [phone] - they can help you locate a primary care doctor that  accepts your insurance, provides certain services, etc. - Physician Referral Service- [phone]  Chronic Pain Problems: Organization         Address  Phone   Notes  [facility]  [phone] Patients need to be referred by their primary care doctor.   Medication Assistance: Organization         Address  Phone   Notes  [facility] Medication [facility] [address] [place], [place] [address] [phone] --Must be a resident of [facility] -- Must have NO insurance coverage whatsoever (no Medicaid/ Medicare, etc.) -- The pt. MUST have a primary care doctor that directs their care regularly and follows them in the community   MedAssist  [phone]   [employer]  [phone]    Agencies that provide inexpensive medical care: Organization         Address  Phone   Notes  [place]  [phone]   [facility]    [phone]   [facility] [place], [place] [address] [phone]   [place] [address], [place] [phone]   Planned Parenthood    [phone])  [facility]    [phone]   [address], [place] Phone:  [phone], Fax:  [phone] Hours of Operation:  9 am - 6 pm, M-F.  Also accepts Medicaid/Medicare and self-pay.  [facility] [place], [address], [place] Phone: [phone], Fax: [phone]. Hours of Operation:  8:30 am - 5:30 pm, M-F.  Also accepts Medicaid and self-pay.  [facility] [place] [address], [place] Phone: [phone]   [place],  [place], [place] [phone], Ext. [phone] Mondays & Thursdays: 7-9 AM.  First 15 patients are seen on a first come, first serve basis.    [place] Providers:  Organization         Address  Phone   Notes  [facility] [address], [place] [phone] Also accepts self-pay patients.  [facility] [address], [place]  [phone]   [place], [address], [place] [phone]   [facility] [address] [place] [phone]   Lucianne Lei [address], [place]   [phone] Only accepts [place] Access [place] patients after they have their name applied to their card.   Self-Pay (no insurance) in [facility]:  Organization         Address  Phone   Notes  Sickle Cell Patients, [facility] [place] [phone]   [facility] [place] [phone]   [facility] [place]  [place], [place], [place] [phone]   [facility]/Dr. Osei-Bonsu  [address] [place] or [address], [place] [phone] Phone number for both [place] and [place] locations is the same.  [facility] [address] [place] [phone]   [facility] [address], [place] or [address] [phone]   [facility] [address], [place] [phone], phone; [phone], fax Sees patients 1st and 3rd Saturday of every month.  Must not qualify for public or private insurance (i.e. Medicaid, Medicare, [place] Health Choice, Veterans' Benefits)  Household income should be no more than 200% of the poverty level The clinic cannot treat you if you are pregnant or think you are pregnant   Sexually transmitted diseases are not treated at the clinic.    Dental Care: Organization         Address  Phone  Notes  [facility] Department of [facility] [place] [phone] Accepts children up to age 23 who are enrolled in [place] or [place]; pregnant women with a Medicaid card; and children who have applied for Medicaid or [place] Health Choice, but were declined, whose parents can pay a reduced fee at time of service.  [facility] Department of [facility]  [address], [place] [phone] Accepts children up to age 69 who are enrolled in [place] or [place]; pregnant women with a Medicaid card; and children who have applied for Medicaid or [place], but were declined, whose parents can pay a reduced  fee at time of service.  [facility] Adult [facility] Access PROGRAM  [place] [phone] Patients are seen by appointment only. Walk-ins are not accepted. [place] will see patients 48 years of age and older. Monday - Tuesday (8am-5pm) Most Wednesdays (8:30-5pm) $30 per visit, cash only  [facility] Adult Dental Access PROGRAM  [address], [facility] [phone] Patients are seen by appointment only. Walk-ins are not accepted. [place] will see patients 49 years of age and older. One Wednesday Evening (Monthly: Volunteer Based).  $30 per visit, cash only  [place]  [phone] for adults; Children under age 70, call Graduate Pediatric Dentistry at [phone]. Children aged 72-14, please call [phone] to request a pediatric application.  Dental services are provided in all areas of dental care including fillings, crowns and bridges, complete and partial dentures, implants, gum treatment, root canals, and extractions. Preventive care is also provided. Treatment is provided to both adults and children. Patients  are selected via a lottery and there is often a waiting list.   [facility] [address], [place]  [phone] [email or web address]   [facility] [address] [place], [place] [phone], Ext. [phone] Second and Fourth Thursday of each month, opens at 6:30 AM; Clinic ends at 9 AM.  Patients are seen on a first-come first-served basis, and a limited number are seen during each clinic.   [facility]  [address] [place], [place] [phone]   Eligibility Requirements You must have lived in [place], [place], or [place] counties for at least the last three months.   You cannot be eligible for state or federal sponsored [employer], including [employer], [place], or [employer].   You generally cannot be eligible for healthcare insurance through your employer.    How to apply: Eligibility screenings are held every Tuesday and Wednesday afternoon from 1:00 pm until 4:00 pm. You do not need an appointment for the interview!  [facility] [address], [place], [place]   [place]  [place] Department  [place]  [phone]    Behavioral Health Resources in the Community: Intensive Outpatient Programs Organization         Address  Phone  Notes  [place]. [address] [place], [place] [phone]   [facility] [address], [place], [place]   ADS: Alcohol & Drug Svcs [address] [place], [place]   [place] [address]  [place], [place] or [phone]   Substance Abuse Resources Organization         Address  Phone  Notes  Alcohol and Drug Services  [phone]   [place]  [phone]   [facility] [place]   Chinita Pester  [phone]    Residential & Outpatient Substance Abuse Program  [phone]   Psychological Services Organization         Address  Phone  Notes  [facility]  [place]  [phone]   [place] [address], [place] or [phone]    Mobile Crisis Teams Organization         Address  Phone  Notes  [facility], Mobile Crisis Care Unit  [phone]  [facility]  [address] [place], [place]   [facility] [address] [place] [phone]    Self-Help/Support Groups Organization         Address  Phone             Notes  Mental Health Assoc. of [place] - variety of support groups  [place] Call for more information  Narcotics Anonymous (NA), [facility] [address], [employer] [place]  2 meetings at this location   Special educational needs teacher         Address  Phone  Notes  [facility] Residential Treatment [place],    [place]  [phone]   [facility]  [address], [place] [id number], [place], [place]   [place], [place] [phone] Admissions: 8am-3pm M-F  Incentives Substance [place] [address]    [place], [place] [phone]   [facility] [address] [place], [place], [place]   [facility] [address]  [place] [address], [place]   [facility] Programs - Intensive Outpatient [address], [place], [place]   [facility] [place].,  [place], [place] [phone] or [phone]   [facility] (RTS) [address], [place], [place] Accepts Medicaid  [facility] [place] [address],  [place] [phone] Substance Abuse/Addiction Treatment   [facility] Organization         Address  Phone  Notes  [facility]  [phone]   Domenic Schwab, PhD [address] Arlis [place], [place]   [phone] or [phone]   [place] [place], [place] [phone]   [facility] [address] [place], [place] [phone] Insurance/Medicaid/sponsorship through [facility] and Families [address], [address] [place]                                    [place], [place] [phone] [place] [address] [place] [address], [place] [phone]    Dr. Adele Schilder  [phone]   [facility] Dept. 1) [address] [place] 2) [place] 3)  [place] [address], [place] [phone]  [phone]   [place] [phone] or [phone] (After Hours)

## 2015-05-05 NOTE — ED Provider Notes (Signed)
CSN: [id number]     Arrival date & time 05/05/15  2208 History   First MD Initiated Contact with Patient 05/05/15 2220     Chief Complaint  Patient presents with  . Cough     (Consider location/radiation/quality/duration/timing/severity/associated sxs/prior Treatment) HPI   Kathleen [name] is a 24 y.o. female, with a history of depression, bipolar, HTN, and Schizophrenia, presenting to the ED with productive cough with green sputum for the last week. Pt was at work tonight, began to cough, and was told to go to the ED. Pt states, "I feel like I'm choking on my phlegm." Pt denies shortness of breath, chest pain, N/V, trouble swallowing, fever/chills, or any other complaints. Pt states she is still eating and drinking normally. Pt states the coughing is worse when she lays down. Pt has been previously seen for this same complaint multiple times in the last week and treated with a combination of OTC and prescription medications, with a different therapy after each visit. Pt states, "I wouldn't actually have come in if it weren't for my work sending me here."    Past Medical History  Diagnosis Date  . Depression   . Bipolar 1 disorder ([place])   . Headache(784.0)   . STD (female)   . Hypertension   . Schizophrenia [facility])    Past Surgical History  Procedure Laterality Date  . Bunionectomy      both feet   Family History  Problem Relation Age of Onset  . Diabetes Mother   . Hypertension Mother    Social History  Substance Use Topics  . Smoking status: Never Smoker   . Smokeless tobacco: None  . Alcohol Use: No   OB History    No data available     Review of Systems  Constitutional: Negative for fever, chills and diaphoresis.  Respiratory: Positive for cough. Negative for shortness of breath.   Cardiovascular: Negative for chest pain.  Gastrointestinal: Negative for nausea, vomiting and abdominal pain.  All other systems reviewed and are negative.     Allergies   Other  Home Medications   Prior to Admission medications   Medication Sig Start Date End Date Taking? Authorizing Provider  guaiFENesin (ROBITUSSIN) 100 MG/5ML SOLN Take 5 mLs by mouth every 4 (four) hours as needed for cough or to loosen phlegm.   Yes Historical Provider, MD  benzonatate (TESSALON) 100 MG capsule Take 1 capsule (100 mg total) by mouth every 8 (eight) hours. 05/05/15   Shawn C Joy, PA-C  Dextromethorphan-Guaifenesin (MUCINEX DM PO) Take 15 mLs by mouth daily as needed. For cough per patient    Historical Provider, MD  famotidine (PEPCID) 20 MG tablet Take 1 tablet (20 mg total) by mouth 2 (two) times daily. 05/05/15   Shawn C Joy, PA-C  predniSONE (DELTASONE) 10 MG tablet Take 4 tablets (40 mg total) by mouth daily. 05/05/15   Shawn C Joy, PA-C   BP 148/93 mmHg  Pulse 98  Temp(Src) 98.4 F (36.9 C) (Oral)  Resp 20  Ht 5\' 9"  (1.753 m)  Wt 241 lb (109.317 kg)  BMI 35.57 kg/m2  SpO2 100%  LMP 03/31/2015 (Approximate) Physical Exam  Constitutional: She is oriented to person, place, and time. She appears well-developed and well-nourished. No distress.  HENT:  Head: Normocephalic and atraumatic.  Mouth/Throat: Uvula is midline, oropharynx is clear and moist and mucous membranes are normal.  Eyes: Conjunctivae are normal. Pupils are equal, round, and reactive to light.  Neck: Normal range  of motion. Neck supple.  No throat tenderness.  Cardiovascular: Normal rate, regular rhythm and normal heart sounds.   Pulmonary/Chest: Effort normal and breath sounds normal. No accessory muscle usage. No tachypnea. No respiratory distress. She exhibits no tenderness.  Abdominal: Soft. Bowel sounds are normal.  Musculoskeletal: She exhibits no edema or tenderness.  Lymphadenopathy:    She has no cervical adenopathy.  Neurological: She is alert and oriented to person, place, and time.  Skin: Skin is warm and dry. She is not diaphoretic.  Nursing note and vitals reviewed.   ED  Course  Procedures (including critical care time) Labs Review Labs Reviewed - No data to display  Imaging Review No results found. I have personally reviewed and evaluated these images and lab results as part of my medical decision-making.   EKG Interpretation None      MDM   Final diagnoses:  Cough  Mild tongue swelling    Kathleen [name] presents for productive cough for over a week.  Findings and plan of care discussed with Alfonzo Beers, MD.  Pt has been treated in many different ways for the same complaint, but has not yet been treated for an allergic reaction. This possibility is less likely than other possibilities, but pt has been treated for all the more likely options. The decision to treat pt for allergic reaction was supported by patient's statement, "It feels like my throat is getting smaller and my tongue is swelling." These things were not observed on physical exam, but may just be subjective sensations.  Previous treatments patient that have been prescribed on her many visits to the ED for the same complaint are as follows: Flonase, Allegra, dexamethasone, ketorolac, Claritin, viscous lidocaine, and amoxicillin. Patient had a chest x-ray on 11/15, which was clear from abnormalities, and a throat xray on 11/8, which was also negative. The details of these previous treatments are shown below. 11:12 PM was reevaluated and states that she feels much better. Her cough has subsided and the sensation of tongue and throat swelling has resolved. Patient expresses a desire for discharge. Patient was given instructions for home medication use. Patient voiced understanding of these instructions. Patient confirms that she will have a ride home from the ED and will not be driving.   MDM from 05/01/15 by PA [facility] when pt was seen for identical complaints as those she expresses today: "Patient's chest xray unremarkable for acute changes. Patient is tachycardic. Patient likely  has a viral illness and has been seen multiple times for the same symptoms. Patient will have Claritin-d and delsym for cough. No further evaluation needed at this time."  MDM from [id number] by Dr. Roxanne Mins when pt was seen for identical complaints as those she expresses today: "Persistent sore throat and cough which seems to be primarily from postnasal drainage. Old records are reviewed and she has 3 prior visits with similar complaints over the last week. ED visit on November 4 was treated with dexamethasone and ketorolac. On November 7, she was given additional ketorolac and a prescription for Viscous Xylocaine. Strep screen was negative on that day. On November 8, she was given prescription for Flonase and Allegra. Soft tissue neck x-ray was negative at that time. After this, culture from throat swab from November 7 came back showing beta-hemolytic strep non-group A. this apparently is what is causing her ongoing symptoms. She is given another dose of dexamethasone and is discharged with prescription for amoxicillin. Work release is given for 24 hours."  Lorayne Bender, PA-C 05/05/15 [place], MD 05/05/15 2322

## 2015-05-05 NOTE — ED Notes (Signed)
Pt presents d/t cough.  Pt states she was coughing and her face turned red and she coughed up phlegm.  Pt is speaking rapidly in complete sentences. RR even and unlabored.  Pt did not take any OTC medication PTA.

## 2015-06-16 ENCOUNTER — Emergency Department (HOSPITAL_COMMUNITY): Payer: Medicaid Other

## 2015-06-16 ENCOUNTER — Encounter (HOSPITAL_COMMUNITY): Payer: Self-pay | Admitting: Emergency Medicine

## 2015-06-16 ENCOUNTER — Emergency Department (HOSPITAL_COMMUNITY)
Admission: EM | Admit: 2015-06-16 | Discharge: 2015-06-16 | Disposition: A | Discharge status: Home / Self Care | Payer: Medicaid Other | Attending: Emergency Medicine | Admitting: Emergency Medicine

## 2015-06-16 DIAGNOSIS — R112 Nausea with vomiting, unspecified: Secondary | ICD-10-CM | POA: Insufficient documentation

## 2015-06-16 DIAGNOSIS — Z8619 Personal history of other infectious and parasitic diseases: Secondary | ICD-10-CM | POA: Insufficient documentation

## 2015-06-16 DIAGNOSIS — I10 Essential (primary) hypertension: Secondary | ICD-10-CM | POA: Insufficient documentation

## 2015-06-16 DIAGNOSIS — Z79899 Other long term (current) drug therapy: Secondary | ICD-10-CM | POA: Insufficient documentation

## 2015-06-16 DIAGNOSIS — R059 Cough, unspecified: Secondary | ICD-10-CM

## 2015-06-16 DIAGNOSIS — R05 Cough: Secondary | ICD-10-CM | POA: Insufficient documentation

## 2015-06-16 DIAGNOSIS — Z8659 Personal history of other mental and behavioral disorders: Secondary | ICD-10-CM | POA: Insufficient documentation

## 2015-06-16 DIAGNOSIS — R0789 Other chest pain: Secondary | ICD-10-CM

## 2015-06-16 DIAGNOSIS — R110 Nausea: Secondary | ICD-10-CM

## 2015-06-16 LAB — CBC
HEMATOCRIT: 37.1 % (ref 36.0–46.0)
HEMOGLOBIN: 11.7 g/dL — AB (ref 12.0–15.0)
MCH: 25.7 pg — ABNORMAL LOW (ref 26.0–34.0)
MCHC: 31.5 g/dL (ref 30.0–36.0)
MCV: 81.4 fL (ref 78.0–100.0)
Platelets: 245 10*3/uL (ref 150–400)
RBC: 4.56 MIL/uL (ref 3.87–5.11)
RDW: 12.7 % (ref 11.5–15.5)
WBC: 7.7 10*3/uL (ref 4.0–10.5)

## 2015-06-16 LAB — BASIC METABOLIC PANEL
Anion gap: 9 (ref 5–15)
BUN: 10 mg/dL (ref 6–20)
CHLORIDE: 101 mmol/L (ref 101–111)
CO2: 28 mmol/L (ref 22–32)
Calcium: 8.9 mg/dL (ref 8.9–10.3)
Creatinine, Ser: 1.1 mg/dL — ABNORMAL HIGH (ref 0.44–1.00)
GFR calc Af Amer: >60 mL/min (ref >60)
GFR calc non Af Amer: >60 mL/min (ref >60)
Glucose, Bld: 105 mg/dL — ABNORMAL HIGH (ref 65–99)
POTASSIUM: 3.6 mmol/L (ref 3.5–5.1)
Sodium: 138 mmol/L (ref 135–145)

## 2015-06-16 LAB — I-STAT TROPONIN, ED: Troponin i, poc: 0 ng/mL (ref 0.00–0.08)

## 2015-06-16 MED ORDER — HYDROCODONE-ACETAMINOPHEN 5-325 MG PO TABS
1.0000 | ORAL_TABLET | Freq: Once | ORAL | Status: AC
  Administered 2015-06-16: 1 via ORAL
  Filled 2015-06-16: qty 1

## 2015-06-16 MED ORDER — ONDANSETRON 4 MG PO TBDP
8.0000 mg | ORAL_TABLET | Freq: Once | ORAL | Status: AC
  Administered 2015-06-16: 8 mg via ORAL
  Filled 2015-06-16: qty 2

## 2015-06-16 MED ORDER — GUAIFENESIN 100 MG/5ML PO SOLN
10.0000 mL | Freq: Once | ORAL | Status: AC
  Administered 2015-06-16: 200 mg via ORAL
  Filled 2015-06-16: qty 10

## 2015-06-16 NOTE — ED Notes (Signed)
Patient tolerating oral fluids at bedside.  Patient given water and Ginger Ale to drink.

## 2015-06-16 NOTE — ED Notes (Signed)
Patient states intermittent chest pain x 2 days.  Patient states "I have been throwing up a bunch of phlegm, but I feel nauseated a lot".   Patient states chest pain resolved right now, but "sometimes I feel it a little".

## 2015-06-16 NOTE — Discharge Instructions (Signed)
It was our pleasure to provide your ER care today - we hope that you feel better.  Rest. Drink plenty of fluids.   Take tylenol/advil as need.  May try mucinex as need for symptom relief if cough and congestion.  Follow up with primary care doctor in the next couple days for recheck if symptoms fail to improve/resolve.  Return to ER if worse, difficulty breathing, medical emergency, other concern.     Chest Wall Pain Chest wall pain is pain in or around the bones and muscles of your chest. Sometimes, an injury causes this pain. Sometimes, the cause may not be known. This pain may take several weeks or longer to get better. HOME CARE INSTRUCTIONS  Pay attention to any changes in your symptoms. Take these actions to help with your pain:   Rest as told by your health care provider.   Avoid activities that cause pain. These include any activities that use your chest muscles or your abdominal and side muscles to lift heavy items.   If directed, apply ice to the painful area:  Put ice in a plastic bag.  Place a towel between your skin and the bag.  Leave the ice on for 20 minutes, 2-3 times per day.  Take over-the-counter and prescription medicines only as told by your health care provider.  Do not use tobacco products, including cigarettes, chewing tobacco, and e-cigarettes. If you need help quitting, ask your health care provider.  Keep all follow-up visits as told by your health care provider. This is important. SEEK MEDICAL CARE IF:  You have a fever.  Your chest pain becomes worse.  You have new symptoms. SEEK IMMEDIATE MEDICAL CARE IF:  You have nausea or vomiting.  You feel sweaty or light-headed.  You have a cough with phlegm (sputum) or you cough up blood.  You develop shortness of breath.   This information is not intended to replace advice given to you by your health care provider. Make sure you discuss any questions you have with your health care  provider.   Document Released: 06/02/2005 Document Revised: 02/21/2015 Document Reviewed: 08/28/2014 Elsevier Interactive Patient Education 2016 Else[employer] Inc[employer]     Upper Respiratory Infection, Adult Most upper respiratory infections (URIs) are a viral infection of the air passages leading to the lungs. A URI affects the nose, throat, and upper air passages. The most common type of URI is nasopharyngitis and is typically referred to as "the common cold." URIs run their course and usually go away on their own. Most of the time, a URI does not require medical attention, but sometimes a bacterial infection in the upper airways can follow a viral infection. This is called a secondary infection. Sinus and middle ear infections are common types of secondary upper respiratory infections. Bacterial pneumonia can also complicate a URI. A URI can worsen asthma and chronic obstructive pulmonary disease (COPD). Sometimes, these complications can require emergency medical care and may be life threatening.  CAUSES Almost all URIs are caused by viruses. A virus is a type of germ and can spread from one person to another.  RISKS FACTORS You may be at risk for a URI if:   You smoke.   You have chronic heart or lung disease.  You have a weakened defense (immune) system.   You are very young or very old.   You have nasal allergies or asthma.  You work in crowded or poorly ventilated areas.  You work in health care facilities or  schools. SIGNS AND SYMPTOMS  Symptoms typically develop 2-3 days after you come in contact with a cold virus. Most viral URIs last 7-10 days. However, viral URIs from the influenza virus (flu virus) can last 14-18 days and are typically more severe. Symptoms may include:   Runny or stuffy (congested) nose.   Sneezing.   Cough.   Sore throat.   Headache.   Fatigue.   Fever.   Loss of appetite.   Pain in your forehead, behind your eyes, and over your  cheekbones (sinus pain).  Muscle aches.  DIAGNOSIS  Your health care provider may diagnose a URI by:  Physical exam.  Tests to check that your symptoms are not due to another condition such as:  Strep throat.  Sinusitis.  Pneumonia.  Asthma. TREATMENT  A URI goes away on its own with time. It cannot be cured with medicines, but medicines may be prescribed or recommended to relieve symptoms. Medicines may help:  Reduce your fever.  Reduce your cough.  Relieve nasal congestion. HOME CARE INSTRUCTIONS   Take medicines only as directed by your health care provider.   Gargle warm saltwater or take cough drops to comfort your throat as directed by your health care provider.  Use a warm mist humidifier or inhale steam from a shower to increase air moisture. This may make it easier to breathe.  Drink enough fluid to keep your urine clear or pale yellow.   Eat soups and other clear broths and maintain good nutrition.   Rest as needed.   Return to work when your temperature has returned to normal or as your health care provider advises. You may need to stay home longer to avoid infecting others. You can also use a face mask and careful hand washing to prevent spread of the virus.  Increase the usage of your inhaler if you have asthma.   Do not use any tobacco products, including cigarettes, chewing tobacco, or electronic cigarettes. If you need help quitting, ask your health care provider. PREVENTION  The best way to protect yourself from getting a cold is to practice good hygiene.   Avoid oral or hand contact with people with cold symptoms.   Wash your hands often if contact occurs.  There is no clear evidence that vitamin C, vitamin E, echinacea, or exercise reduces the chance of developing a cold. However, it is always recommended to get plenty of rest, exercise, and practice good nutrition.  SEEK MEDICAL CARE IF:   You are getting worse rather than better.    Your symptoms are not controlled by medicine.   You have chills.  You have worsening shortness of breath.  You have brown or red mucus.  You have yellow or brown nasal discharge.  You have pain in your face, especially when you bend forward.  You have a fever.  You have swollen neck glands.  You have pain while swallowing.  You have white areas in the back of your throat. SEEK IMMEDIATE MEDICAL CARE IF:   You have severe or persistent:  Headache.  Ear pain.  Sinus pain.  Chest pain.  You have chronic lung disease and any of the following:  Wheezing.  Prolonged cough.  Coughing up blood.  A change in your usual mucus.  You have a stiff neck.  You have changes in your:  Vision.  Hearing.  Thinking.  Mood. MAKE SURE YOU:   Understand these instructions.  Will watch your condition.  Will get help right  away if you are not doing well or get worse.   This information is not intended to replace advice given to you by your health care provider. Make sure you discuss any questions you have with your health care provider.   Document Released: 11/26/2000 Document Revised: 10/17/2014 Document Reviewed: 09/07/2013 Elsevier Interactive Patient Education [employer].

## 2015-06-16 NOTE — ED Provider Notes (Signed)
CSN: [id number]     Arrival date & time 06/16/15  [id number] History   First MD Initiated Contact with Patient 06/16/15 [phone]     Chief Complaint  Patient presents with  . Chest Pain  . Nausea  . Emesis     (Consider location/radiation/quality/duration/timing/severity/associated sxs/prior Treatment) Patient is a 24 y.o. female presenting with chest pain and vomiting. The history is provided by the patient.  Chest Pain Associated symptoms: cough, headache and vomiting   Associated symptoms: no abdominal pain, no back pain, no fever, no numbness, no shortness of breath and no weakness   Emesis Associated symptoms: headaches   Associated symptoms: no abdominal pain, no chills and no sore throat   Patient c/o non productive cough, frontal headache, and chest pain since yesterday.  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  States chest hurts with lifting boxes, also worse w cough.  Pain constant since yesterday. Non radiating. No associated sob,or diaphoresis.  Pt did vomit x 1 after coughing spell, mild nausea. No pleuritic pain. No leg pain or swelling. Non smoker. No recent surgery, immobility, trauma or travel.  Pt denies exertional cp. Pt w cough, persistent, episodic. No sore throat. Mild nasal congestion. No purulent nasal drainage or sinus pain. Frontal headache in past day, gradual onset, mild, dull. No eye pain or change in vision. No neck pain or stiffness. No fever or chills. Pt denies nv. No abd pain. No dysuria or gu c/o.       Past Medical History  Diagnosis Date  . Depression   . Bipolar 1 disorder ([place])   . Headache(784.0)   . STD (female)   . Hypertension   . Schizophrenia [facility])    Past Surgical History  Procedure Laterality Date  . Bunionectomy      both feet   Family History  Problem Relation Age of Onset  . Diabetes Mother   . Hypertension Mother    Social History  Substance Use Topics  . Smoking status:  Never Smoker   . Smokeless tobacco: None  . Alcohol Use: No   OB History    No data available     Review of Systems  Constitutional: Negative for fever and chills.  HENT: Negative for sore throat.   Eyes: Negative for redness and visual disturbance.  Respiratory: Positive for cough. Negative for shortness of breath.   Cardiovascular: Positive for chest pain.  Gastrointestinal: Positive for vomiting. Negative for abdominal pain.  Genitourinary: Negative for dysuria and flank pain.  Musculoskeletal: Negative for back pain, neck pain and neck stiffness.  Skin: Negative for rash.  Neurological: Positive for headaches. Negative for weakness and numbness.  Hematological: Does not bruise/bleed easily.  Psychiatric/Behavioral: Negative for confusion.      Allergies  Other  Home Medications   Prior to Admission medications   Medication Sig Start Date End Date Taking? Authorizing Provider  Dextromethorphan-Guaifenesin (MUCINEX DM PO) Take 15 mLs by mouth daily as needed. For cough per patient   Yes Historical Provider, MD  famotidine (PEPCID) 20 MG tablet Take 1 tablet (20 mg total) by mouth 2 (two) times daily. 05/05/15  Yes Shawn C Joy, PA-C  guaiFENesin (ROBITUSSIN) 100 MG/5ML SOLN Take 5 mLs by mouth every 4 (four) hours as needed for cough or to loosen phlegm.   Yes Historical Provider, MD  predniSONE (DELTASONE) 10 MG tablet Take 4 tablets (40 mg total) by mouth daily. 05/05/15  Shawn C Joy, PA-C   BP 131/97 mmHg  Pulse 89  Temp(Src) 98.1 F (36.7 C) (Oral)  Resp 21  SpO2 100%  LMP 06/13/2015 Physical Exam  Constitutional: She is oriented to person, place, and time. She appears well-developed and well-nourished. No distress.  HENT:  Head: Atraumatic.  Nose: Nose normal.  Mouth/Throat: Oropharynx is clear and moist.  No sinus or temporal tenderness.   Eyes: Conjunctivae and EOM are normal. Pupils are equal, round, and reactive to light. No scleral icterus.  Neck: Neck  supple. No tracheal deviation present.  No stiffness or rigidity  Cardiovascular: Normal rate, regular rhythm, normal heart sounds and intact distal pulses.  Exam reveals no gallop and no friction rub.   No murmur heard. Pulmonary/Chest: Effort normal and breath sounds normal. No respiratory distress. She exhibits tenderness.  Abdominal: Soft. Normal appearance and bowel sounds are normal. She exhibits no distension. There is no tenderness.  Musculoskeletal: She exhibits no edema or tenderness.  Neurological: She is alert and oriented to person, place, and time.  Motor intact bil, stre 5/5. Ambulates w steady gait.   Skin: Skin is warm and dry. No rash noted. She is not diaphoretic.  Psychiatric: She has a normal mood and affect.  Nursing note and vitals reviewed.   ED Course  Procedures (including critical care time) Labs Review   Results for orders placed or performed during the hospital encounter of [id number]  Basic metabolic panel  Result Value Ref Range   Sodium 138 135 - 145 mmol/L   Potassium 3.6 3.5 - 5.1 mmol/L   Chloride 101 101 - 111 mmol/L   CO2 28 22 - 32 mmol/L   Glucose, Bld 105 (H) 65 - 99 mg/dL   BUN 10 6 - 20 mg/dL   Creatinine, Ser 1.10 (H) 0.44 - 1.00 mg/dL   Calcium 8.9 8.9 - 10.3 mg/dL   GFR calc non Af Amer >60 >60 mL/min   GFR calc Af Amer >60 >60 mL/min   Anion gap 9 5 - 15  CBC  Result Value Ref Range   WBC 7.7 4.0 - 10.5 K/uL   RBC 4.56 3.87 - 5.11 MIL/uL   Hemoglobin 11.7 (L) 12.0 - 15.0 g/dL   HCT 37.1 36.0 - 46.0 %   MCV 81.4 78.0 - 100.0 fL   MCH 25.7 (L) 26.0 - 34.0 pg   MCHC 31.5 30.0 - 36.0 g/dL   RDW 12.7 11.5 - 15.5 %   Platelets 245 150 - 400 K/uL  I-stat troponin, ED (not at [place], [facility])  Result Value Ref Range   Troponin i, poc 0.00 0.00 - 0.08 ng/mL   Comment 3           Dg Chest 2 View  06/16/2015  CLINICAL DATA:  Initial encounter for chest pain with nausea, blurry vision, and headache for 4 days. Chest pains have been  worsening since yesterday. EXAM: CHEST  2 VIEW COMPARISON:  05/01/2015 FINDINGS: The heart size and mediastinal contours are within normal limits. Both lungs are clear. The visualized skeletal structures are unremarkable. IMPRESSION: No active cardiopulmonary disease. Electronically Signed   By: Misty Stanley M.D.   On: 06/16/2015 10:07     I have personally reviewed and evaluated these images and lab results as part of my medical decision-making.   EKG Interpretation   Date/Time:  Saturday June 16 2015 09:24:03 EST Ventricular Rate:  89 PR Interval:  135 QRS Duration: 92 QT Interval:  369 QTC Calculation:  449 R Axis:   63 Text Interpretation:  Sinus rhythm Nonspecific T wave abnormality  Borderline repolarization abnormality Confirmed by Ashok Cordia  MD, Lennette Bihari  ([id number]) on 06/16/2015 9:34:53 AM      MDM   Labs. Cxr.  Reviewed nursing notes and prior charts for additional history.   Pt indicates has ride, does not have to drive.  Hydrocodone 1 po. zofran po, robitussin po.  No recurrent nv.  No increased wob or sob.  Pt currently appears stable for d/c.  rec close pcp f/u.  Return precautions provided.       Lajean Saver, MD 06/16/15 [phone]

## 2015-08-11 ENCOUNTER — Emergency Department (HOSPITAL_COMMUNITY)
Admission: EM | Admit: 2015-08-11 | Discharge: 2015-08-11 | Disposition: A | Discharge status: Home / Self Care | Payer: Medicaid Other | Attending: Emergency Medicine | Admitting: Emergency Medicine

## 2015-08-11 ENCOUNTER — Encounter (HOSPITAL_COMMUNITY): Payer: Self-pay | Admitting: Emergency Medicine

## 2015-08-11 DIAGNOSIS — I10 Essential (primary) hypertension: Secondary | ICD-10-CM | POA: Insufficient documentation

## 2015-08-11 DIAGNOSIS — L0291 Cutaneous abscess, unspecified: Secondary | ICD-10-CM | POA: Insufficient documentation

## 2015-08-11 DIAGNOSIS — Z7952 Long term (current) use of systemic steroids: Secondary | ICD-10-CM | POA: Insufficient documentation

## 2015-08-11 DIAGNOSIS — L539 Erythematous condition, unspecified: Secondary | ICD-10-CM | POA: Insufficient documentation

## 2015-08-11 DIAGNOSIS — Z79899 Other long term (current) drug therapy: Secondary | ICD-10-CM | POA: Insufficient documentation

## 2015-08-11 DIAGNOSIS — Z8619 Personal history of other infectious and parasitic diseases: Secondary | ICD-10-CM | POA: Insufficient documentation

## 2015-08-11 DIAGNOSIS — Z8659 Personal history of other mental and behavioral disorders: Secondary | ICD-10-CM | POA: Insufficient documentation

## 2015-08-11 DIAGNOSIS — B353 Tinea pedis: Secondary | ICD-10-CM

## 2015-08-11 DIAGNOSIS — L739 Follicular disorder, unspecified: Secondary | ICD-10-CM | POA: Insufficient documentation

## 2015-08-11 MED ORDER — CLINDAMYCIN PHOSPHATE 1 % EX GEL: Freq: Two times a day (BID) | CUTANEOUS | Status: DC

## 2015-08-11 MED ORDER — IBUPROFEN 800 MG PO TABS: 800.0000 mg | ORAL_TABLET | Freq: Three times a day (TID) | ORAL | Status: DC

## 2015-08-11 MED ORDER — CEPHALEXIN 500 MG PO CAPS: 500.0000 mg | ORAL_CAPSULE | Freq: Four times a day (QID) | ORAL | Status: DC

## 2015-08-11 MED ORDER — CLOTRIMAZOLE 1 % EX CREA: TOPICAL_CREAM | CUTANEOUS | Status: DC

## 2015-08-11 NOTE — ED Provider Notes (Signed)
CSN: [id number]     Arrival date & time 08/11/15  1341 History  By signing my name below, I, Soijett Blue, attest that this documentation has been prepared under the direction and in the presence of Gloriann Loan, PA-C Electronically Signed: Soijett Blue, ED Scribe. 08/11/2015. 2:52 PM.   Chief Complaint  Patient presents with  . Abscess  . Nail Problem      The history is provided by the patient. No language interpreter was used.    Kathleen [name] is a 25 y.o. female with a medical hx of HTN who presents to the Emergency Department complaining of bilateral pinky toenail problem onset 3 weeks. She notes that there is a burning sensation between her toes and there is peeling to the area. She has tried to cut her toenails down with no relief of her symptoms. Pt bilateral pinky toe is worsened with standing. Pt is borderline diabetic. Pt reports that she was seen by a surgeon in the past for a bunionectomy. She notes that she has not tried any medications for the relief of her symptoms. She denies fever, chills, gait problem, and any other symptoms.   Pt secondarily complains of abscess to her right axilla x 2 weeks. She notes that she has multiple abscesses on her body, including, chest area, and groin area. Pt reports that one area drained today. Pt tried 2 OTC advil for the relief of her symptoms. Pt denies any other symptoms. Pt has seen a dermatologist in the past for her warts in the past.    Past Medical History  Diagnosis Date  . Depression   . Bipolar 1 disorder ([place])   . Headache(784.0)   . STD (female)   . Hypertension   . Schizophrenia [facility])    Past Surgical History  Procedure Laterality Date  . Bunionectomy      both feet   Family History  Problem Relation Age of Onset  . Diabetes Mother   . Hypertension Mother    Social History  Substance Use Topics  . Smoking status: Never Smoker   . Smokeless tobacco: None  . Alcohol Use: No   OB History    No data  available     Review of Systems  Constitutional: Negative for fever and chills.  Musculoskeletal: Positive for arthralgias. Negative for gait problem.  Skin: Positive for color change.       Multiple abscesses to body  All other systems reviewed and are negative.     Allergies  Other  Home Medications   Prior to Admission medications   Medication Sig Start Date End Date Taking? Authorizing Provider  cephALEXin (KEFLEX) 500 MG capsule Take 1 capsule (500 mg total) by mouth 4 (four) times daily. 08/11/15   Gloriann Loan, PA-C  clindamycin (CLINDAGEL) 1 % gel Apply topically 2 (two) times daily. 08/11/15   Gloriann Loan, PA-C  clotrimazole (LOTRIMIN) 1 % cream Apply to affected area 2 times daily for 4 weeks. 08/11/15   Gloriann Loan, PA-C  Dextromethorphan-Guaifenesin (MUCINEX DM PO) Take 15 mLs by mouth daily as needed. For cough per patient    Historical Provider, MD  famotidine (PEPCID) 20 MG tablet Take 1 tablet (20 mg total) by mouth 2 (two) times daily. 05/05/15   Shawn C Joy, PA-C  guaiFENesin (ROBITUSSIN) 100 MG/5ML SOLN Take 5 mLs by mouth every 4 (four) hours as needed for cough or to loosen phlegm.    Historical Provider, MD  ibuprofen (ADVIL,MOTRIN) 800 MG tablet Take  1 tablet (800 mg total) by mouth 3 (three) times daily. 08/11/15   Gloriann Loan, PA-C  predniSONE (DELTASONE) 10 MG tablet Take 4 tablets (40 mg total) by mouth daily. 05/05/15   Shawn C Joy, PA-C   BP 122/86 mmHg  Pulse 93  Temp(Src) 98.1 F (36.7 C) (Oral)  Resp 18  Ht 5\' 9"  (1.753 m)  Wt 107.049 kg  BMI 34.84 kg/m2  SpO2 99%  LMP 07/01/2015 Physical Exam  Constitutional: She is oriented to person, place, and time. She appears well-developed and well-nourished.  HENT:  Head: Normocephalic and atraumatic.  Right Ear: External ear normal.  Left Ear: External ear normal.  Eyes: Conjunctivae are normal. No scleral icterus.  Neck: No tracheal deviation present.  Pulmonary/Chest: Effort normal. No respiratory  distress.  Abdominal: She exhibits no distension.  Musculoskeletal: Normal range of motion.  Patient able to wiggle all toes without pain.  Neurological: She is alert and oriented to person, place, and time.  Strength and sensation intact bilaterally throughout lower extremities. Gait slowed but normal.  Skin: Skin is warm and dry.  Multiple areas consistent with folliculitis on axilla, chest, and pubis.  No drainage.  Mild erythema.  No induration or fluctuance. Bilateral toes in poor condition.  No erythema, warmth, swelling, or signs of infection.  Macerated white skin between bilateral pinky toes. No paronychia or felon.   Psychiatric: She has a normal mood and affect. Her behavior is normal.    ED Course  Procedures (including critical care time) DIAGNOSTIC STUDIES: Oxygen Saturation is 99% on RA, nl by my interpretation.    COORDINATION OF CARE: 2:51 PM Discussed treatment plan with pt at bedside which includes abx Rx, ibuprofen Rx, referral to podiatrist, referral to dermatology, and pt agreed to plan.    Labs Review Labs Reviewed - No data to display  Imaging Review No results found.    EKG Interpretation None      MDM   Final diagnoses:  Tinea pedis of right foot  Folliculitis    Patient presents with findings consistent with tinea pedis and folliculitis. No abscess. No evidence of paronychia or felon. No indication for I&D procedure at this time. VSS, NAD. Will discharge home with topical clindamycin, Lotrimin, Keflex given the number of lesions. This will cover for any possible associated cellulitis. Ibuprofen for pain control. Follow-up with podiatry and dermatology. Discussed return precautions. Patient agrees and acknowledges the above plan for discharge.  I personally performed the services described in this documentation, which was scribed in my presence. The recorded information has been reviewed and is accurate.    Gloriann Loan, PA-C 08/11/15  [place], MD 08/12/15 [phone]

## 2015-08-11 NOTE — ED Notes (Signed)
Declined W/C at D/C and was escorted to lobby by RN. 

## 2015-08-11 NOTE — ED Notes (Signed)
Pt c/o multiple abscess in different areas ( thigh, axilla and groin) x 2 weeks and toe nail problem to B/L pinky toes x 3 weeks.

## 2015-08-11 NOTE — Discharge Instructions (Signed)
Athlete's Foot Athlete's foot (tinea pedis) is a fungal infection of the skin on the feet. It often occurs on the skin between the toes or underneath the toes. It can also occur on the soles of the feet. Athlete's foot is more likely to occur in hot, humid weather. Not washing your feet or changing your socks often enough can contribute to athlete's foot. The infection can spread from person to person (contagious). CAUSES Athlete's foot is caused by a fungus. This fungus thrives in warm, moist places. Most people get athlete's foot by sharing shower stalls, towels, and wet floors with an infected person. People with weakened immune systems, including those with diabetes, may be more likely to get athlete's foot. SYMPTOMS   Itchy areas between the toes or on the soles of the feet.  White, flaky, or scaly areas between the toes or on the soles of the feet.  Tiny, intensely itchy blisters between the toes or on the soles of the feet.  Tiny cuts on the skin. These cuts can develop a bacterial infection.  Thick or discolored toenails. DIAGNOSIS  Your caregiver can usually tell what the problem is by doing a physical exam. Your caregiver may also take a skin sample from the rash area. The skin sample may be examined under a microscope, or it may be tested to see if fungus will grow in the sample. A sample may also be taken from your toenail for testing. TREATMENT  Over-the-counter and prescription medicines can be used to kill the fungus. These medicines are available as powders or creams. Your caregiver can suggest medicines for you. Fungal infections respond slowly to treatment. You may need to continue using your medicine for several weeks. PREVENTION   Do not share towels.  Wear sandals in wet areas, such as shared locker rooms and shared showers.  Keep your feet dry. Wear shoes that allow air to circulate. Wear cotton or wool socks. HOME CARE INSTRUCTIONS   Take medicines as directed by  your caregiver. Do not use steroid creams on athlete's foot.  Keep your feet clean and cool. Wash your feet daily and dry them thoroughly, especially between your toes.  Change your socks every day. Wear cotton or wool socks. In hot climates, you may need to change your socks 2 to 3 times per day.  Wear sandals or canvas tennis shoes with good air circulation.  If you have blisters, soak your feet in Burow's solution or Epsom salts for 20 to 30 minutes, 2 times a day to dry out the blisters. Make sure you dry your feet thoroughly afterward. SEEK MEDICAL CARE IF:   You have a fever.  You have swelling, soreness, warmth, or redness in your foot.  You are not getting better after 7 days of treatment.  You are not completely cured after 30 days.  You have any problems caused by your medicines. MAKE SURE YOU:   Understand these instructions.  Will watch your condition.  Will get help right away if you are not doing well or get worse.   This information is not intended to replace advice given to you by your health care provider. Make sure you discuss any questions you have with your health care provider.   Document Released: 05/30/2000 Document Revised: 08/25/2011 Document Reviewed: 12/04/2014 Elsevier Interactive Patient Education 2016 Elsevier Inc.  Folliculitis Folliculitis is redness, soreness, and swelling (inflammation) of the hair follicles. This condition can occur anywhere on the body. People with weakened immune systems, diabetes,  or obesity have a greater risk of getting folliculitis. CAUSES  Bacterial infection. This is the most common cause.  Fungal infection.  Viral infection.  Contact with certain chemicals, especially oils and tars. Long-term folliculitis can result from bacteria that live in the nostrils. The bacteria may trigger multiple outbreaks of folliculitis over time. SYMPTOMS Folliculitis most commonly occurs on the scalp, thighs, legs, back,  buttocks, and areas where hair is shaved frequently. An early sign of folliculitis is a small, white or yellow, pus-filled, itchy lesion (pustule). These lesions appear on a red, inflamed follicle. They are usually less than 0.2 inches (5 mm) wide. When there is an infection of the follicle that goes deeper, it becomes a boil or furuncle. A group of closely packed boils creates a larger lesion (carbuncle). Carbuncles tend to occur in hairy, sweaty areas of the body. DIAGNOSIS  Your caregiver can usually tell what is wrong by doing a physical exam. A sample may be taken from one of the lesions and tested in a lab. This can help determine what is causing your folliculitis. TREATMENT  Treatment may include:  Applying warm compresses to the affected areas.  Taking antibiotic medicines orally or applying them to the skin.  Draining the lesions if they contain a large amount of pus or fluid.  Laser hair removal for cases of long-lasting folliculitis. This helps to prevent regrowth of the hair. HOME CARE INSTRUCTIONS  Apply warm compresses to the affected areas as directed by your caregiver.  If antibiotics are prescribed, take them as directed. Finish them even if you start to feel better.  You may take over-the-counter medicines to relieve itching.  Do not shave irritated skin.  Follow up with your caregiver as directed. SEEK IMMEDIATE MEDICAL CARE IF:   You have increasing redness, swelling, or pain in the affected area.  You have a fever. MAKE SURE YOU:  Understand these instructions.  Will watch your condition.  Will get help right away if you are not doing well or get worse.   This information is not intended to replace advice given to you by your health care provider. Make sure you discuss any questions you have with your health care provider.   Document Released: 08/11/2001 Document Revised: 06/23/2014 Document Reviewed: 09/02/2011 Elsevier Interactive Patient Education [employer]

## 2015-10-20 ENCOUNTER — Emergency Department (HOSPITAL_COMMUNITY)
Admission: EM | Admit: 2015-10-20 | Discharge: 2015-10-20 | Disposition: A | Discharge status: Home / Self Care | Payer: Medicaid Other | Attending: Emergency Medicine | Admitting: Emergency Medicine

## 2015-10-20 ENCOUNTER — Encounter (HOSPITAL_COMMUNITY): Payer: Self-pay | Admitting: Emergency Medicine

## 2015-10-20 DIAGNOSIS — F319 Bipolar disorder, unspecified: Secondary | ICD-10-CM | POA: Insufficient documentation

## 2015-10-20 DIAGNOSIS — E669 Obesity, unspecified: Secondary | ICD-10-CM | POA: Insufficient documentation

## 2015-10-20 DIAGNOSIS — Z7952 Long term (current) use of systemic steroids: Secondary | ICD-10-CM | POA: Insufficient documentation

## 2015-10-20 DIAGNOSIS — Z3202 Encounter for pregnancy test, result negative: Secondary | ICD-10-CM | POA: Insufficient documentation

## 2015-10-20 DIAGNOSIS — A64 Unspecified sexually transmitted disease: Secondary | ICD-10-CM | POA: Insufficient documentation

## 2015-10-20 DIAGNOSIS — I10 Essential (primary) hypertension: Secondary | ICD-10-CM | POA: Insufficient documentation

## 2015-10-20 DIAGNOSIS — Z792 Long term (current) use of antibiotics: Secondary | ICD-10-CM | POA: Insufficient documentation

## 2015-10-20 DIAGNOSIS — Z79899 Other long term (current) drug therapy: Secondary | ICD-10-CM | POA: Insufficient documentation

## 2015-10-20 LAB — WET PREP, GENITAL
SPERM: NONE SEEN
Trich, Wet Prep: NONE SEEN

## 2015-10-20 LAB — I-STAT CHEM 8, ED
BUN: 5 mg/dL — ABNORMAL LOW (ref 6–20)
CALCIUM ION: 1.12 mmol/L (ref 1.12–1.23)
CHLORIDE: 101 mmol/L (ref 101–111)
CREATININE: 0.9 mg/dL (ref 0.44–1.00)
GLUCOSE: 93 mg/dL (ref 65–99)
HCT: 41 % (ref 36.0–46.0)
HEMOGLOBIN: 13.9 g/dL (ref 12.0–15.0)
POTASSIUM: 4.1 mmol/L (ref 3.5–5.1)
Sodium: 140 mmol/L (ref 135–145)
TCO2: 26 mmol/L (ref 0–100)

## 2015-10-20 LAB — URINALYSIS, ROUTINE W REFLEX MICROSCOPIC
BILIRUBIN URINE: NEGATIVE
GLUCOSE, UA: NEGATIVE mg/dL
Ketones, ur: NEGATIVE mg/dL
Leukocytes, UA: NEGATIVE
Nitrite: NEGATIVE
Protein, ur: NEGATIVE mg/dL
SPECIFIC GRAVITY, URINE: 1.029 (ref 1.005–1.030)
pH: 6 (ref 5.0–8.0)

## 2015-10-20 LAB — URINE MICROSCOPIC-ADD ON

## 2015-10-20 LAB — PREGNANCY, URINE: PREG TEST UR: NEGATIVE

## 2015-10-20 MED ORDER — CEFTRIAXONE SODIUM 250 MG IJ SOLR
250.0000 mg | Freq: Once | INTRAMUSCULAR | Status: AC
  Administered 2015-10-20: 250 mg via INTRAMUSCULAR
  Filled 2015-10-20: qty 250

## 2015-10-20 MED ORDER — METRONIDAZOLE 500 MG PO TABS: 500.0000 mg | ORAL_TABLET | Freq: Two times a day (BID) | ORAL | Status: DC

## 2015-10-20 MED ORDER — AZITHROMYCIN 250 MG PO TABS
1000.0000 mg | ORAL_TABLET | Freq: Once | ORAL | Status: AC
  Administered 2015-10-20: 1000 mg via ORAL
  Filled 2015-10-20: qty 4

## 2015-10-20 MED ORDER — DOXYCYCLINE HYCLATE 100 MG PO CAPS: 100.0000 mg | ORAL_CAPSULE | Freq: Two times a day (BID) | ORAL | Status: DC

## 2015-10-20 NOTE — Discharge Instructions (Signed)
Ms. Jenette Donalson [place],  Nice meeting you! Please follow-up with your gynecologist for your visit today and your irregular cycles. Return to the emergency department if you develop fevers, chills, abdominal pain, nausea/vomiting, new/worsening symptoms. Feel better soon!  S. Wendie Simmer, PA-C

## 2015-10-20 NOTE — ED Provider Notes (Signed)
CSN: [id number]     Arrival date & time 10/20/15  0913 History   First MD Initiated Contact with Patient 10/20/15 [phone]     Chief Complaint  Patient presents with  . Vaginal Discharge   HPI  Kathleen [name] is a 25 y.o. female PMH significant for depression, bipolar 1 disorder, STD, hypertension, schizophrenia presenting with a 2 week history of brown vaginal discharge and dysuria. She is also concerned regarding her glucose levels. She states someone at work told her that she may be diabetic so she wanted to "get checked out." She has not had a menstrual period for 3 months and she is not on birth control. She denies fevers, chills, chest pain, shortness of breath, headaches, abdominal pain, nausea, vomiting, diarrhea, other vaginal complaints.  Past Medical History  Diagnosis Date  . Depression   . Bipolar 1 disorder ([place])   . Headache(784.0)   . STD (female)   . Hypertension   . Schizophrenia [facility])    Past Surgical History  Procedure Laterality Date  . Bunionectomy      both feet   Family History  Problem Relation Age of Onset  . Diabetes Mother   . Hypertension Mother    Social History  Substance Use Topics  . Smoking status: Never Smoker   . Smokeless tobacco: Not on file  . Alcohol Use: No   OB History    No data available     Review of Systems  Ten systems are reviewed and are negative for acute change except as noted in the HPI  Allergies  Other  Home Medications   Prior to Admission medications   Medication Sig Start Date End Date Taking? Authorizing Provider  cephALEXin (KEFLEX) 500 MG capsule Take 1 capsule (500 mg total) by mouth 4 (four) times daily. 08/11/15   Gloriann Loan, PA-C  clindamycin (CLINDAGEL) 1 % gel Apply topically 2 (two) times daily. 08/11/15   Gloriann Loan, PA-C  clotrimazole (LOTRIMIN) 1 % cream Apply to affected area 2 times daily for 4 weeks. 08/11/15   Gloriann Loan, PA-C  Dextromethorphan-Guaifenesin (MUCINEX DM PO) Take 15 mLs by mouth  daily as needed. For cough per patient    Historical Provider, MD  famotidine (PEPCID) 20 MG tablet Take 1 tablet (20 mg total) by mouth 2 (two) times daily. 05/05/15   Shawn C Joy, PA-C  guaiFENesin (ROBITUSSIN) 100 MG/5ML SOLN Take 5 mLs by mouth every 4 (four) hours as needed for cough or to loosen phlegm.    Historical Provider, MD  ibuprofen (ADVIL,MOTRIN) 800 MG tablet Take 1 tablet (800 mg total) by mouth 3 (three) times daily. 08/11/15   Gloriann Loan, PA-C  predniSONE (DELTASONE) 10 MG tablet Take 4 tablets (40 mg total) by mouth daily. 05/05/15   Shawn C Joy, PA-C   BP 109/62 mmHg  Pulse 74  SpO2 100%  LMP 07/23/2015 (Approximate) Physical Exam  Constitutional: She appears well-developed and well-nourished. No distress.  Obese  HENT:  Head: Normocephalic and atraumatic.  Eyes: Conjunctivae are normal. Right eye exhibits no discharge. Left eye exhibits no discharge. No scleral icterus.  Neck: No tracheal deviation present.  Cardiovascular: Normal rate, regular rhythm, normal heart sounds and intact distal pulses.  Exam reveals no gallop and no friction rub.   No murmur heard. Pulmonary/Chest: Effort normal and breath sounds normal. No respiratory distress. She has no wheezes. She has no rales. She exhibits no tenderness.  Abdominal: Soft. Bowel sounds are normal. She exhibits no  distension and no mass. There is no tenderness. There is no rebound and no guarding.  Genitourinary:  Chaperoned pelvic exam: normal external genitalia, vulva, vagina, cervix, uterus. Left adnexal tenderness. Slight cervical motion tenderness.    Musculoskeletal: She exhibits no edema.  Lymphadenopathy:    She has no cervical adenopathy.  Neurological: She is alert. Coordination normal.  Skin: Skin is warm and dry. No rash noted. She is not diaphoretic. No erythema.  Psychiatric: She has a normal mood and affect. Her behavior is normal.  Nursing note and vitals reviewed.   ED Course  Procedures Labs  Review Labs Reviewed  WET PREP, GENITAL - Abnormal; Notable for the following:    Yeast Wet Prep HPF POC PRESENT (*)    Clue Cells Wet Prep HPF POC PRESENT (*)    WBC, Wet Prep HPF POC MANY (*)    All other components within normal limits  URINALYSIS, ROUTINE W REFLEX MICROSCOPIC (NOT AT [facility]) - Abnormal; Notable for the following:    APPearance CLOUDY (*)    Hgb urine dipstick TRACE (*)    All other components within normal limits  URINE MICROSCOPIC-ADD ON - Abnormal; Notable for the following:    Squamous Epithelial / LPF 6-30 (*)    Bacteria, UA MANY (*)    Crystals CA OXALATE CRYSTALS (*)    All other components within normal limits  I-STAT CHEM 8, ED - Abnormal; Notable for the following:    BUN 5 (*)    All other components within normal limits  PREGNANCY, URINE  RPR  HIV ANTIBODY (ROUTINE TESTING)  POC URINE PREG, ED  POC URINE PREG, ED  GC/CHLAMYDIA PROBE AMP ([place]) NOT AT [facility]   MDM   Final diagnoses:  STI (sexually transmitted infection)   Patient nontoxic-appearing, vital signs stable. Benign abdominal and pelvic exam. Wet prep demonstrates yeast, clue cells, WBCs. Urinalysis with trace hemoglobin and calcium oxalate crystals. Chem 8 unremarkable for hyperglycemia. Pregnancy urine negative. GC/chlamydia, HIV, RPR labs sent off.  Patient to be discharged with instructions to follow up with OBGYN. Discussed importance of using protection when sexually active. Pt understands that they have GC/Chlamydia cultures pending and that they will need to inform all sexual partners if results return positive. Pt has been treated prophylacticly with azithromycin and rocephin due to pts history, pelvic exam, and wet prep with increased WBCs. Pt has also been treated with flagyl for Bacterial Vaginosis. Pt has been advised to not drink alcohol while on this medication.  Patient may be safely discharged home with doxycycline. Discussed reasons for return. Patient to follow-up with  primary care provider within one week. Patient in understanding and agreement with the plan.   [place], PA-C 10/20/15 [place], MD 10/20/15 [phone]

## 2015-10-20 NOTE — ED Notes (Signed)
Pt c/o brown vaginal discharge and lower abd pain for a few weeks. Reports LMP 3 mo's ago, not on birth control. VSS, afebrile, denies pain presently.

## 2015-10-21 LAB — HIV ANTIBODY (ROUTINE TESTING W REFLEX): HIV Screen 4th Generation wRfx: NONREACTIVE

## 2015-10-21 LAB — RPR: RPR: NONREACTIVE

## 2015-10-22 LAB — GC/CHLAMYDIA PROBE AMP (~~LOC~~) NOT AT ARMC
CHLAMYDIA, DNA PROBE: NEGATIVE
Neisseria Gonorrhea: NEGATIVE

## 2015-12-13 ENCOUNTER — Ambulatory Visit: Payer: Self-pay | Admitting: Obstetrics & Gynecology

## 2015-12-24 ENCOUNTER — Encounter (HOSPITAL_COMMUNITY): Payer: Self-pay | Admitting: Family Medicine

## 2015-12-24 ENCOUNTER — Emergency Department (HOSPITAL_COMMUNITY): Payer: Self-pay

## 2015-12-24 ENCOUNTER — Emergency Department (HOSPITAL_COMMUNITY)
Admission: EM | Admit: 2015-12-24 | Discharge: 2015-12-24 | Disposition: A | Discharge status: Home / Self Care | Payer: Self-pay | Attending: Emergency Medicine | Admitting: Emergency Medicine

## 2015-12-24 DIAGNOSIS — R102 Pelvic and perineal pain unspecified side: Secondary | ICD-10-CM

## 2015-12-24 DIAGNOSIS — N644 Mastodynia: Secondary | ICD-10-CM | POA: Insufficient documentation

## 2015-12-24 DIAGNOSIS — I10 Essential (primary) hypertension: Secondary | ICD-10-CM | POA: Insufficient documentation

## 2015-12-24 DIAGNOSIS — N939 Abnormal uterine and vaginal bleeding, unspecified: Secondary | ICD-10-CM | POA: Insufficient documentation

## 2015-12-24 DIAGNOSIS — F319 Bipolar disorder, unspecified: Secondary | ICD-10-CM | POA: Insufficient documentation

## 2015-12-24 LAB — COMPREHENSIVE METABOLIC PANEL
ALBUMIN: 3.5 g/dL (ref 3.5–5.0)
ALK PHOS: 50 U/L (ref 38–126)
ALT: 17 U/L (ref 14–54)
ANION GAP: 6 (ref 5–15)
AST: 21 U/L (ref 15–41)
BUN: 6 mg/dL (ref 6–20)
CO2: 26 mmol/L (ref 22–32)
Calcium: 8.9 mg/dL (ref 8.9–10.3)
Chloride: 106 mmol/L (ref 101–111)
Creatinine, Ser: 1.04 mg/dL — ABNORMAL HIGH (ref 0.44–1.00)
GFR calc Af Amer: >60 mL/min (ref >60)
GFR calc non Af Amer: >60 mL/min (ref >60)
GLUCOSE: 104 mg/dL — AB (ref 65–99)
Potassium: 3.7 mmol/L (ref 3.5–5.1)
Sodium: 138 mmol/L (ref 135–145)
Total Bilirubin: 0.5 mg/dL (ref 0.3–1.2)
Total Protein: 6.6 g/dL (ref 6.5–8.1)

## 2015-12-24 LAB — URINALYSIS, ROUTINE W REFLEX MICROSCOPIC
BILIRUBIN URINE: NEGATIVE
GLUCOSE, UA: NEGATIVE mg/dL
KETONES UR: NEGATIVE mg/dL
Leukocytes, UA: NEGATIVE
Nitrite: NEGATIVE
PROTEIN: NEGATIVE mg/dL
Specific Gravity, Urine: 1.023 (ref 1.005–1.030)
pH: 6 (ref 5.0–8.0)

## 2015-12-24 LAB — CBC WITH DIFFERENTIAL/PLATELET
BASOS PCT: 0 %
Basophils Absolute: 0 10*3/uL (ref 0.0–0.1)
EOS ABS: 0.1 10*3/uL (ref 0.0–0.7)
Eosinophils Relative: 1 %
HCT: 34.2 % — ABNORMAL LOW (ref 36.0–46.0)
Hemoglobin: 11.1 g/dL — ABNORMAL LOW (ref 12.0–15.0)
Lymphocytes Relative: 35 %
Lymphs Abs: 2.8 10*3/uL (ref 0.7–4.0)
MCH: 26.3 pg (ref 26.0–34.0)
MCHC: 32.5 g/dL (ref 30.0–36.0)
MCV: 81 fL (ref 78.0–100.0)
MONO ABS: 0.6 10*3/uL (ref 0.1–1.0)
MONOS PCT: 8 %
Neutro Abs: 4.5 10*3/uL (ref 1.7–7.7)
Neutrophils Relative %: 56 %
Platelets: 228 10*3/uL (ref 150–400)
RBC: 4.22 MIL/uL (ref 3.87–5.11)
RDW: 13 % (ref 11.5–15.5)
WBC: 7.9 10*3/uL (ref 4.0–10.5)

## 2015-12-24 LAB — GC/CHLAMYDIA PROBE AMP (~~LOC~~) NOT AT ARMC
CHLAMYDIA, DNA PROBE: NEGATIVE
NEISSERIA GONORRHEA: NEGATIVE

## 2015-12-24 LAB — WET PREP, GENITAL
CLUE CELLS WET PREP: NONE SEEN
Sperm: NONE SEEN
Trich, Wet Prep: NONE SEEN
Yeast Wet Prep HPF POC: NONE SEEN

## 2015-12-24 LAB — I-STAT BETA HCG BLOOD, ED (MC, WL, AP ONLY): I-stat hCG, quantitative: <5 m[IU]/mL (ref <5)

## 2015-12-24 LAB — URINE MICROSCOPIC-ADD ON

## 2015-12-24 NOTE — ED Notes (Signed)
Pt here for breast pain that started with her nipples and feels like an elephant is on chest, abd pain, sharp stabbing and vaginal bleeding that started this weekend. sts the discharge is foul odor and thick. sts LMP is was in June.

## 2015-12-24 NOTE — ED Notes (Signed)
Pt ambulated to restroom from room. 

## 2015-12-24 NOTE — ED Provider Notes (Signed)
CSN: [id number]     Arrival date & time 12/24/15  [id number] History   First MD Initiated Contact with Patient 12/24/15 0703     Chief Complaint  Patient presents with  . Breast Pain  . Abdominal Pain  . Vaginal Bleeding     (Consider location/radiation/quality/duration/timing/severity/associated sxs/prior Treatment) HPI Comments: Patient with PMH of schizophrenia, bipolar, HTN, and prior STD presents to the ED with a chief complaint of multiple complaints.  Breast Pain: Onset last week.  No associated fevers or chills.  Not breastfeeding or lactating.  No new masses or lesions.  Reports discharge from nipples, which has resolved.  Has not taken anything for her symptoms.  Vaginal Discharge/Bleeding: Onset 1 week ago.  LMP in June.  Hx of STD.  Associated intermittent sharp pelvic pain, foul discharge and blood clots.  The history is provided by the patient. No language interpreter was used.    Past Medical History  Diagnosis Date  . Depression   . Bipolar 1 disorder ([place])   . Headache(784.0)   . STD (female)   . Hypertension   . Schizophrenia [facility])    Past Surgical History  Procedure Laterality Date  . Bunionectomy      both feet   Family History  Problem Relation Age of Onset  . Diabetes Mother   . Hypertension Mother    Social History  Substance Use Topics  . Smoking status: Never Smoker   . Smokeless tobacco: None  . Alcohol Use: No   OB History    No data available     Review of Systems  All other systems reviewed and are negative.     Allergies  Other  Home Medications   Prior to Admission medications   Medication Sig Start Date End Date Taking? Authorizing Provider  Dextromethorphan-Guaifenesin (MUCINEX DM PO) Take 15 mLs by mouth daily as needed (cough). For cough per patient   Yes Historical Provider, MD  diphenhydrAMINE (SOMINEX) 25 MG tablet Take 25 mg by mouth at bedtime as needed for allergies or sleep.   Yes Historical Provider, MD  guaiFENesin  (ROBITUSSIN) 100 MG/5ML SOLN Take 5 mLs by mouth every 4 (four) hours as needed for cough or to loosen phlegm.   Yes Historical Provider, MD   BP 116/89 mmHg  Pulse 94  Temp(Src) 98.2 F (36.8 C) (Oral)  Resp 17  SpO2 100%  LMP 11/29/2015 Physical Exam  Constitutional: She is oriented to person, place, and time. She appears well-developed and well-nourished.  HENT:  Head: Normocephalic and atraumatic.  Eyes: Conjunctivae and EOM are normal. Pupils are equal, round, and reactive to light.  Neck: Normal range of motion. Neck supple.  Cardiovascular: Normal rate and regular rhythm.  Exam reveals no gallop and no friction rub.   No murmur heard. Pulmonary/Chest: Effort normal and breath sounds normal. No respiratory distress. She has no wheezes. She has no rales. She exhibits no tenderness.  Female RN chaperone present for breast exam Fibrocystic breasts, mild tenderness, no evidence of abscess or discharge, no nipple discharge, no erythema  Abdominal: Soft. Bowel sounds are normal. She exhibits no distension and no mass. There is no tenderness. There is no rebound and no guarding.  No focal abdominal tenderness, no RLQ tenderness or pain at McBurney's point, no RUQ tenderness or Murphy's sign, no left-sided abdominal tenderness, no fluid wave, or signs of peritonitis   Genitourinary:  Pelvic exam chaperoned by female ER tech, mild right adnexal tenderness, no left adnexal tenderness, no  uterine tenderness, no vaginal discharge, moderate old blood, no active hemorrhage, no CMT or friability, no foreign body, no injury to the external genitalia, no other significant findings   Musculoskeletal: Normal range of motion. She exhibits no edema or tenderness.  Neurological: She is alert and oriented to person, place, and time.  Skin: Skin is warm and dry.  Psychiatric: She has a normal mood and affect. Her behavior is normal. Judgment and thought content normal.  Nursing note and vitals  reviewed.   ED Course  Procedures (including critical care time) Results for orders placed or performed during the hospital encounter of 12/24/15  Wet prep, genital  Result Value Ref Range   Yeast Wet Prep HPF POC NONE SEEN NONE SEEN   Trich, Wet Prep NONE SEEN NONE SEEN   Clue Cells Wet Prep HPF POC NONE SEEN NONE SEEN   WBC, Wet Prep HPF POC FEW (A) NONE SEEN   Sperm NONE SEEN   CBC with Differential/Platelet  Result Value Ref Range   WBC 7.9 4.0 - 10.5 K/uL   RBC 4.22 3.87 - 5.11 MIL/uL   Hemoglobin 11.1 (L) 12.0 - 15.0 g/dL   HCT 34.2 (L) 36.0 - 46.0 %   MCV 81.0 78.0 - 100.0 fL   MCH 26.3 26.0 - 34.0 pg   MCHC 32.5 30.0 - 36.0 g/dL   RDW 13.0 11.5 - 15.5 %   Platelets 228 150 - 400 K/uL   Neutrophils Relative % 56 %   Neutro Abs 4.5 1.7 - 7.7 K/uL   Lymphocytes Relative 35 %   Lymphs Abs 2.8 0.7 - 4.0 K/uL   Monocytes Relative 8 %   Monocytes Absolute 0.6 0.1 - 1.0 K/uL   Eosinophils Relative 1 %   Eosinophils Absolute 0.1 0.0 - 0.7 K/uL   Basophils Relative 0 %   Basophils Absolute 0.0 0.0 - 0.1 K/uL  Comprehensive metabolic panel  Result Value Ref Range   Sodium 138 135 - 145 mmol/L   Potassium 3.7 3.5 - 5.1 mmol/L   Chloride 106 101 - 111 mmol/L   CO2 26 22 - 32 mmol/L   Glucose, Bld 104 (H) 65 - 99 mg/dL   BUN 6 6 - 20 mg/dL   Creatinine, Ser 1.04 (H) 0.44 - 1.00 mg/dL   Calcium 8.9 8.9 - 10.3 mg/dL   Total Protein 6.6 6.5 - 8.1 g/dL   Albumin 3.5 3.5 - 5.0 g/dL   AST 21 15 - 41 U/L   ALT 17 14 - 54 U/L   Alkaline Phosphatase 50 38 - 126 U/L   Total Bilirubin 0.5 0.3 - 1.2 mg/dL   GFR calc non Af Amer >60 >60 mL/min   GFR calc Af Amer >60 >60 mL/min   Anion gap 6 5 - 15  Urinalysis, Routine w reflex microscopic (not at [facility])  Result Value Ref Range   Color, Urine YELLOW YELLOW   APPearance CLEAR CLEAR   Specific Gravity, Urine 1.023 1.005 - 1.030   pH 6.0 5.0 - 8.0   Glucose, UA NEGATIVE NEGATIVE mg/dL   Hgb urine dipstick LARGE (A) NEGATIVE    Bilirubin Urine NEGATIVE NEGATIVE   Ketones, ur NEGATIVE NEGATIVE mg/dL   Protein, ur NEGATIVE NEGATIVE mg/dL   Nitrite NEGATIVE NEGATIVE   Leukocytes, UA NEGATIVE NEGATIVE  Urine microscopic-add on  Result Value Ref Range   Squamous Epithelial / LPF 0-5 (A) NONE SEEN   WBC, UA 0-5 0 - 5 WBC/hpf   RBC / HPF  TOO NUMEROUS TO COUNT 0 - 5 RBC/hpf   Bacteria, UA RARE (A) NONE SEEN   Urine-Other MUCOUS PRESENT   I-Stat Beta hCG blood, ED ([facility], [facility], [facility] only)  Result Value Ref Range   I-stat hCG, quantitative <5.0 <5 mIU/mL   Comment 3           US Transvaginal Non-ob  12/24/2015  CLINICAL DATA:  Right adnexal pain EXAM: TRANSABDOMINAL AND TRANSVAGINAL ULTRASOUND OF PELVIS DOPPLER ULTRASOUND OF OVARIES TECHNIQUE: Both transabdominal and transvaginal ultrasound examinations of the pelvis were performed. Transabdominal technique was performed for global imaging of the pelvis including uterus, ovaries, adnexal regions, and pelvic cul-de-sac. It was necessary to proceed with endovaginal exam following the transabdominal exam to visualize the ovaries. Color and duplex Doppler ultrasound was utilized to evaluate blood flow to the ovaries. COMPARISON:  None. FINDINGS: Uterus Measurements: 6.1 x 3.3 x 4.6 cm. No fibroids or other mass visualized. Endometrium Thickness: 2 mm in thickness. Fluid within the endometrial canal. No focal abnormality visualized. Right ovary Measurements: 4.2 x 2.0 x 3.2 cm. Multiple small follicles. No adnexal mass. Left ovary Measurements: 4.5 x 1.4 x 4.2 cm. Multiple small follicles. No adnexal mass. Pulsed Doppler evaluation of both ovaries demonstrates normal low-resistance arterial and venous waveforms. Other findings No abnormal free fluid. IMPRESSION: No acute findings.  No evidence of ovarian torsion. Electronically Signed   By: Rolm Baptise M.D.   On: 12/24/2015 09:51   US Pelvis Complete  12/24/2015  CLINICAL DATA:  Right adnexal pain EXAM: TRANSABDOMINAL AND TRANSVAGINAL  ULTRASOUND OF PELVIS DOPPLER ULTRASOUND OF OVARIES TECHNIQUE: Both transabdominal and transvaginal ultrasound examinations of the pelvis were performed. Transabdominal technique was performed for global imaging of the pelvis including uterus, ovaries, adnexal regions, and pelvic cul-de-sac. It was necessary to proceed with endovaginal exam following the transabdominal exam to visualize the ovaries. Color and duplex Doppler ultrasound was utilized to evaluate blood flow to the ovaries. COMPARISON:  None. FINDINGS: Uterus Measurements: 6.1 x 3.3 x 4.6 cm. No fibroids or other mass visualized. Endometrium Thickness: 2 mm in thickness. Fluid within the endometrial canal. No focal abnormality visualized. Right ovary Measurements: 4.2 x 2.0 x 3.2 cm. Multiple small follicles. No adnexal mass. Left ovary Measurements: 4.5 x 1.4 x 4.2 cm. Multiple small follicles. No adnexal mass. Pulsed Doppler evaluation of both ovaries demonstrates normal low-resistance arterial and venous waveforms. Other findings No abnormal free fluid. IMPRESSION: No acute findings.  No evidence of ovarian torsion. Electronically Signed   By: Rolm Baptise M.D.   On: 12/24/2015 09:51   [place] Art/ven Flow Abd Pelv Doppler  12/24/2015  CLINICAL DATA:  Right adnexal pain EXAM: TRANSABDOMINAL AND TRANSVAGINAL ULTRASOUND OF PELVIS DOPPLER ULTRASOUND OF OVARIES TECHNIQUE: Both transabdominal and transvaginal ultrasound examinations of the pelvis were performed. Transabdominal technique was performed for global imaging of the pelvis including uterus, ovaries, adnexal regions, and pelvic cul-de-sac. It was necessary to proceed with endovaginal exam following the transabdominal exam to visualize the ovaries. Color and duplex Doppler ultrasound was utilized to evaluate blood flow to the ovaries. COMPARISON:  None. FINDINGS: Uterus Measurements: 6.1 x 3.3 x 4.6 cm. No fibroids or other mass visualized. Endometrium Thickness: 2 mm in thickness. Fluid within the  endometrial canal. No focal abnormality visualized. Right ovary Measurements: 4.2 x 2.0 x 3.2 cm. Multiple small follicles. No adnexal mass. Left ovary Measurements: 4.5 x 1.4 x 4.2 cm. Multiple small follicles. No adnexal mass. Pulsed Doppler evaluation of both ovaries demonstrates normal  low-resistance arterial and venous waveforms. Other findings No abnormal free fluid. IMPRESSION: No acute findings.  No evidence of ovarian torsion. Electronically Signed   By: Rolm Baptise M.D.   On: 12/24/2015 09:51    I have personally reviewed and evaluated these lab results as part of my medical decision-making.   MDM   Final diagnoses:  Breast pain  Vaginal bleeding   Patient with breast pain and vaginal bleeding. No acute findings on exam, laboratory workup, or ultrasound. Patient is well-appearing. Recommend OB/GYN follow-up. Patient understands and agrees to plan. She is stable and ready for discharge.    Montine Circle, PA-C 12/24/15 [place], PA-C 12/24/15 [place], MD 12/24/15 1537

## 2015-12-24 NOTE — ED Notes (Signed)
Patient transported to Ultrasound 

## 2015-12-24 NOTE — Discharge Instructions (Signed)

## 2016-01-14 ENCOUNTER — Emergency Department (HOSPITAL_COMMUNITY)
Admission: EM | Admit: 2016-01-14 | Discharge: 2016-01-14 | Disposition: A | Discharge status: Home / Self Care | Payer: Medicaid Other | Attending: Emergency Medicine | Admitting: Emergency Medicine

## 2016-01-14 ENCOUNTER — Encounter (HOSPITAL_COMMUNITY): Payer: Self-pay | Admitting: Emergency Medicine

## 2016-01-14 DIAGNOSIS — R739 Hyperglycemia, unspecified: Secondary | ICD-10-CM | POA: Insufficient documentation

## 2016-01-14 DIAGNOSIS — I10 Essential (primary) hypertension: Secondary | ICD-10-CM | POA: Insufficient documentation

## 2016-01-14 DIAGNOSIS — E876 Hypokalemia: Secondary | ICD-10-CM | POA: Insufficient documentation

## 2016-01-14 DIAGNOSIS — N72 Inflammatory disease of cervix uteri: Secondary | ICD-10-CM | POA: Diagnosis present

## 2016-01-14 LAB — URINALYSIS, ROUTINE W REFLEX MICROSCOPIC
BILIRUBIN URINE: NEGATIVE
Glucose, UA: NEGATIVE mg/dL
Hgb urine dipstick: NEGATIVE
Ketones, ur: NEGATIVE mg/dL
Leukocytes, UA: NEGATIVE
NITRITE: NEGATIVE
Protein, ur: NEGATIVE mg/dL
SPECIFIC GRAVITY, URINE: 1.029 (ref 1.005–1.030)
pH: 6 (ref 5.0–8.0)

## 2016-01-14 LAB — POC URINE PREG, ED: PREG TEST UR: NEGATIVE

## 2016-01-14 LAB — WET PREP, GENITAL
Sperm: NONE SEEN
Trich, Wet Prep: NONE SEEN
Yeast Wet Prep HPF POC: NONE SEEN

## 2016-01-14 LAB — I-STAT CHEM 8, ED
BUN: 5 mg/dL — ABNORMAL LOW (ref 6–20)
CREATININE: 1 mg/dL (ref 0.44–1.00)
Calcium, Ion: 1.23 mmol/L (ref 1.13–1.30)
Chloride: 99 mmol/L — ABNORMAL LOW (ref 101–111)
Glucose, Bld: 168 mg/dL — ABNORMAL HIGH (ref 65–99)
HEMATOCRIT: 40 % (ref 36.0–46.0)
HEMOGLOBIN: 13.6 g/dL (ref 12.0–15.0)
POTASSIUM: 3.4 mmol/L — AB (ref 3.5–5.1)
Sodium: 140 mmol/L (ref 135–145)
TCO2: 26 mmol/L (ref 0–100)

## 2016-01-14 LAB — HIV ANTIBODY (ROUTINE TESTING W REFLEX): HIV SCREEN 4TH GENERATION: NONREACTIVE

## 2016-01-14 LAB — RPR: RPR: NONREACTIVE

## 2016-01-14 MED ORDER — AZITHROMYCIN 1 G PO PACK: 1.0000 g | PACK | Freq: Once | ORAL | Status: DC

## 2016-01-14 MED ORDER — POTASSIUM CHLORIDE CRYS ER 20 MEQ PO TBCR
40.0000 meq | EXTENDED_RELEASE_TABLET | Freq: Once | ORAL | Status: AC
  Administered 2016-01-14: 40 meq via ORAL
  Filled 2016-01-14: qty 2

## 2016-01-14 MED ORDER — CEFTRIAXONE SODIUM 250 MG IJ SOLR
250.0000 mg | Freq: Once | INTRAMUSCULAR | Status: AC
  Administered 2016-01-14: 250 mg via INTRAMUSCULAR
  Filled 2016-01-14: qty 250

## 2016-01-14 MED ORDER — LIDOCAINE HCL (PF) 1 % IJ SOLN
2.0000 mL | Freq: Once | INTRAMUSCULAR | Status: AC
  Administered 2016-01-14: 2 mL

## 2016-01-14 MED ORDER — AZITHROMYCIN 250 MG PO TABS
1000.0000 mg | ORAL_TABLET | Freq: Once | ORAL | Status: AC
  Administered 2016-01-14: 1000 mg via ORAL
  Filled 2016-01-14: qty 4

## 2016-01-14 MED ORDER — AZITHROMYCIN 1 G PO PACK
1.0000 g | PACK | Freq: Once | ORAL | Status: DC
  Filled 2016-01-14: qty 1

## 2016-01-14 NOTE — ED Provider Notes (Addendum)
[facility] DEPT Provider Note   CSN: [id number] Arrival date & time: 01/14/16  [id number]  First Provider Contact:  First MD Initiated Contact with Patient 01/14/16 0830        History   Chief Complaint Chief Complaint  Patient presents with  . Urinary Tract Infection    HPI Kathleen [name] is a 25 y.o. female.  HPI complains of vaginal discharge for the past 4 days and dysuria for 2 days with slight burning at urethral meatus and urinary frequency. No treatment prior to coming here no abdominal pain no fever no nausea or vomiting she does not feel ill. Dysuria is worse with urination. Symptoms are mild. Symptoms not improved by anything.  Past Medical History:  Diagnosis Date  . Bipolar 1 disorder ([place])   . Depression   . Headache(784.0)   . Hypertension   . Schizophrenia ([place])   . STD (female)   "Borderline diabetes."  Patient Active Problem List   Diagnosis Date Noted  . Schizoaffective disorder ([place]) 11/08/2012    Past Surgical History:  Procedure Laterality Date  . BUNIONECTOMY     both feet    OB History    No data available       Home Medications    Prior to Admission medications   Medication Sig Start Date End Date Taking? Authorizing Provider  Dextromethorphan-Guaifenesin (MUCINEX DM PO) Take 15 mLs by mouth daily as needed (cough). For cough per patient    Historical Provider, MD  diphenhydrAMINE (SOMINEX) 25 MG tablet Take 25 mg by mouth at bedtime as needed for allergies or sleep.    Historical Provider, MD  guaiFENesin (ROBITUSSIN) 100 MG/5ML SOLN Take 5 mLs by mouth every 4 (four) hours as needed for cough or to loosen phlegm.    Historical Provider, MD    Family History Family History  Problem Relation Age of Onset  . Diabetes Mother   . Hypertension Mother     Social History Social History  Substance Use Topics  . Smoking status: Never Smoker  . Smokeless tobacco: Never Used  . Alcohol use No   Positive alcohol use denies drug  use  Allergies   Other   Review of Systems Review of Systems  Constitutional: Negative.   HENT: Negative.   Respiratory: Negative.   Cardiovascular: Negative.   Gastrointestinal: Negative.   Genitourinary: Positive for dysuria, frequency and vaginal discharge.       Irregular menses. Last menstrual period 1.5 months ago  Musculoskeletal: Negative.   Skin: Negative.   Neurological: Negative.   Psychiatric/Behavioral: Negative.   All other systems reviewed and are negative.    Physical Exam Updated Vital Signs BP 136/96 (BP Location: Left Arm)   Pulse 98   Temp 98.1 F (36.7 C) (Oral)   Resp 18   Ht 5\' 9"  (1.753 m)   Wt 239 lb 4.8 oz (108.5 kg)   LMP 11/29/2015   SpO2 100%   BMI 35.34 kg/m   Physical Exam  Constitutional: She appears well-developed and well-nourished. No distress.  HENT:  Head: Normocephalic and atraumatic.  Eyes: Conjunctivae are normal. Pupils are equal, round, and reactive to light.  Neck: Neck supple. No tracheal deviation present. No thyromegaly present.  Cardiovascular: Normal rate and regular rhythm.   No murmur heard. Pulmonary/Chest: Effort normal and breath sounds normal.  Abdominal: Soft. Bowel sounds are normal. She exhibits no distension. There is no tenderness.  Obese  Genitourinary:  Genitourinary Comments: Pelvic exam no external lesion. White  vaginal discharge. Cervical os closed. Positive cervical motion tenderness. No adnexal masses or tenderness  Musculoskeletal: Normal range of motion. She exhibits no edema or tenderness.  Neurological: She is alert. Coordination normal.  Skin: Skin is warm and dry. No rash noted.  Psychiatric: She has a normal mood and affect.  Nursing note and vitals reviewed.    ED Treatments / Results  Labs (all labs ordered are listed, but only abnormal results are displayed) Labs Reviewed  WET PREP, GENITAL  URINALYSIS, ROUTINE W REFLEX MICROSCOPIC (NOT AT [facility])  HIV ANTIBODY (ROUTINE TESTING)    RPR  POC URINE PREG, ED  I-STAT CHEM 8, ED  GC/CHLAMYDIA PROBE AMP ([place]) NOT AT [facility]    EKG  EKG Interpretation None       Radiology No results found.  Procedures Procedures (including critical care time)  Medications Ordered in ED Medications - No data to display 12 noon patient resting comfortably. No distress.  Rocephin and Zithromax andoral potassium supplementation ordered prior to discharge.  Results for orders placed or performed during the hospital encounter of 01/14/16  Wet prep, genital  Result Value Ref Range   Yeast Wet Prep HPF POC NONE SEEN NONE SEEN   Trich, Wet Prep NONE SEEN NONE SEEN   Clue Cells Wet Prep HPF POC PRESENT (A) NONE SEEN   WBC, Wet Prep HPF POC MANY (A) NONE SEEN   Sperm NONE SEEN   Urinalysis, Routine w reflex microscopic- may I&O cath if menses  Result Value Ref Range   Color, Urine YELLOW YELLOW   APPearance HAZY (A) CLEAR   Specific Gravity, Urine 1.029 1.005 - 1.030   pH 6.0 5.0 - 8.0   Glucose, UA NEGATIVE NEGATIVE mg/dL   Hgb urine dipstick NEGATIVE NEGATIVE   Bilirubin Urine NEGATIVE NEGATIVE   Ketones, ur NEGATIVE NEGATIVE mg/dL   Protein, ur NEGATIVE NEGATIVE mg/dL   Nitrite NEGATIVE NEGATIVE   Leukocytes, UA NEGATIVE NEGATIVE  POC urine preg, ED  Result Value Ref Range   Preg Test, Ur NEGATIVE NEGATIVE  I-stat chem 8, ed  Result Value Ref Range   Sodium 140 135 - 145 mmol/L   Potassium 3.4 (L) 3.5 - 5.1 mmol/L   Chloride 99 (L) 101 - 111 mmol/L   BUN 5 (L) 6 - 20 mg/dL   Creatinine, Ser 1.00 0.44 - 1.00 mg/dL   Glucose, Bld 168 (H) 65 - 99 mg/dL   Calcium, Ion 1.23 1.13 - 1.30 mmol/L   TCO2 26 0 - 100 mmol/L   Hemoglobin 13.6 12.0 - 15.0 g/dL   HCT 40.0 36.0 - 46.0 %   US Transvaginal Non-ob  Result Date: 12/24/2015 CLINICAL DATA:  Right adnexal pain EXAM: TRANSABDOMINAL AND TRANSVAGINAL ULTRASOUND OF PELVIS DOPPLER ULTRASOUND OF OVARIES TECHNIQUE: Both transabdominal and transvaginal ultrasound  examinations of the pelvis were performed. Transabdominal technique was performed for global imaging of the pelvis including uterus, ovaries, adnexal regions, and pelvic cul-de-sac. It was necessary to proceed with endovaginal exam following the transabdominal exam to visualize the ovaries. Color and duplex Doppler ultrasound was utilized to evaluate blood flow to the ovaries. COMPARISON:  None. FINDINGS: Uterus Measurements: 6.1 x 3.3 x 4.6 cm. No fibroids or other mass visualized. Endometrium Thickness: 2 mm in thickness. Fluid within the endometrial canal. No focal abnormality visualized. Right ovary Measurements: 4.2 x 2.0 x 3.2 cm. Multiple small follicles. No adnexal mass. Left ovary Measurements: 4.5 x 1.4 x 4.2 cm. Multiple small follicles. No adnexal mass. Pulsed  Doppler evaluation of both ovaries demonstrates normal low-resistance arterial and venous waveforms. Other findings No abnormal free fluid. IMPRESSION: No acute findings.  No evidence of ovarian torsion. Electronically Signed   By: Rolm Baptise M.D.   On: 12/24/2015 09:51   US Pelvis Complete  Result Date: 12/24/2015 CLINICAL DATA:  Right adnexal pain EXAM: TRANSABDOMINAL AND TRANSVAGINAL ULTRASOUND OF PELVIS DOPPLER ULTRASOUND OF OVARIES TECHNIQUE: Both transabdominal and transvaginal ultrasound examinations of the pelvis were performed. Transabdominal technique was performed for global imaging of the pelvis including uterus, ovaries, adnexal regions, and pelvic cul-de-sac. It was necessary to proceed with endovaginal exam following the transabdominal exam to visualize the ovaries. Color and duplex Doppler ultrasound was utilized to evaluate blood flow to the ovaries. COMPARISON:  None. FINDINGS: Uterus Measurements: 6.1 x 3.3 x 4.6 cm. No fibroids or other mass visualized. Endometrium Thickness: 2 mm in thickness. Fluid within the endometrial canal. No focal abnormality visualized. Right ovary Measurements: 4.2 x 2.0 x 3.2 cm. Multiple small  follicles. No adnexal mass. Left ovary Measurements: 4.5 x 1.4 x 4.2 cm. Multiple small follicles. No adnexal mass. Pulsed Doppler evaluation of both ovaries demonstrates normal low-resistance arterial and venous waveforms. Other findings No abnormal free fluid. IMPRESSION: No acute findings.  No evidence of ovarian torsion. Electronically Signed   By: Rolm Baptise M.D.   On: 12/24/2015 09:51   [place] Art/ven Flow Abd Pelv Doppler  Result Date: 12/24/2015 CLINICAL DATA:  Right adnexal pain EXAM: TRANSABDOMINAL AND TRANSVAGINAL ULTRASOUND OF PELVIS DOPPLER ULTRASOUND OF OVARIES TECHNIQUE: Both transabdominal and transvaginal ultrasound examinations of the pelvis were performed. Transabdominal technique was performed for global imaging of the pelvis including uterus, ovaries, adnexal regions, and pelvic cul-de-sac. It was necessary to proceed with endovaginal exam following the transabdominal exam to visualize the ovaries. Color and duplex Doppler ultrasound was utilized to evaluate blood flow to the ovaries. COMPARISON:  None. FINDINGS: Uterus Measurements: 6.1 x 3.3 x 4.6 cm. No fibroids or other mass visualized. Endometrium Thickness: 2 mm in thickness. Fluid within the endometrial canal. No focal abnormality visualized. Right ovary Measurements: 4.2 x 2.0 x 3.2 cm. Multiple small follicles. No adnexal mass. Left ovary Measurements: 4.5 x 1.4 x 4.2 cm. Multiple small follicles. No adnexal mass. Pulsed Doppler evaluation of both ovaries demonstrates normal low-resistance arterial and venous waveforms. Other findings No abnormal free fluid. IMPRESSION: No acute findings.  No evidence of ovarian torsion. Electronically Signed   By: Rolm Baptise M.D.   On: 12/24/2015 09:51   Initial Impression / Assessment and Plan / ED Course  I have reviewed the triage vital signs and the nursing notes.  Pertinent labs & imaging results that were available during my care of the patient were reviewed by me and considered in my  medical decision making (see chart for details).  Clinical Course  Safe sex encouraged. Clinically patient with cervicitis. Referral [place]    Final Clinical Impressions(s) / ED Diagnoses  Diagnosis #1 cervicitis  #2 hyperglycemia #3 hypokalemia  Final diagnoses:  None    New Prescriptions New Prescriptions   No medications on file     Orlie Dakin, MD 01/14/16 1205    Orlie Dakin, MD 01/14/16 1207

## 2016-01-14 NOTE — ED Triage Notes (Signed)
Pt states "I think I have a UTI, and I have vaginal discharge with odor, white discharge"

## 2016-01-14 NOTE — Discharge Instructions (Signed)
Use a condom each time you have sex. Your blood sugar today is mildly elevated at 168. Contact the [facility] to get a primary care physician. The wellness Center has walkin hours on Thursdays.

## 2016-01-15 LAB — GC/CHLAMYDIA PROBE AMP (~~LOC~~) NOT AT ARMC
CHLAMYDIA, DNA PROBE: NEGATIVE
Neisseria Gonorrhea: NEGATIVE

## 2016-01-18 IMAGING — US US ABDOMEN LIMITED
1 series · 14 of 25 positions shown · non-contrast
Comparison: None.

CLINICAL DATA: Initial evaluation for acute abdominal pain, nausea,
vomiting.

EXAM:
US ABDOMEN LIMITED - RIGHT UPPER QUADRANT

[Series 1: us abdomen limited · 0.17mm/px · 14 of 46 slices shown]
[im 1/46]
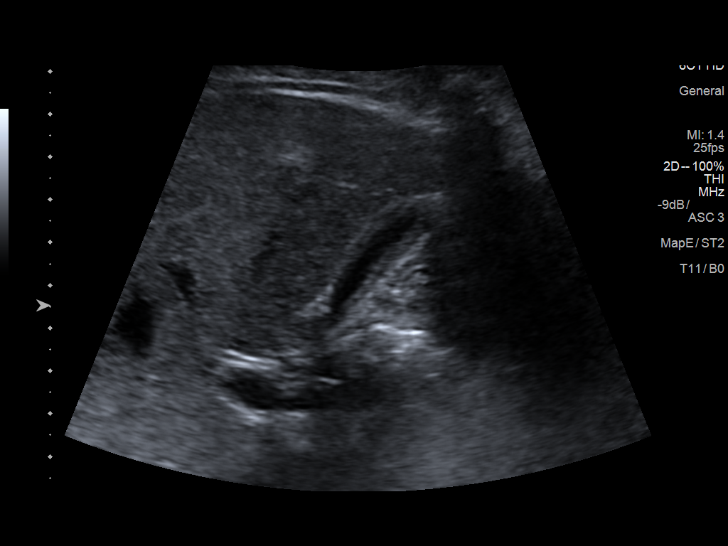
[im 4/46]
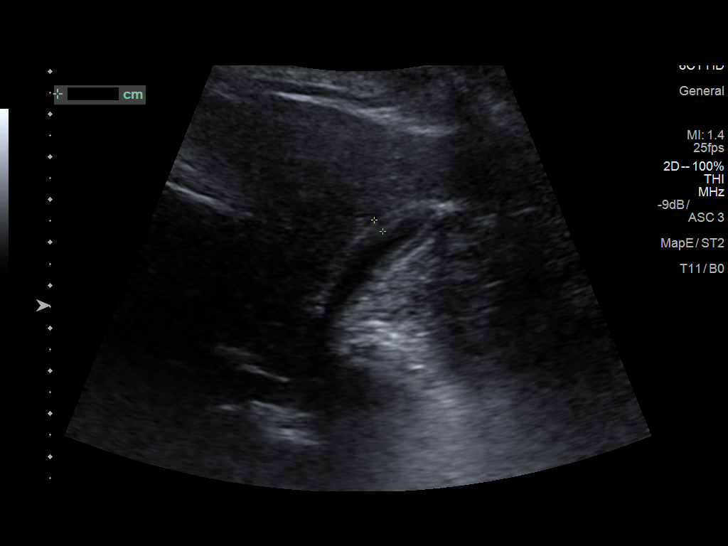
[im 8/46]
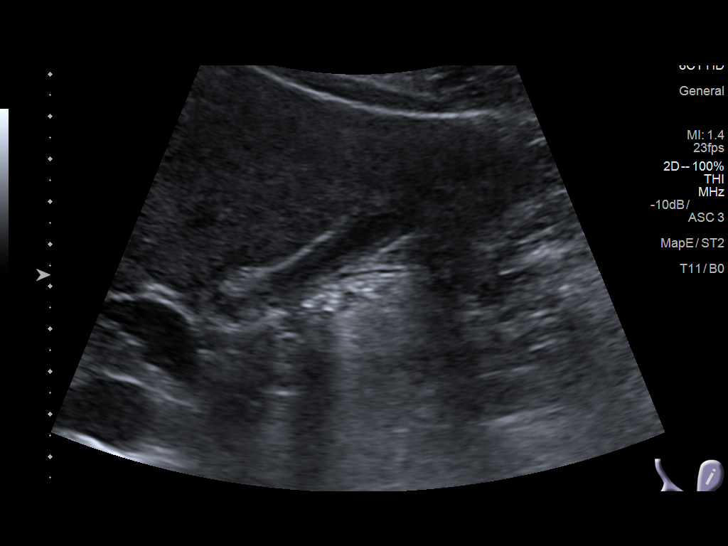
[im 12/46]
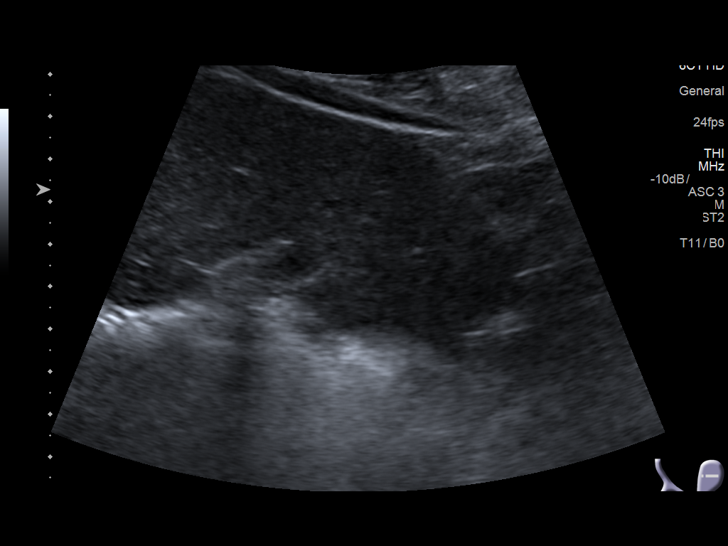
[im 16/46]
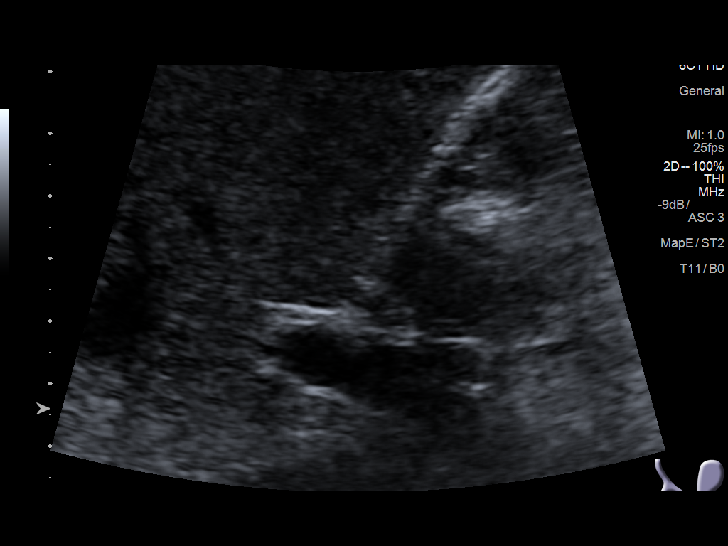
[im 17/46]
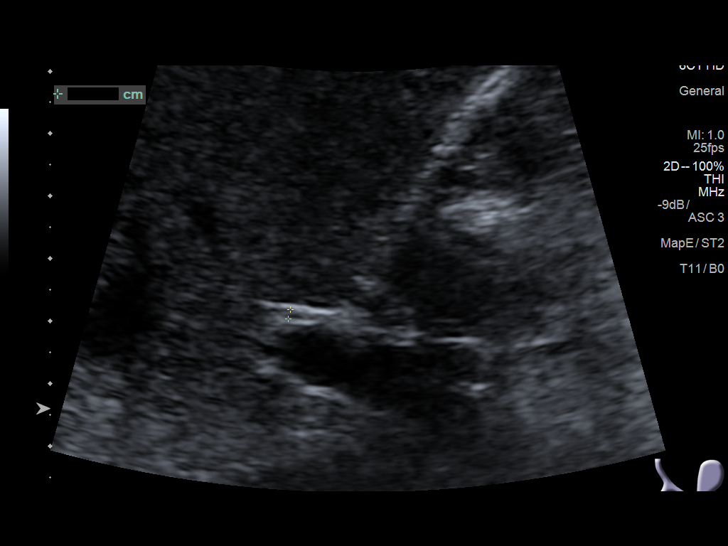
[im 21/46]
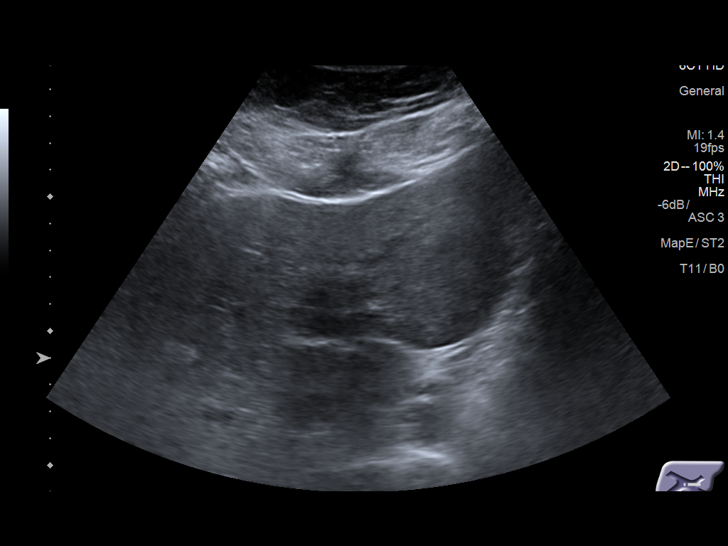
[im 25/46]
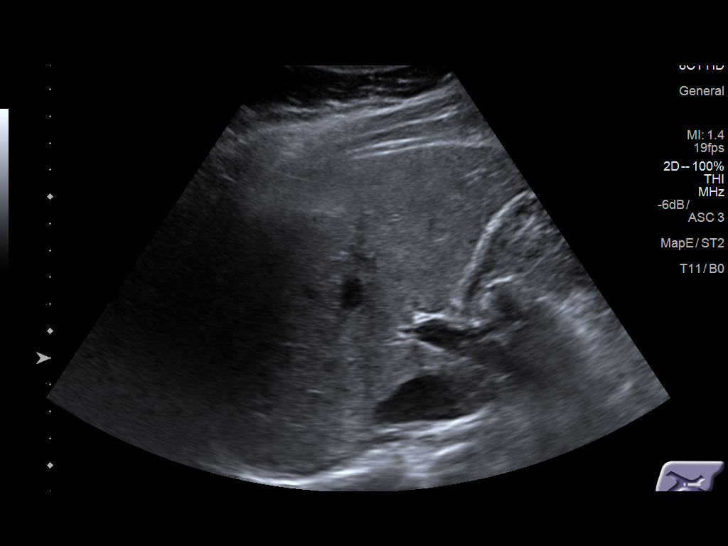
[im 29/46]
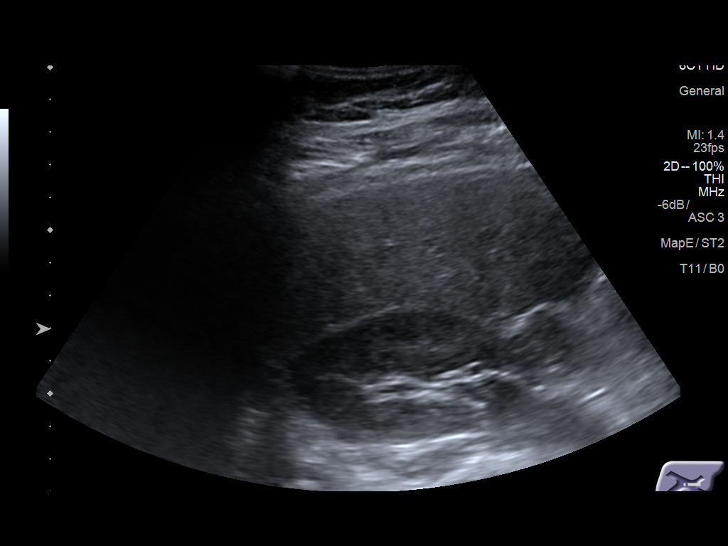
[im 31/46]
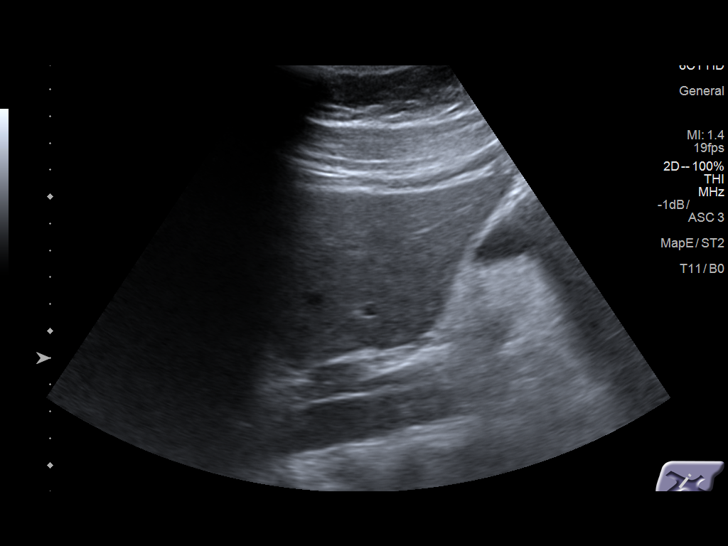
[im 34/46]
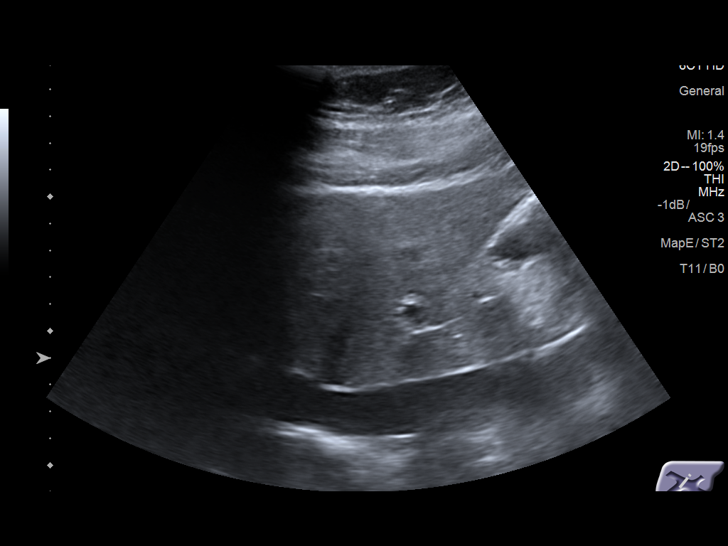
[im 38/46]
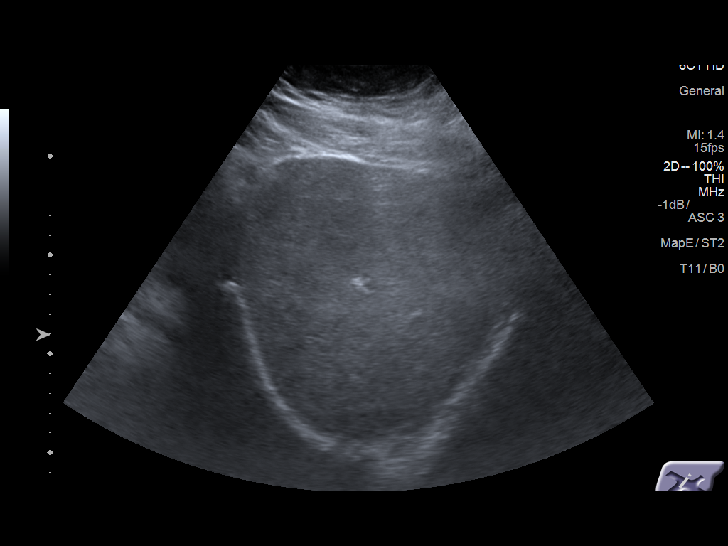
[im 42/46]
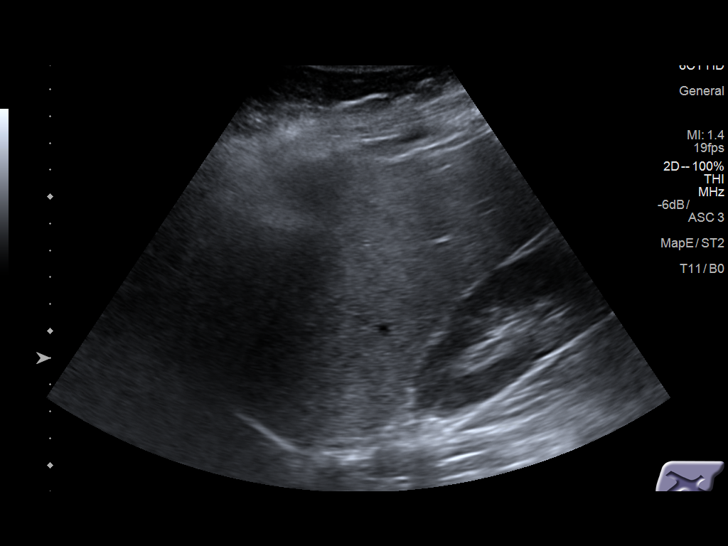
[im 46/46]
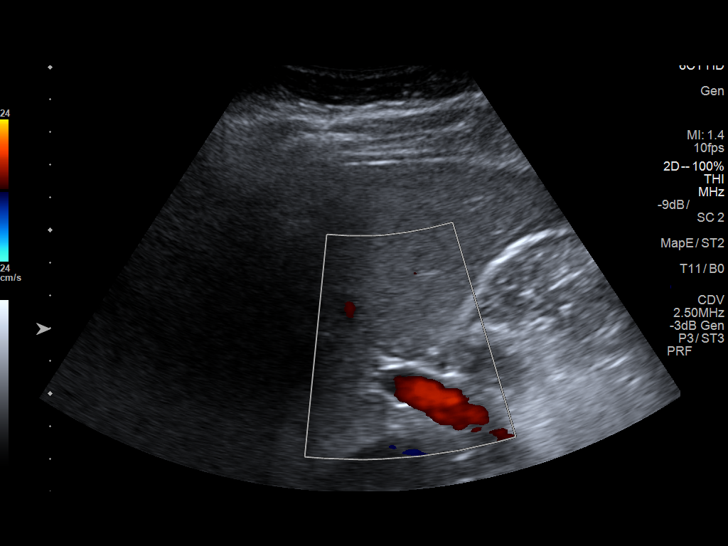

[14 of 25 positions shown; findings below may reference images not displayed]

FINDINGS: Gallbladder:

Gallbladder is contracted, somewhat limiting evaluation. Despite
this, gallbladder wall measured within normal limits at 3 mm. No
stones identified. No free pericholecystic fluid. No sonographic
Murphy sign elicited on exam.

Common bile duct:

Diameter: 2 mm.

Liver:

No focal lesion identified. Liver demonstrated somewhat dense
heterogeneous echotexture.
IMPRESSION: 1. Contracted gallbladder with no sonographic evidence for
cholelithiasis, acute cholecystitis, or biliary dilatation.
2. Hepatic steatosis.

## 2016-02-03 ENCOUNTER — Encounter: Payer: Self-pay | Admitting: Obstetrics & Gynecology

## 2016-03-12 ENCOUNTER — Ambulatory Visit: Payer: Self-pay | Admitting: Obstetrics & Gynecology

## 2016-04-06 ENCOUNTER — Emergency Department (HOSPITAL_COMMUNITY)
Admission: EM | Admit: 2016-04-06 | Discharge: 2016-04-07 | Disposition: A | Discharge status: Other Acute Inpatient Hospital | Payer: Federal, State, Local not specified - Other | Attending: Emergency Medicine | Admitting: Emergency Medicine

## 2016-04-06 DIAGNOSIS — F314 Bipolar disorder, current episode depressed, severe, without psychotic features: Secondary | ICD-10-CM | POA: Insufficient documentation

## 2016-04-06 DIAGNOSIS — Z79899 Other long term (current) drug therapy: Secondary | ICD-10-CM | POA: Insufficient documentation

## 2016-04-06 DIAGNOSIS — I10 Essential (primary) hypertension: Secondary | ICD-10-CM | POA: Insufficient documentation

## 2016-04-06 LAB — COMPREHENSIVE METABOLIC PANEL
ALK PHOS: 65 U/L (ref 38–126)
ALT: 36 U/L (ref 14–54)
ANION GAP: 6 (ref 5–15)
AST: 31 U/L (ref 15–41)
Albumin: 4.2 g/dL (ref 3.5–5.0)
BILIRUBIN TOTAL: 0.3 mg/dL (ref 0.3–1.2)
BUN: 7 mg/dL (ref 6–20)
CALCIUM: 9.3 mg/dL (ref 8.9–10.3)
CO2: 27 mmol/L (ref 22–32)
CREATININE: 1.09 mg/dL — AB (ref 0.44–1.00)
Chloride: 104 mmol/L (ref 101–111)
GFR calc non Af Amer: >60 mL/min (ref >60)
Glucose, Bld: 107 mg/dL — ABNORMAL HIGH (ref 65–99)
Potassium: 3.9 mmol/L (ref 3.5–5.1)
SODIUM: 137 mmol/L (ref 135–145)
TOTAL PROTEIN: 7.8 g/dL (ref 6.5–8.1)

## 2016-04-06 LAB — RAPID URINE DRUG SCREEN, HOSP PERFORMED
Amphetamines: NOT DETECTED
BARBITURATES: NOT DETECTED
Benzodiazepines: NOT DETECTED
Cocaine: NOT DETECTED
OPIATES: NOT DETECTED
TETRAHYDROCANNABINOL: NOT DETECTED

## 2016-04-06 LAB — CBC
HCT: 40.8 % (ref 36.0–46.0)
HEMOGLOBIN: 12.7 g/dL (ref 12.0–15.0)
MCH: 25.1 pg — AB (ref 26.0–34.0)
MCHC: 31.1 g/dL (ref 30.0–36.0)
MCV: 80.8 fL (ref 78.0–100.0)
Platelets: 255 10*3/uL (ref 150–400)
RBC: 5.05 MIL/uL (ref 3.87–5.11)
RDW: 13.1 % (ref 11.5–15.5)
WBC: 7.1 10*3/uL (ref 4.0–10.5)

## 2016-04-06 LAB — ETHANOL: Alcohol, Ethyl (B): <5 mg/dL (ref <5)

## 2016-04-06 LAB — PREGNANCY, URINE: Preg Test, Ur: NEGATIVE

## 2016-04-06 LAB — ACETAMINOPHEN LEVEL: Acetaminophen (Tylenol), Serum: <10 ug/mL — ABNORMAL LOW (ref 10–30)

## 2016-04-06 LAB — SALICYLATE LEVEL

## 2016-04-06 MED ORDER — ONDANSETRON HCL 4 MG PO TABS: 4.0000 mg | ORAL_TABLET | Freq: Three times a day (TID) | ORAL | Status: DC | PRN

## 2016-04-06 MED ORDER — NICOTINE 21 MG/24HR TD PT24: 21.0000 mg | MEDICATED_PATCH | Freq: Every day | TRANSDERMAL | Status: DC

## 2016-04-06 MED ORDER — ALUM & MAG HYDROXIDE-SIMETH 200-200-20 MG/5ML PO SUSP: 30.0000 mL | ORAL | Status: DC | PRN

## 2016-04-06 NOTE — BH Assessment (Signed)
Kathleen [name], [facility] at [facility], confirmed adult unit is at capacity. Faxed clinical information to the following facilities for placement:  [facility], [facility], [facility], [facility] Triage Specialist [phone]
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
## 2016-04-06 NOTE — ED Notes (Signed)
Bed: [facility] Expected date:  Expected time:  Means of arrival:  Comments: Triage 3

## 2016-04-06 NOTE — ED Notes (Signed)
D: Patient observed in room watching TV on approach. Pt Denies SI/HI/VH and pain.pt reports no AV "at the moment". Pt reports goal to be admitted to [facility] and get medication stabilized. No behavioral issues noted.  A: Support and encouragement offered as needed.  R: Patient safe and cooperative. Will continue to monitor patient for safety and stability.

## 2016-04-06 NOTE — ED Triage Notes (Signed)
Pt reports SI, hasn't slept in 72hrs and has taken x18 benadryl tablets in 3 days. Pt recently seen at [facility] this past Thursday.   EMS Vitals   BP 130/98 RR 18 HR 100 SPO2 100

## 2016-04-06 NOTE — ED Notes (Signed)
Pt has been wanded by security. 

## 2016-04-06 NOTE — ED Provider Notes (Signed)
[facility] [facility] DEPT Provider Note   CSN: [id number] Arrival date & time: 04/06/16  1116     History   Chief Complaint Chief Complaint  Patient presents with  . Suicidal    HPI Kathleen [name] is a 25 y.o. female.  25 year old female with history of bipolar disorder who presents after ingesting 4 tablets of ibuprofen 4 tablets of 325 Tylenol over the past several days. States that she does have a history of schizophrenia also as well as suicide attempt. Prior suicide attempt was with overdose as well as she has a cutter. She has been responding to internal stimuli and has been noncompliant with her medications. She is a mental health from [place]. Denies any somatic complaints at this time. Does use marijuana but denies any alcohol use. Symptoms have been progressively worse in the last 3 days. Denies any new stressors.      Past Medical History:  Diagnosis Date  . Bipolar 1 disorder ([place])   . Depression   . Headache(784.0)   . Hypertension   . Schizophrenia ([place])   . STD (female)     Patient Active Problem List   Diagnosis Date Noted  . Cervicitis 01/14/2016  . Schizoaffective disorder ([place]) 11/08/2012    Past Surgical History:  Procedure Laterality Date  . BUNIONECTOMY     both feet    OB History    No data available       Home Medications    Prior to Admission medications   Medication Sig Start Date End Date Taking? Authorizing Provider  diphenhydrAMINE (SOMINEX) 25 MG tablet Take 25 mg by mouth at bedtime as needed for allergies or sleep.    Historical Provider, MD    Family History Family History  Problem Relation Age of Onset  . Diabetes Mother   . Hypertension Mother     Social History Social History  Substance Use Topics  . Smoking status: Never Smoker  . Smokeless tobacco: Never Used  . Alcohol use No     Allergies   Other   Review of Systems Review of Systems  All other systems reviewed and are negative.    Physical  Exam Updated Vital Signs BP 122/96 (BP Location: Left Arm)   Pulse 102   Temp 97.9 F (36.6 C) (Oral)   SpO2 100%   Physical Exam  Constitutional: She is oriented to person, place, and time. She appears well-developed and well-nourished.  Non-toxic appearance. No distress.  HENT:  Head: Normocephalic and atraumatic.  Eyes: Conjunctivae, EOM and lids are normal. Pupils are equal, round, and reactive to light.  Neck: Normal range of motion. Neck supple. No tracheal deviation present. No thyroid mass present.  Cardiovascular: Normal rate, regular rhythm and normal heart sounds.  Exam reveals no gallop.   No murmur heard. Pulmonary/Chest: Effort normal and breath sounds normal. No stridor. No respiratory distress. She has no decreased breath sounds. She has no wheezes. She has no rhonchi. She has no rales.  Abdominal: Soft. Normal appearance and bowel sounds are normal. She exhibits no distension. There is no tenderness. There is no rebound and no CVA tenderness.  Musculoskeletal: Normal range of motion. She exhibits no edema or tenderness.  Neurological: She is alert and oriented to person, place, and time. She has normal strength. No cranial nerve deficit or sensory deficit. GCS eye subscore is 4. GCS verbal subscore is 5. GCS motor subscore is 6.  Skin: Skin is warm and dry. No abrasion and no rash noted.  Psychiatric: Her affect is blunt. Her speech is delayed. She is withdrawn. Thought content is paranoid. She expresses suicidal ideation. She expresses suicidal plans. She expresses no homicidal plans.  Nursing note and vitals reviewed.    ED Treatments / Results  Labs (all labs ordered are listed, but only abnormal results are displayed) Labs Reviewed  COMPREHENSIVE METABOLIC PANEL  ETHANOL  SALICYLATE LEVEL  ACETAMINOPHEN LEVEL  CBC  RAPID URINE DRUG SCREEN, HOSP PERFORMED  POC URINE PREG, ED    EKG  EKG Interpretation None       Radiology No results  found.  Procedures Procedures (including critical care time)  Medications Ordered in ED Medications  nicotine (NICODERM CQ - dosed in mg/24 hours) patch 21 mg (not administered)  alum & mag hydroxide-simeth (MAALOX/MYLANTA) 200-200-20 MG/5ML suspension 30 mL (not administered)  ondansetron (ZOFRAN) tablet 4 mg (not administered)     Initial Impression / Assessment and Plan / ED Course  I have reviewed the triage vital signs and the nursing notes.  Pertinent labs & imaging results that were available during my care of the patient were reviewed by me and considered in my medical decision making (see chart for details).  Clinical Course    Clinical clearance labs are pending at this time and will follow along. Patient is medically cleared initially pending labs at this time. Will have behavior health see patient  Final Clinical Impressions(s) / ED Diagnoses   Final diagnoses:  None    New Prescriptions New Prescriptions   No medications on file     Lacretia Leigh, MD 04/06/16 1144

## 2016-04-06 NOTE — ED Notes (Signed)
Bed: [facility]PT3 Expected date:  Expected time:  Means of arrival:  Comments: 

## 2016-04-06 NOTE — ED Notes (Signed)
MD at bedside unable to collect labs at bedside

## 2016-04-07 ENCOUNTER — Encounter (HOSPITAL_COMMUNITY): Payer: Self-pay | Admitting: *Deleted

## 2016-04-07 ENCOUNTER — Inpatient Hospital Stay (HOSPITAL_COMMUNITY)
Admission: AD | Admit: 2016-04-07 | Discharge: 2016-04-10 | DRG: 885 | Disposition: A | Discharge status: Home / Self Care | Payer: Federal, State, Local not specified - Other | Source: Intra-hospital | Attending: Psychiatry | Admitting: Psychiatry

## 2016-04-07 DIAGNOSIS — Z915 Personal history of self-harm: Secondary | ICD-10-CM | POA: Diagnosis not present

## 2016-04-07 DIAGNOSIS — Z833 Family history of diabetes mellitus: Secondary | ICD-10-CM | POA: Diagnosis not present

## 2016-04-07 DIAGNOSIS — Z8249 Family history of ischemic heart disease and other diseases of the circulatory system: Secondary | ICD-10-CM

## 2016-04-07 DIAGNOSIS — Z79899 Other long term (current) drug therapy: Secondary | ICD-10-CM

## 2016-04-07 DIAGNOSIS — Z809 Family history of malignant neoplasm, unspecified: Secondary | ICD-10-CM

## 2016-04-07 DIAGNOSIS — R7303 Prediabetes: Secondary | ICD-10-CM | POA: Diagnosis present

## 2016-04-07 DIAGNOSIS — F319 Bipolar disorder, unspecified: Principal | ICD-10-CM | POA: Diagnosis present

## 2016-04-07 DIAGNOSIS — I10 Essential (primary) hypertension: Secondary | ICD-10-CM | POA: Diagnosis present

## 2016-04-07 DIAGNOSIS — R45850 Homicidal ideations: Secondary | ICD-10-CM | POA: Diagnosis present

## 2016-04-07 DIAGNOSIS — Z818 Family history of other mental and behavioral disorders: Secondary | ICD-10-CM

## 2016-04-07 DIAGNOSIS — F314 Bipolar disorder, current episode depressed, severe, without psychotic features: Secondary | ICD-10-CM | POA: Diagnosis not present

## 2016-04-07 DIAGNOSIS — F3130 Bipolar disorder, current episode depressed, mild or moderate severity, unspecified: Secondary | ICD-10-CM | POA: Diagnosis present

## 2016-04-07 DIAGNOSIS — R45851 Suicidal ideations: Secondary | ICD-10-CM | POA: Diagnosis present

## 2016-04-07 DIAGNOSIS — F331 Major depressive disorder, recurrent, moderate: Secondary | ICD-10-CM | POA: Diagnosis not present

## 2016-04-07 MED ORDER — INFLUENZA VAC SPLIT QUAD 0.5 ML IM SUSY
0.5000 mL | PREFILLED_SYRINGE | INTRAMUSCULAR | Status: AC
  Administered 2016-04-08: 0.5 mL via INTRAMUSCULAR
  Filled 2016-04-07: qty 0.5

## 2016-04-07 MED ORDER — MAGNESIUM HYDROXIDE 400 MG/5ML PO SUSP: 30.0000 mL | Freq: Every day | ORAL | Status: DC | PRN

## 2016-04-07 MED ORDER — ACETAMINOPHEN 325 MG PO TABS: 650.0000 mg | ORAL_TABLET | Freq: Four times a day (QID) | ORAL | Status: DC | PRN

## 2016-04-07 MED ORDER — HYDROXYZINE HCL 50 MG PO TABS
50.0000 mg | ORAL_TABLET | Freq: Every evening | ORAL | Status: DC | PRN
  Administered 2016-04-07 – 2016-04-09 (×4): 50 mg via ORAL
  Filled 2016-04-07: qty 1
  Filled 2016-04-07: qty 6
  Filled 2016-04-07 (×3): qty 1

## 2016-04-07 NOTE — BH Assessment (Signed)
Reading Assessment Progress Note  Per Corena Pilgrim, MD, this pt requires psychiatric hospitalization at this time.  Ria Comment, RN, [facility] has assigned pt to [facility] Rm [id number]; they will be ready to receive pt at 11:30.  Pt has signed Voluntary Admission and Consent for Treatment, as well as Consent to Release Information to [facility], and a notification call has been placed.  Signed forms have been faxed to [facility].  Pt's nurse, Diane, has been notified, and agrees to send original paperwork along with pt via [facility], and to call report to [phone].  Jalene Mullet, [facility] Triage Specialist [phone]

## 2016-04-07 NOTE — Progress Notes (Signed)
The patient attended this evening's A.A.meeting and was appropriate.  
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
## 2016-04-07 NOTE — Tx Team (Signed)
Initial Treatment Plan 04/07/2016 3:03 PM Kathleen [name] [id number]    PATIENT STRESSORS: Loss of best friend (recently passed) Marital or family conflict Medication change or noncompliance Other: housing (father will not let patient return)   PATIENT STRENGTHS: Average or above average intelligence Communication skills General fund of knowledge Motivation for treatment/growth Physical Health Supportive family/friends Work skills   PATIENT IDENTIFIED PROBLEMS: "To get better."  "To get meds straight."  "To be around positive people."                 DISCHARGE CRITERIA:  Improved stabilization in mood, thinking, and/or behavior Need for constant or close observation no longer present Reduction of life-threatening or endangering symptoms to within safe limits Verbal commitment to aftercare and medication compliance  PRELIMINARY DISCHARGE PLAN: Attend aftercare/continuing care group Outpatient therapy  PATIENT/FAMILY INVOLVEMENT: This treatment plan has been presented to and reviewed with the patient, Kathleen [name], and/or family member.  The patient and family have been given the opportunity to ask questions and make suggestions.  Jamie Kato, RN 04/07/2016, 3:03 PM

## 2016-04-07 NOTE — ED Notes (Signed)
Affect blunted, mood depressed. Pt said that she has taken up to 18 benadryl because of needing to sleep. Pt transported to [facility]: [facility] by [place] transportation. All belongings returned to pt who signed for same.

## 2016-04-07 NOTE — Consult Note (Signed)
[facility] Psychiat[facility] Consult   Reason for Consult:  Intentional overdose Referring Physician:  EDP Patient Identification: Kathleen [name] MRN:  [id number] Principal Diagnosis: Bipolar affective disorder, depressed, severe ([place]) Diagnosis:   Patient Active Problem List   Diagnosis Date Noted  . Bipolar affective disorder, depressed, severe ([place]) [F31.4] 04/07/2016    Priority: High  . Cervicitis [N72] 01/14/2016    Total Time spent with patient: 45 minutes  Subjective:   Kathleen [name] is a 25 y.o. female patient admitted with suicide attempt.  HPI:  25 yo female who presented to the ED with suicide attempt by overdose.  Today, she has feeling of hopelessness, worthlessness, and helplessness.  REcently evicted from her father's house but plans to move in with her exboyfriend in [place].  No homicidal ideations or hallucinations.  Past Psychiatric History: bipolar disorder  Risk to Self: Suicidal Ideation: Yes-Currently Present Suicidal Intent: Yes-Currently Present Is patient at risk for suicide?: Yes Suicidal Plan?: Yes-Currently Present Specify Current Suicidal Plan: to overdose Access to Means: Yes Specify Access to Suicidal Means: access to pills What has been your use of drugs/alcohol within the last 12 months?: NA How many times?: 2 Other Self Harm Risks: NA Triggers for Past Attempts: Family contact Intentional Self Injurious Behavior: None Risk to Others: Homicidal Ideation: No Thoughts of Harm to Others: No Current Homicidal Intent: No Current Homicidal Plan: No Access to Homicidal Means: No Identified Victim: NA History of harm to others?: No Assessment of Violence: None Noted Violent Behavior Description: NA Does patient have access to weapons?: No Criminal Charges Pending?: No Does patient have a court date: No Prior Inpatient Therapy: Prior Inpatient Therapy: Yes Prior Therapy Dates: 2008 and 2014 Prior Therapy Facilty/Provider(s):  [facility] Reason for Treatment: depression and SI Prior Outpatient Therapy: Prior Outpatient Therapy: No Prior Therapy Dates: NA Prior Therapy Facilty/Provider(s): NA Reason for Treatment: nA Does patient have an ACCT team?: No Does patient have Intensive In-House Services?  : No Does patient have [facility] services? : No Does patient have P4CC services?: No  Past Medical History:  Past Medical History:  Diagnosis Date  . Bipolar 1 disorder ([place])   . Depression   . Headache(784.0)   . Hypertension   . Schizophrenia ([place])   . STD (female)     Past Surgical History:  Procedure Laterality Date  . BUNIONECTOMY     both feet   Family History:  Family History  Problem Relation Age of Onset  . Diabetes Mother   . Hypertension Mother    Family Psychiatric  History: none Social History:  History  Alcohol Use No     History  Drug Use No    Social History   Social History  . Marital status: Single    Spouse name: N/A  . Number of children: N/A  . Years of education: N/A   Social History Main Topics  . Smoking status: Never Smoker  . Smokeless tobacco: Never Used  . Alcohol use No  . Drug use: No  . Sexual activity: Yes    Birth control/ protection: None   Other Topics Concern  . Not on file   Social History Narrative  . No narrative on file   Additional Social History:    Allergies:   Allergies  Allergen Reactions  . Other     Unknown antibiotic that caused itching and rash    Labs:  Results for orders placed or performed during the hospital encounter of 04/06/16 (from the  past 48 hour(s))  Comprehensive metabolic panel     Status: Abnormal   Collection Time: 04/06/16 11:59 AM  Result Value Ref Range   Sodium 137 135 - 145 mmol/L   Potassium 3.9 3.5 - 5.1 mmol/L   Chloride 104 101 - 111 mmol/L   CO2 27 22 - 32 mmol/L   Glucose, Bld 107 (H) 65 - 99 mg/dL   BUN 7 6 - 20 mg/dL   Creatinine, Ser 1.09 (H) 0.44 - 1.00 mg/dL   Calcium 9.3 8.9 - 10.3 mg/dL    Total Protein 7.8 6.5 - 8.1 g/dL   Albumin 4.2 3.5 - 5.0 g/dL   AST 31 15 - 41 U/L   ALT 36 14 - 54 U/L   Alkaline Phosphatase 65 38 - 126 U/L   Total Bilirubin 0.3 0.3 - 1.2 mg/dL   GFR calc non Af Amer >60 >60 mL/min   GFR calc Af Amer >60 >60 mL/min    Comment: (NOTE) The eGFR has been calculated using the CKD EPI equation. This calculation has not been validated in all clinical situations. eGFR's persistently <60 mL/min signify possible Chronic Kidney Disease.    Anion gap 6 5 - 15  cbc     Status: Abnormal   Collection Time: 04/06/16 11:59 AM  Result Value Ref Range   WBC 7.1 4.0 - 10.5 K/uL   RBC 5.05 3.87 - 5.11 MIL/uL   Hemoglobin 12.7 12.0 - 15.0 g/dL   HCT 40.8 36.0 - 46.0 %   MCV 80.8 78.0 - 100.0 fL   MCH 25.1 (L) 26.0 - 34.0 pg   MCHC 31.1 30.0 - 36.0 g/dL   RDW 13.1 11.5 - 15.5 %   Platelets 255 150 - 400 K/uL  Ethanol     Status: None   Collection Time: 04/06/16 12:00 PM  Result Value Ref Range   Alcohol, Ethyl (B) <5 <5 mg/dL    Comment:        LOWEST DETECTABLE LIMIT FOR SERUM ALCOHOL IS 5 mg/dL FOR MEDICAL PURPOSES ONLY   Salicylate level     Status: None   Collection Time: 04/06/16 12:00 PM  Result Value Ref Range   Salicylate Lvl <2.0 2.8 - 30.0 mg/dL  Acetaminophen level     Status: Abnormal   Collection Time: 04/06/16 12:00 PM  Result Value Ref Range   Acetaminophen (Tylenol), Serum <10 (L) 10 - 30 ug/mL    Comment:        THERAPEUTIC CONCENTRATIONS VARY SIGNIFICANTLY. A RANGE OF 10-30 ug/mL MAY BE AN EFFECTIVE CONCENTRATION FOR MANY PATIENTS. HOWEVER, SOME ARE BEST TREATED AT CONCENTRATIONS OUTSIDE THIS RANGE. ACETAMINOPHEN CONCENTRATIONS >150 ug/mL AT 4 HOURS AFTER INGESTION AND >50 ug/mL AT 12 HOURS AFTER INGESTION ARE OFTEN ASSOCIATED WITH TOXIC REACTIONS.   Rapid urine drug screen (hospital performed)     Status: None   Collection Time: 04/06/16  8:00 PM  Result Value Ref Range   Opiates NONE DETECTED NONE DETECTED   Cocaine  NONE DETECTED NONE DETECTED   Benzodiazepines NONE DETECTED NONE DETECTED   Amphetamines NONE DETECTED NONE DETECTED   Tetrahydrocannabinol NONE DETECTED NONE DETECTED   Barbiturates NONE DETECTED NONE DETECTED    Comment:        DRUG SCREEN FOR MEDICAL PURPOSES ONLY.  IF CONFIRMATION IS NEEDED FOR ANY PURPOSE, NOTIFY LAB WITHIN 5 DAYS.        LOWEST DETECTABLE LIMITS FOR URINE DRUG SCREEN Drug Class       Cutoff (ng/mL)  Amphetamine      1000 Barbiturate      200 Benzodiazepine   948 Tricyclics       016 Opiates          300 Cocaine          300 THC              50   Pregnancy, urine     Status: None   Collection Time: 04/06/16  8:00 PM  Result Value Ref Range   Preg Test, Ur NEGATIVE NEGATIVE    Comment:        THE SENSITIVITY OF THIS METHODOLOGY IS >20 mIU/mL.     Current Facility-Administered Medications  Medication Dose Route Frequency Provider Last Rate Last Dose  . alum & mag hydroxide-simeth (MAALOX/MYLANTA) 200-200-20 MG/5ML suspension 30 mL  30 mL Oral PRN Lacretia Leigh, MD      . nicotine (NICODERM CQ - dosed in mg/24 hours) patch 21 mg  21 mg Transdermal Daily Lacretia Leigh, MD      . ondansetron [facility]) tablet 4 mg  4 mg Oral Q8H PRN Lacretia Leigh, MD       Current Outpatient Prescriptions  Medication Sig Dispense Refill  . diphenhydrAMINE (SOMINEX) 25 MG tablet Take 100 mg by mouth daily as needed for allergies or sleep.     . hydrocortisone cream 1 % Apply 1 application topically 2 (two) times daily as needed for itching.    Marland Kitchen ibuprofen (ADVIL,MOTRIN) 200 MG tablet Take 200 mg by mouth every 6 (six) hours as needed for moderate pain.      Musculoskeletal: Strength & Muscle Tone: within normal limits Gait & Station: normal Patient leans: N/A  Psychiatric Specialty Exam: Physical Exam  Constitutional: She is oriented to person, place, and time. She appears well-developed and well-nourished.  HENT:  Head: Normocephalic.  Neck: Normal range of motion.   Respiratory: Effort normal.  Musculoskeletal: Normal range of motion.  Neurological: She is alert and oriented to person, place, and time.  Skin: Skin is warm and dry.  Psychiatric: Her speech is normal and behavior is normal. Cognition and memory are normal. She expresses impulsivity. She exhibits a depressed mood. She expresses suicidal ideation. She expresses suicidal plans.    Review of Systems  Constitutional: Negative.   HENT: Negative.   Eyes: Negative.   Respiratory: Negative.   Cardiovascular: Negative.   Gastrointestinal: Negative.   Genitourinary: Negative.   Musculoskeletal: Negative.   Skin: Negative.   Neurological: Negative.   Endo/Heme/Allergies: Negative.   Psychiatric/Behavioral: Positive for depression and suicidal ideas.    Blood pressure 108/55, pulse 85, temperature 98.4 F (36.9 C), temperature source Oral, resp. rate 17, SpO2 100 %.There is no height or weight on file to calculate BMI.  General Appearance: Disheveled  Eye Contact:  Fair  Speech:  Normal Rate  Volume:  Decreased  Mood:  Depressed  Affect:  Congruent  Thought Process:  Coherent and Descriptions of Associations: Intact  Orientation:  Full (Time, Place, and Person)  Thought Content:  Rumination  Suicidal Thoughts:  Yes.  with intent/plan  Homicidal Thoughts:  No  Memory:  Immediate;   Fair Recent;   Fair Remote;   Fair  Judgement:  Poor  Insight:  Fair  Psychomotor Activity:  Decreased  Concentration:  Concentration: Fair and Attention Span: Fair  Recall:  [employer] of Knowledge:  Fair  Language:  Good  Akathisia:  No  Handed:  Right  AIMS (if indicated):  Assets:  Leisure Time Physical Health Resilience  ADL's:  Intact  Cognition:  WNL  Sleep:        Treatment Plan Summary: Daily contact with patient to assess and evaluate symptoms and progress in treatment, Medication management and Plan bipolar affective disorder, depressed, severe without psychosis:  -Crisis  stabilization -Medication management:  Medications not restarted due to overdose, needs to clear her overdose medications -Individual counseling  Disposition: Recommend psychiatric Inpatient admission when medically cleared.  Waylan Boga, NP 04/07/2016 10:48 AM  Patient seen face-to-face for psychiatric evaluation, chart reviewed and case discussed with the physician extender and developed treatment plan. Reviewed the information documented and agree with the treatment plan. Corena Pilgrim, MD
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
## 2016-04-08 DIAGNOSIS — F331 Major depressive disorder, recurrent, moderate: Secondary | ICD-10-CM

## 2016-04-08 MED ORDER — MIRTAZAPINE 15 MG PO TABS
15.0000 mg | ORAL_TABLET | Freq: Every day | ORAL | Status: DC
  Administered 2016-04-08 – 2016-04-09 (×2): 15 mg via ORAL
  Filled 2016-04-08 (×3): qty 1
  Filled 2016-04-08: qty 7
  Filled 2016-04-08: qty 1

## 2016-04-08 NOTE — BHH Group Notes (Signed)
[place] LCSW Group Therapy  04/08/2016 1:29 PM  Type of Therapy:  Group Therapy  Participation Level:  Did Not Attend-pt chose to remain in bed.  Summary of Progress/Problems: MHA Speaker came to talk about his personal journey with substance abuse and addiction. The pt processed ways by which to relate to the speaker. Cataract speaker provided handouts and educational information pertaining to groups and services offered by the [facility].   Kathleen [name] N Smart LCSW 04/08/2016, 1:29 PM

## 2016-04-08 NOTE — Progress Notes (Signed)
D: Pt was in the day room upon initial approach.  Pt presents with depressed affect and mood.  She reports her goal was to "talk to my boyfriend about coming up there."  Pt reports she accomplished her goal.  Pt denies SI/HI, denies hallucinations, denies pain.  Pt has been visible in milieu interacting with peers and staff appropriately.  Pt attended evening group.   A: Actively listened to pt and offered support and encouragement. Medications administered per order.  PRN medication administered for sleep. R: Pt is safe on the unit.  Pt is compliant with medications.  Pt verbally contracts for safety.  Will continue to monitor and assess.

## 2016-04-08 NOTE — BHH Group Notes (Signed)
[place] Group Notes:  (Nursing/MHT/Case Management/Adjunct)  Date:  04/08/2016  Time:  0915  Type of Therapy:  Nurse Education  Participation Level:  Active  Participation Quality:  Appropriate  Affect:  Appropriate  Cognitive:  Appropriate  Insight:  Appropriate  Engagement in Group:  Engaged  Modes of Intervention:  Discussion, Education and Support  Summary of Progress/Problems: Pt listened attentively during group and shared that she hoped to get help.  Marya Landry 04/08/2016, 10:21 AM
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
## 2016-04-08 NOTE — Plan of Care (Signed)
Problem: Medication: Goal: Compliance with prescribed medication regimen will improve Outcome: Progressing Pt has been compliant with scheduled medication tonight.

## 2016-04-08 NOTE — Progress Notes (Signed)
Recreation Therapy Notes   Animal-Assisted Activity (AAA) Program Checklist/Progress Notes Patient Eligibility Criteria Checklist & Daily Group note for Rec TxIntervention  Date: 10.24.2017 Time: 2:45pm Location: 4 00 Hall Dayroom   AAA/T Program Assumption of Risk Form signed by Patient/ or Parent Legal Guardian Yes  Patient is free of allergies or sever asthma Yes  Patient reports no fear of animals Yes  Patient reports no history of cruelty to animals Yes  Patient understands his/her participation is voluntary Yes  Behavioral Response: Did not attend.   Laureen Ochs Pansey Pinheiro, LRT/CTRS       Johnella Crumm L 04/08/2016 3:00 PM

## 2016-04-08 NOTE — Tx Team (Signed)
Interdisciplinary Treatment and Diagnostic Plan Update  04/08/2016 Time of Session: 9:30AM Kathleen [name] MRN: [id number]  Principal Diagnosis: MDD  Secondary Diagnoses: Active Problems:   Bipolar affective disorder, current episode depressed (HCC)   Current Medications:  Current Facility-Administered Medications  Medication Dose Route Frequency Provider Last Rate Last Dose  . acetaminophen (TYLENOL) tablet 650 mg  650 mg Oral Q6H PRN Patrecia Pour, NP      . hydrOXYzine (ATARAX/VISTARIL) tablet 50 mg  50 mg Oral QHS PRN,MR X 1 Laverle Hobby, PA-C   50 mg at 04/07/16 2207  . magnesium hydroxide (MILK OF MAGNESIA) suspension 30 mL  30 mL Oral Daily PRN Patrecia Pour, NP      . mirtazapine (REMERON) tablet 15 mg  15 mg Oral QHS Linard Millers, MD       PTA Medications: Prescriptions Prior to Admission  Medication Sig Dispense Refill Last Dose  . diphenhydrAMINE (SOMINEX) 25 MG tablet Take 100 mg by mouth daily as needed for allergies or sleep.    04/06/2016 at Unknown time  . hydrocortisone cream 1 % Apply 1 application topically 2 (two) times daily as needed for itching.   Past Month at Unknown time  . ibuprofen (ADVIL,MOTRIN) 200 MG tablet Take 200 mg by mouth every 6 (six) hours as needed for moderate pain.   04/06/2016 at Unknown time    Patient Stressors: Loss of best friend (recently passed) Marital or family conflict Medication change or noncompliance Other: housing (father will not let patient return)  Patient Strengths: Average or above average intelligence Communication skills General fund of knowledge Motivation for treatment/growth Physical Health Supportive family/friends Work skills  Treatment Modalities: Medication Management, Group therapy, Case management,  1 to 1 session with clinician, Psychoeducation, Recreational therapy.   Physician Treatment Plan for Primary Diagnosis: MDD Long Term Goal(s):     Short Term Goals:    Medication  Management: Evaluate patient's response, side effects, and tolerance of medication regimen.  Therapeutic Interventions: 1 to 1 sessions, Unit Group sessions and Medication administration.  Evaluation of Outcomes: Progressing  Physician Treatment Plan for Secondary Diagnosis: Active Problems:   Bipolar affective disorder, current episode depressed ([place])  Long Term Goal(s):     Short Term Goals:       Medication Management: Evaluate patient's response, side effects, and tolerance of medication regimen.  Therapeutic Interventions: 1 to 1 sessions, Unit Group sessions and Medication administration.  Evaluation of Outcomes: Progressing   RN Treatment Plan for Primary Diagnosis: MDD Long Term Goal(s): Knowledge of disease and therapeutic regimen to maintain health will improve  Short Term Goals: Ability to remain free from injury will improve, Ability to disclose and discuss suicidal ideas and Ability to identify and develop effective coping behaviors will improve  Medication Management: RN will administer medications as ordered by provider, will assess and evaluate patient's response and provide education to patient for prescribed medication. RN will report any adverse and/or side effects to prescribing provider.  Therapeutic Interventions: 1 on 1 counseling sessions, Psychoeducation, Medication administration, Evaluate responses to treatment, Monitor vital signs and CBGs as ordered, Perform/monitor CIWA, COWS, AIMS and Fall Risk screenings as ordered, Perform wound care treatments as ordered.  Evaluation of Outcomes: Progressing   LCSW Treatment Plan for Primary Diagnosis: MDD Long Term Goal(s): Safe transition to appropriate next level of care at discharge, Engage patient in therapeutic group addressing interpersonal concerns.  Short Term Goals: Engage patient in aftercare planning with referrals and resources, Increase  ability to appropriately verbalize feelings and Facilitate  patient progression through stages of change regarding substance use diagnoses and concerns  Therapeutic Interventions: Assess for all discharge needs, 1 to 1 time with Social worker, Explore available resources and support systems, Assess for adequacy in community support network, Educate family and significant other(s) on suicide prevention, Complete Psychosocial Assessment, Interpersonal group therapy.  Evaluation of Outcomes: Progressing   Progress in Treatment: Attending groups: Yes. Participating in groups: No. Minimal active group participation  Taking medication as prescribed: Yes. Toleration medication: Yes. Family/Significant other contact made: No, will contact:  family contact if pt consents Patient understands diagnosis: Yes. Discussing patient identified problems/goals with staff: Yes. Medical problems stabilized or resolved: Yes. Denies suicidal/homicidal ideation: No. Passive SI/Able to contract for safety on the unit.  Issues/concerns per patient self-inventory: No. Other: n/a   New problem(s) identified: No, Describe:  n/a  Discharge Plan or Barriers: CSW assessing for appropriate referrals.   Reason for Continuation of Hospitalization: Depression Medication stabilization Suicidal ideation  Estimated Length of Stay: 3-5 days   Attendees: Patient: 04/08/2016 2:29 PM  Physician: Dr. Sharolyn Douglas MD 04/08/2016 2:29 PM  Nursing: Jacqulyn Ducking RN 04/08/2016 2:29 PM  RN Care Manager: Lars Pinks CM 04/08/2016 2:29 PM  Social Worker: Maxie Better, LCSW 04/08/2016 2:29 PM  Recreational Therapist:  04/08/2016 2:29 PM  Other:  04/08/2016 2:29 PM  Other:  04/08/2016 2:29 PM  Other: 04/08/2016 2:29 PM    Scribe for Treatment Team: [place], LCSW 04/08/2016 2:29 PM

## 2016-04-08 NOTE — Plan of Care (Signed)
Problem: Activity: Goal: Imbalance in normal sleep/wake cycle will improve Outcome: Progressing Pt slept 6.75 hours last night according to flowsheet.    
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
## 2016-04-08 NOTE — Progress Notes (Signed)
Medication change and lab order note.  Patient reports depressive symptoms and has not been on medication for some time. She reports that she would like to return to medication. She has been diagnosed with a number of things in the past including schizophrenia bipolar disorder and depression. She relates some vague whispering or seeing shadows at times over the past for 5 months but denies any other symptoms such as paranoia or ideas of reference or delusions or audible auditory hallucinations. She describes that she know she is bipolar because her mood changes from minute to minute. She reports engaging in unsafe sex and would like to be tested for STDs.    Assessment/plan patient does not currently endorse symptoms consistent with either schizophrenia or bipolar disorder. She does appear to have some depression and she will be started on Remeron 15 mg by mouth daily at bedtime as the side effect of sleep might be helpful for her. The patient was briefed on the side effects,  expected effects, rationale, alternatives, dosing and agrees to a trial of Remeron at this time we will also order an RPR and HIV for tomorrow's draw.

## 2016-04-08 NOTE — Progress Notes (Signed)
D:Pt rates depression as a 5 and anxiety as a 2 on 0-10 scale with 10 being the most. Pt reports that she wants to go back to [facility] to be with a boyfriend and may stay in a shelter until she can get back. She says that she came to [place] to be with her father that has cancer. There was family conflict with her father and stepmother and she was made to leave.  A:Offered support, encouragement and 15 minute checks. R:Pt denies si and hi. Safety maintained on the unit.

## 2016-04-08 NOTE — Tx Team (Deleted)
Interdisciplinary Treatment and Diagnostic Plan Update  04/08/2016 Time of Session: 9:30AM Kathleen [name] MRN: [id number]  Principal Diagnosis: <principal problem not specified>  Secondary Diagnoses: Active Problems:   Bipolar affective disorder, current episode depressed (HCC)   Current Medications:  Current Facility-Administered Medications  Medication Dose Route Frequency Provider Last Rate Last Dose  . acetaminophen (TYLENOL) tablet 650 mg  650 mg Oral Q6H PRN Patrecia Pour, NP      . hydrOXYzine (ATARAX/VISTARIL) tablet 50 mg  50 mg Oral QHS PRN,MR X 1 Laverle Hobby, PA-C   50 mg at 04/07/16 2207  . Influenza vac split quadrivalent PF (FLUARIX) injection 0.5 mL  0.5 mL Intramuscular Tomorrow-1000 Fernando A Cobos, MD      . magnesium hydroxide (MILK OF MAGNESIA) suspension 30 mL  30 mL Oral Daily PRN Patrecia Pour, NP       PTA Medications: Prescriptions Prior to Admission  Medication Sig Dispense Refill Last Dose  . diphenhydrAMINE (SOMINEX) 25 MG tablet Take 100 mg by mouth daily as needed for allergies or sleep.    04/06/2016 at Unknown time  . hydrocortisone cream 1 % Apply 1 application topically 2 (two) times daily as needed for itching.   Past Month at Unknown time  . ibuprofen (ADVIL,MOTRIN) 200 MG tablet Take 200 mg by mouth every 6 (six) hours as needed for moderate pain.   04/06/2016 at Unknown time    Patient Stressors: Loss of best friend (recently passed) Marital or family conflict Medication change or noncompliance Other: housing (father will not let patient return)  Patient Strengths: Average or above average intelligence Communication skills General fund of knowledge Motivation for treatment/growth Physical Health Supportive family/friends Work skills  Treatment Modalities: Medication Management, Group therapy, Case management,  1 to 1 session with clinician, Psychoeducation, Recreational therapy.   Physician Treatment Plan for Primary  Diagnosis: <principal problem not specified> Long Term Goal(s):     Short Term Goals:    Medication Management: Evaluate patient's response, side effects, and tolerance of medication regimen.  Therapeutic Interventions: 1 to 1 sessions, Unit Group sessions and Medication administration.  Evaluation of Outcomes: Progressing  Physician Treatment Plan for Secondary Diagnosis: Active Problems:   Bipolar affective disorder, current episode depressed ([place])  Long Term Goal(s):     Short Term Goals:       Medication Management: Evaluate patient's response, side effects, and tolerance of medication regimen.  Therapeutic Interventions: 1 to 1 sessions, Unit Group sessions and Medication administration.  Evaluation of Outcomes: Progressing   RN Treatment Plan for Primary Diagnosis: <principal problem not specified> Long Term Goal(s): Knowledge of disease and therapeutic regimen to maintain health will improve  Short Term Goals: Ability to remain free from injury will improve, Ability to disclose and discuss suicidal ideas and Ability to identify and develop effective coping behaviors will improve  Medication Management: RN will administer medications as ordered by provider, will assess and evaluate patient's response and provide education to patient for prescribed medication. RN will report any adverse and/or side effects to prescribing provider.  Therapeutic Interventions: 1 on 1 counseling sessions, Psychoeducation, Medication administration, Evaluate responses to treatment, Monitor vital signs and CBGs as ordered, Perform/monitor CIWA, COWS, AIMS and Fall Risk screenings as ordered, Perform wound care treatments as ordered.  Evaluation of Outcomes: Progressing   LCSW Treatment Plan for Primary Diagnosis: <principal problem not specified> Long Term Goal(s): Safe transition to appropriate next level of care at discharge, Engage patient in therapeutic group addressing  interpersonal  concerns.  Short Term Goals: Engage patient in aftercare planning with referrals and resources, Identify triggers associated with mental health/substance abuse issues and Increase skills for wellness and recovery  Therapeutic Interventions: Assess for all discharge needs, 1 to 1 time with Social worker, Explore available resources and support systems, Assess for adequacy in community support network, Educate family and significant other(s) on suicide prevention, Complete Psychosocial Assessment, Interpersonal group therapy.  Evaluation of Outcomes: Progressing   Progress in Treatment: Attending groups: Yes. Participating in groups: Yes. Taking medication as prescribed: Yes. Toleration medication: Yes. Family/Significant other contact made: No, will contact:  with family if pt consents Patient understands diagnosis: Yes. Discussing patient identified problems/goals with staff: Yes. Medical problems stabilized or resolved: Yes. Denies suicidal/homicidal ideation: Yes. Issues/concerns per patient self-inventory: No. Other: n/a  New problem(s) identified: No, Describe:  n/a  Discharge Plan or Barriers: CSW assessing for appropriate referrals.   Reason for Continuation of Hospitalization: Depression Medication stabilization Withdrawal symptoms  Estimated Length of Stay: 3-5 days   Attendees: Patient: 04/08/2016 10:45 AM  Physician: Dr. Sharolyn Douglas MD 04/08/2016 10:45 AM  Nursing: Jacqulyn Ducking RN 04/08/2016 10:45 AM  RN Care Manager: Lars Pinks CM 04/08/2016 10:45 AM  Social Worker: Maxie Better, LCSW 04/08/2016 10:45 AM  Recreational Therapist:  04/08/2016 10:45 AM  Other:  04/08/2016 10:45 AM  Other:  04/08/2016 10:45 AM  Other: 04/08/2016 10:45 AM    Scribe for Treatment Team: Kimber Relic Smart, LCSW 04/08/2016 10:45 AM

## 2016-04-08 NOTE — Progress Notes (Signed)
D: Pt was in the day room upon initial approach.  Pt presents with depressed affect and mood.  Her goal is to "have a good night, good nights sleep too."  Pt denies SI/HI, denies hallucinations, denies pain.  Pt has been visible in milieu interacting with peers and staff appropriately.  Pt attended evening group.  Pt requested medication for sleep and states "Trazodone was making me have dreams that people are being killed."   A: Introduced self to pt.  Actively listened to pt and offered support and encouragement. On-site provider notified of pt's request for sleep medication.  Medication for sleep was ordered and administered. R: Pt is safe on the unit.  Pt is compliant with medication.  Pt verbally contracts for safety.  Will continue to monitor and assess.

## 2016-04-08 NOTE — BHH Suicide Risk Assessment (Signed)
[place] INPATIENT:  Family/Significant Other Suicide Prevention Education  Suicide Prevention Education:  Patient Refusal for Family/Significant Other Suicide Prevention Education: The patient Kathleen [name] has refused to provide written consent for family/significant other to be provided Family/Significant Other Suicide Prevention Education during admission and/or prior to discharge.  Physician notified.  SPE completed with pt, as pt refused to consent to family contact. SPI pamphlet provided to pt and pt was encouraged to share information with support network, ask questions, and talk about any concerns relating to SPE. Pt denies access to guns/firearms and verbalized understanding of information provided. Mobile Crisis information also provided to pt.   Kimber Relic Smart LCSW 04/08/2016, 3:44 PM

## 2016-04-08 NOTE — BHH Group Notes (Signed)
Patient attend group NA

## 2016-04-09 NOTE — Progress Notes (Signed)
Recreation Therapy Notes  Date: 04/09/16 Time: 0930 Location: [address] Dayroom  Group Topic: Stress Management  Goal Area(s) Addresses:  Patient will verbalize importance of using healthy stress management.  Patient will identify positive emotions associated with healthy stress management.   Behavioral Response: Engaged  Intervention: Stress Management  Activity :  Financial risk analyst.  LRT introduced the stress management technique of guided imagery.  LRT read script to engage patients in the technique.  Patients were to follow along as LRT read script.     Education:  Stress Management, Discharge Planning.   Education Outcome: Acknowledges edcuation/In group clarification offered/Needs additional education  Clinical Observations/Feedback:  Pt attended group.   Victorino Sparrow, LRT/CTRS         Victorino Sparrow A 04/09/2016 12:18 PM

## 2016-04-09 NOTE — Progress Notes (Signed)
D: Pt denies SI/HI/AVH. Pt is pleasant and cooperative. Pt stated she was doing ok for some time before coming in. PT was SI x 6 months and things got really bad 2 weeks before coming in due to her dad putting her out of the house. Pt plans to live in [place] with a prior BF from some time back that she said has always been a support to her.   A: Pt was offered support and encouragement. Pt was given scheduled medications. Pt was encourage to attend groups. Q 15 minute checks were done for safety.   R:Pt attends groups and interacts well with peers and staff. Pt is taking medication. Pt has no complaints.Pt receptive to treatment and safety maintained on unit.

## 2016-04-09 NOTE — BHH Group Notes (Signed)
[place] LCSW Group Therapy  04/09/2016 4:10 PM  Type of Therapy:  Group Therapy  Participation Level:  Did Not Attend-pt invited. Chose to remain in bed.   Summary of Progress/Problems: Today's Topic: Overcoming Obstacles. Patients identified one short term goal and potential obstacles in reaching this goal. Patients processed barriers involved in overcoming these obstacles. Patients identified steps necessary for overcoming these obstacles and explored motivation (internal and external) for facing these difficulties head on.   Alejos Reinhardt N Smart LCSW 04/09/2016, 4:10 PM

## 2016-04-09 NOTE — Progress Notes (Signed)
[facility] Progress Note  Patient Active Problem List   Diagnosis Date Noted  . Bipolar affective disorder, depressed, severe ([facility]) 04/07/2016  . Bipolar affective disorder, current episode depressed ([place]) 04/07/2016  . Cervicitis 01/14/2016      Diagnosis: Depression, suicidal ideation Subjective: Patient states that she is doing much better today. She denies sleep problems, LOA, irritability, mania symptoms, or mood instability. She is having no thoughts of hurting herself or others. No AVH. She states that she is happy today because she had the chance to speak with her ex-boyfriend in [place], who has told her that she can stay with him there. She is looking into catching a cheap bus to [place] as soon as she is discharged. Objective: WD/WN female in no acute distress who appears her stated age. Affect is appropriate and eye contact is good. Pt is well groomed and wearing casual clothes. Insight is good, judgement is good, and her thought processes are linear. A&O x 3.   Current Facility-Administered Medications:  .  acetaminophen (TYLENOL) tablet 650 mg, 650 mg, Oral, Q6H PRN, Patrecia Pour, NP .  hydrOXYzine (ATARAX/VISTARIL) tablet 50 mg, 50 mg, Oral, QHS PRN,MR X 1, Spencer E Simon, PA-C, 50 mg at 04/08/16 2121 .  magnesium hydroxide (MILK OF MAGNESIA) suspension 30 mL, 30 mL, Oral, Daily PRN, Patrecia Pour, NP .  mirtazapine (REMERON) tablet 15 mg, 15 mg, Oral, QHS, Linard Millers, MD, 15 mg at 04/08/16 2121   Vital Signs: Blood pressure 117/84, pulse (!) 108, temperature 98.1 F (36.7 C), temperature source Oral, resp. rate 18, height 5\' 9"  (1.753 m), weight 232 lb (105.2 kg), SpO2 100 %.   Physical Findings: No pertinent physical findings   Assessment/plan: Patient is tolerating Remeron trial well. Her condition is improved. She does not currently endorse symptoms consistent with either schizophrenia or bipolar disorder, and she denies current suicidal or homicidal  ideations. Will continue monitoring patient status- consider discharge tomorrow. RPR and HIV antibody have been ordered for today's lab draw.    I have supervised directly and have had face-to-face contact with patient today. Agree with plan as above.
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
## 2016-04-09 NOTE — BHH Suicide Risk Assessment (Signed)
[facility] Discharge Suicide Risk Assessment   Principal Problem: <principal problem not specified> Discharge Diagnoses:  Patient Active Problem List   Diagnosis Date Noted  . Bipolar affective disorder, depressed, severe ([place]) [F31.4] 04/07/2016  . Bipolar affective disorder, current episode depressed ([place]) [F31.30] 04/07/2016  . Cervicitis [N72] 01/14/2016    Total Time spent with patient: 15 minutes  Musculoskeletal: Strength & Muscle Tone: within normal limits Gait & Station: normal Patient leans: N/A  Psychiatric Specialty Exam: ROS  Blood pressure 117/84, pulse 97, temperature 98.1 F (36.7 C), temperature source Oral, resp. rate 18, height 5\' 9"  (1.753 m), weight 105.2 kg (232 lb), SpO2 100 %.Body mass index is 34.26 kg/m.  General Appearance: Casual  Eye Contact::  Fair  Speech:  Clear and Coherent409  Volume:  Normal  Mood:  Euthymic  Affect:  Congruent  Thought Process:  Coherent  Orientation:  Full (Time, Place, and Person)  Thought Content:  Negative  Suicidal Thoughts:  No  Homicidal Thoughts:  No  Memory:  Negative  Judgement:  Fair  Insight:  Present  Psychomotor Activity:  Normal  Concentration:  Good  Recall:  Good  Fund of Knowledge:Good  Language: Good  Akathisia:  No  Handed:  Right  AIMS (if indicated):     Assets:  Resilience  Sleep:  Number of Hours: 6.75  Cognition: WNL  ADL's:  Intact   Mental Status Per Nursing Assessment::   On Admission:  Suicidal ideation indicated by patient, Suicide plan, Plan includes specific time, place, or method, Self-harm thoughts, Intention to act on suicide plan  Demographic Factors:  Low socioeconomic status  Loss Factors: NA  Historical Factors: Prior suicide attempts  Risk Reduction Factors:   Sense of responsibility to family and Living with another person, especially a relative  Continued Clinical Symptoms:  More than one psychiatric diagnosis  Cognitive Features That Contribute To Risk:  None     Suicide Risk:  Mild:  Suicidal ideation of limited frequency, intensity, duration, and specificity.  There are no identifiable plans, no associated intent, mild dysphoria and related symptoms, good self-control (both objective and subjective assessment), few other risk factors, and identifiable protective factors, including available and accessible social support.  Follow-up Information    [facility] .   Specialty:  Behavioral Health Why:  Walk in between 8am-9am Monday through Friday for hospital follow-up/medication management/assessment for counseling services. Please go to [facility] within 2-3 days of discharge for follow-up. Thank you!  Contact information: [place] [address] [phone]           Plan Of Care/Follow-up recommendations:  Other:  Patient admitted after relationship stress caused her to experience homicidal and suicidal ideations. However she quickly reconstituted and was consistently denied any suicidal or homicidal ideation, plan or intent at present. She is pleased with the addition of Remeron and she should consider continuing this medication as an outpatient for mood symptoms.  Linard Millers, MD 04/09/2016, 3:34 PM

## 2016-04-10 LAB — RPR: RPR Ser Ql: NONREACTIVE

## 2016-04-10 LAB — HIV ANTIBODY (ROUTINE TESTING W REFLEX): HIV SCREEN 4TH GENERATION: NONREACTIVE

## 2016-04-10 MED ORDER — MIRTAZAPINE 15 MG PO TABS: 15.0000 mg | ORAL_TABLET | Freq: Every day | ORAL | 0 refills | Status: AC

## 2016-04-10 MED ORDER — HYDROXYZINE HCL 50 MG PO TABS: 50.0000 mg | ORAL_TABLET | Freq: Every evening | ORAL | 0 refills | Status: AC | PRN

## 2016-04-10 NOTE — BHH Group Notes (Signed)
[facility] Group Notes:  (Nursing/MHT/Case Management/Adjunct)  Date:  04/10/2016  Time:  12:39 PM  Type of Therapy:  Nurse Education  Participation Level:  Did Not Attend   Cheri Kearns 04/10/2016, 12:39 PM

## 2016-04-10 NOTE — Tx Team (Signed)
Interdisciplinary Treatment and Diagnostic Plan Update  04/10/2016 Time of Session: 9:30AM Taneya Hawa Henly MRN: [id number]  Principal Diagnosis: MDD  Secondary Diagnoses: Active Problems:   Bipolar affective disorder, current episode depressed (HCC)   Current Medications:  Current Facility-Administered Medications  Medication Dose Route Frequency Provider Last Rate Last Dose  . acetaminophen (TYLENOL) tablet 650 mg  650 mg Oral Q6H PRN Patrecia Pour, NP      . hydrOXYzine (ATARAX/VISTARIL) tablet 50 mg  50 mg Oral QHS PRN,MR X 1 Maurine Minister Simon, PA-C   50 mg at 04/09/16 2238  . magnesium hydroxide (MILK OF MAGNESIA) suspension 30 mL  30 mL Oral Daily PRN Patrecia Pour, NP      . mirtazapine (REMERON) tablet 15 mg  15 mg Oral QHS Linard Millers, MD   15 mg at 04/09/16 2149   PTA Medications: Prescriptions Prior to Admission  Medication Sig Dispense Refill Last Dose  . diphenhydrAMINE (SOMINEX) 25 MG tablet Take 100 mg by mouth daily as needed for allergies or sleep.    04/06/2016 at Unknown time  . hydrocortisone cream 1 % Apply 1 application topically 2 (two) times daily as needed for itching.   Past Month at Unknown time  . ibuprofen (ADVIL,MOTRIN) 200 MG tablet Take 200 mg by mouth every 6 (six) hours as needed for moderate pain.   04/06/2016 at Unknown time    Patient Stressors: Loss of best friend (recently passed) Marital or family conflict Medication change or noncompliance Other: housing (father will not let patient return)  Patient Strengths: Average or above average intelligence Communication skills General fund of knowledge Motivation for treatment/growth Physical Health Supportive family/friends Work skills  Treatment Modalities: Medication Management, Group therapy, Case management,  1 to 1 session with clinician, Psychoeducation, Recreational therapy.   Physician Treatment Plan for Primary Diagnosis: MDD Long Term Goal(s):     Short Term  Goals:    Medication Management: Evaluate patient's response, side effects, and tolerance of medication regimen.  Therapeutic Interventions: 1 to 1 sessions, Unit Group sessions and Medication administration.  Evaluation of Outcomes: Met  Physician Treatment Plan for Secondary Diagnosis: Active Problems:   Bipolar affective disorder, current episode depressed ([place])  Long Term Goal(s):     Short Term Goals:       Medication Management: Evaluate patient's response, side effects, and tolerance of medication regimen.  Therapeutic Interventions: 1 to 1 sessions, Unit Group sessions and Medication administration.  Evaluation of Outcomes: Met   RN Treatment Plan for Primary Diagnosis: MDD Long Term Goal(s): Knowledge of disease and therapeutic regimen to maintain health will improve  Short Term Goals: Ability to remain free from injury will improve, Ability to disclose and discuss suicidal ideas and Ability to identify and develop effective coping behaviors will improve  Medication Management: RN will administer medications as ordered by provider, will assess and evaluate patient's response and provide education to patient for prescribed medication. RN will report any adverse and/or side effects to prescribing provider.  Therapeutic Interventions: 1 on 1 counseling sessions, Psychoeducation, Medication administration, Evaluate responses to treatment, Monitor vital signs and CBGs as ordered, Perform/monitor CIWA, COWS, AIMS and Fall Risk screenings as ordered, Perform wound care treatments as ordered.  Evaluation of Outcomes: Met   LCSW Treatment Plan for Primary Diagnosis: MDD Long Term Goal(s): Safe transition to appropriate next level of care at discharge, Engage patient in therapeutic group addressing interpersonal concerns.  Short Term Goals: Engage patient in aftercare planning with referrals  and resources, Increase ability to appropriately verbalize feelings and Facilitate patient  progression through stages of change regarding substance use diagnoses and concerns  Therapeutic Interventions: Assess for all discharge needs, 1 to 1 time with Education officer, museum, Explore available resources and support systems, Assess for adequacy in community support network, Educate family and significant other(s) on suicide prevention, Complete Psychosocial Assessment, Interpersonal group therapy.  Evaluation of Outcomes: Met  Progress in Treatment: Attending groups: Yes. Participating in groups: Yes Taking medication as prescribed: Yes. Toleration medication: Yes. Family/Significant other contact made: SPE completed with pt; pt declined to consent to family contact.  Patient understands diagnosis: Yes. Discussing patient identified problems/goals with staff: Yes. Medical problems stabilized or resolved: Yes. Denies suicidal/homicidal ideation: yes, self report Issues/concerns per patient self-inventory: No. Other: n/a   New problem(s) identified: No, Describe:  n/a  Discharge Plan or Barriers: Pt [facility]. Pt plans to move to [place], [place] with her boyfriend in the near future. She was provided with mental health clinic information for the [place], [place] area and other mental health resources in that area.   Reason for Continuation of Hospitalization: none  Estimated Length of Stay: d/c today   Attendees: Patient: 04/10/2016 10:24 AM  Physician: Dr. Sharolyn Douglas MD 04/10/2016 10:24 AM  Nursing: Mindi Junker RN 04/10/2016 10:24 AM  RN Care Manager: Lars Pinks CM 04/10/2016 10:24 AM  Social Worker: Press photographer, LCSW 04/10/2016 10:24 AM  Recreational Therapist:  04/10/2016 10:24 AM  Other:  04/10/2016 10:24 AM  Other:  04/10/2016 10:24 AM  Other: 04/10/2016 10:24 AM    Scribe for Treatment Team: Kimber Relic Smart, LCSW 04/10/2016 10:24 AM

## 2016-04-10 NOTE — Plan of Care (Signed)
Problem: Coping: Goal: Ability to cope will improve Outcome: Progressing Pt denies SI at this time   

## 2016-04-10 NOTE — Progress Notes (Signed)
Data. Patient denies SI/HI/AVH.  Patient interacting well with staff and other patients. She reports feeling excited and nervous about her discharge today and her move to [place]. She states she will be, "Leaving to go down there today".  Affect is bright on approach, biut otherwise flat. On her self assessment patient reports, 0/10 for anxiety, depression and hopelessness. Her goal for today is: "Going to [place] today".   Action. Emotional support and encouragement offered. Education provided on medication, indications and side effect. Q 15 minute checks done for safety. Response. Safety on the unit maintained through 15 minute checks.  Medications taken as prescribed. Attended groups. Remained calm and appropriate through out shift.  Pt. discharged to lobby.  Belongings sheet reviewed and signed by pt. and all belongings, including a large blue, rolling suitcase, a large black, rolling, suitcase, medication samples and scripts,  sent home. Paperwork reviewed and pt. able to verbalize understanding of education. Pt. in no current distress and ambulatory.
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
## 2016-04-10 NOTE — Progress Notes (Signed)
  [facility] Adult Case Management Discharge Plan :  Will you be returning to the same living situation after discharge:  No. Pt plans to move to [place] with boyfriend.  At discharge, do you have transportation home?: Yes,  bus pass in chart Do you have the ability to pay for your medications: Yes,  mental health  Release of information consent forms completed and submitted to medical records by CSW.  Patient to Follow up at: Follow-up Information    [facility] .   Specialty:  Behavioral Health Why:  Walk in between 8am-9am Monday through Friday for hospital follow-up/medication management/assessment for counseling services. Please go to [facility] within 2-3 days of discharge for follow-up. Thank you!  Contact information: [address] [place] [address] [phone]         Pt may be moving to [place] with her boyfriend in the near future--information provided for behavioral health services in the [place], [place] area.   Next level of care provider has access to [place] and Suicide Prevention discussed: Yes,  SPE completed with pt; pt declines to consent to family contact .SPI pamphlet and Mobile Crisis information provided to pt.   Have you used any form of tobacco in the last 30 days? (Cigarettes, Smokeless Tobacco, Cigars, and/or Pipes): No  Has patient been referred to the Quitline?: Patient refused referral  Patient has been referred for addiction treatment: Yes  Horton Ellithorpe N Smart LCSW 04/10/2016, 10:22 AM

## 2016-04-23 ENCOUNTER — Ambulatory Visit: Payer: Self-pay | Admitting: Obstetrics & Gynecology

## 2016-05-29 ENCOUNTER — Emergency Department (HOSPITAL_COMMUNITY)
Admission: EM | Admit: 2016-05-29 | Discharge: 2016-05-29 | Disposition: A | Discharge status: Home / Self Care | Payer: Medicaid Other | Attending: Emergency Medicine | Admitting: Emergency Medicine

## 2016-05-29 ENCOUNTER — Encounter (HOSPITAL_COMMUNITY): Payer: Self-pay

## 2016-05-29 DIAGNOSIS — I10 Essential (primary) hypertension: Secondary | ICD-10-CM | POA: Insufficient documentation

## 2016-05-29 DIAGNOSIS — R202 Paresthesia of skin: Secondary | ICD-10-CM | POA: Insufficient documentation

## 2016-05-29 DIAGNOSIS — Z5321 Procedure and treatment not carried out due to patient leaving prior to being seen by health care provider: Secondary | ICD-10-CM | POA: Insufficient documentation

## 2016-05-29 LAB — URINALYSIS, ROUTINE W REFLEX MICROSCOPIC
BACTERIA UA: NONE SEEN
BILIRUBIN URINE: NEGATIVE
Glucose, UA: NEGATIVE mg/dL
Ketones, ur: NEGATIVE mg/dL
LEUKOCYTES UA: NEGATIVE
NITRITE: NEGATIVE
PROTEIN: 30 mg/dL — AB
Specific Gravity, Urine: 1.026 (ref 1.005–1.030)
pH: 6 (ref 5.0–8.0)

## 2016-05-29 LAB — CBG MONITORING, ED: GLUCOSE-CAPILLARY: 100 mg/dL — AB (ref 65–99)

## 2016-05-29 NOTE — ED Notes (Signed)
Called for patient again with no response.  

## 2016-05-29 NOTE — ED Notes (Signed)
Called patient 3x, no response. 

## 2016-05-29 NOTE — ED Triage Notes (Signed)
Pt reports burning bilaterally in her feet intermittently for months. She also reports tingling in her vaginal area and would like an STD check. Hx of borderline diabetes.

## 2016-08-07 ENCOUNTER — Emergency Department (HOSPITAL_COMMUNITY)
Admission: EM | Admit: 2016-08-07 | Discharge: 2016-08-07 | Disposition: A | Discharge status: Home / Self Care | Payer: Medicaid Other | Attending: Emergency Medicine | Admitting: Emergency Medicine

## 2016-08-07 ENCOUNTER — Encounter (HOSPITAL_COMMUNITY): Payer: Self-pay

## 2016-08-07 DIAGNOSIS — N72 Inflammatory disease of cervix uteri: Secondary | ICD-10-CM | POA: Insufficient documentation

## 2016-08-07 LAB — URINALYSIS, ROUTINE W REFLEX MICROSCOPIC
Bilirubin Urine: NEGATIVE
GLUCOSE, UA: NEGATIVE mg/dL
Hgb urine dipstick: NEGATIVE
Ketones, ur: NEGATIVE mg/dL
Leukocytes, UA: NEGATIVE
Nitrite: NEGATIVE
Protein, ur: NEGATIVE mg/dL
SPECIFIC GRAVITY, URINE: 1.012 (ref 1.005–1.030)
pH: 7 (ref 5.0–8.0)

## 2016-08-07 LAB — WET PREP, GENITAL
CLUE CELLS WET PREP: NONE SEEN
SPERM: NONE SEEN
Trich, Wet Prep: NONE SEEN
Yeast Wet Prep HPF POC: NONE SEEN

## 2016-08-07 LAB — PREGNANCY, URINE: Preg Test, Ur: NEGATIVE

## 2016-08-07 MED ORDER — CEFTRIAXONE SODIUM 250 MG IJ SOLR
250.0000 mg | Freq: Once | INTRAMUSCULAR | Status: AC
  Administered 2016-08-07: 250 mg via INTRAMUSCULAR
  Filled 2016-08-07: qty 250

## 2016-08-07 MED ORDER — AZITHROMYCIN 250 MG PO TABS
1000.0000 mg | ORAL_TABLET | Freq: Once | ORAL | Status: AC
  Administered 2016-08-07: 1000 mg via ORAL
  Filled 2016-08-07: qty 4

## 2016-08-07 NOTE — ED Triage Notes (Signed)
Per Pt, Pt is coming from home with complaints of brown vaginal discharge and rectal pressure for three days. No foul odor noted.

## 2016-08-07 NOTE — ED Provider Notes (Signed)
Patient sign out received from Dr. Gilford Raid.  Patient is waiting for completion of urinalysis/wet prep.  9:21 PM Labs reviewed. No indication of UTI or BV.  Patient has already received rocephin and zithromax for STD.  Patient discharged home with return precautions provided.     Etta Quill, NP 08/07/16 2126    Isla Pence, MD 08/09/16 [phone]

## 2016-08-07 NOTE — ED Provider Notes (Signed)
[facility] DEPT Provider Note   CSN: [id number] Arrival date & time: 08/07/16  1853     History   Chief Complaint Chief Complaint  Patient presents with  . Vaginal Discharge    HPI Kathleen [name] is a 26 y.o. female.  Pt presents to the ED today with vaginal discharge and rectal pressure.  It's been going on for 3 days.  Pt said that she has irregular periods.  She also said she's been having constipation and has been straining.      Past Medical History:  Diagnosis Date  . Bipolar 1 disorder ([place])   . Depression   . Headache(784.0)   . Hypertension   . Schizophrenia ([place])   . STD (female)     Patient Active Problem List   Diagnosis Date Noted  . Bipolar affective disorder, depressed, severe ([place]) 04/07/2016  . Bipolar affective disorder, current episode depressed ([place]) 04/07/2016  . Cervicitis 01/14/2016    Past Surgical History:  Procedure Laterality Date  . BUNIONECTOMY     both feet    OB History    No data available       Home Medications    Prior to Admission medications   Medication Sig Start Date End Date Taking? Authorizing Provider  hydrOXYzine (ATARAX/VISTARIL) 50 MG tablet Take 1 tablet (50 mg total) by mouth at bedtime as needed (sleep). 04/10/16   Benjamine Mola, FNP  mirtazapine (REMERON) 15 MG tablet Take 1 tablet (15 mg total) by mouth at bedtime. 04/10/16   Benjamine Mola, FNP    Family History Family History  Problem Relation Age of Onset  . Diabetes Mother   . Hypertension Mother     Social History Social History  Substance Use Topics  . Smoking status: Never Smoker  . Smokeless tobacco: Never Used  . Alcohol use No     Allergies   Other   Review of Systems Review of Systems  Gastrointestinal: Positive for constipation and rectal pain.  Genitourinary: Positive for vaginal discharge.  All other systems reviewed and are negative.    Physical Exam Updated Vital Signs BP 125/89 (BP Location: Left Arm)    Pulse 101   Temp 99.6 F (37.6 C) (Oral)   Resp 20   Ht 5\' 8"  (1.727 m)   Wt 231 lb (104.8 kg)   SpO2 99%   BMI 35.12 kg/m   Physical Exam  Constitutional: She is oriented to person, place, and time. She appears well-developed and well-nourished.  HENT:  Head: Normocephalic and atraumatic.  Right Ear: External ear normal.  Left Ear: External ear normal.  Nose: Nose normal.  Mouth/Throat: Oropharynx is clear and moist.  Eyes: Conjunctivae and EOM are normal. Pupils are equal, round, and reactive to light.  Neck: Normal range of motion. Neck supple.  Cardiovascular: Normal rate, regular rhythm, normal heart sounds and intact distal pulses.   Pulmonary/Chest: Effort normal and breath sounds normal.  Abdominal: Soft. Bowel sounds are normal.  Genitourinary: Rectum normal and vagina normal. Cervix exhibits no discharge. Right adnexum displays no tenderness. Left adnexum displays no tenderness.  Musculoskeletal: Normal range of motion.  Neurological: She is alert and oriented to person, place, and time.  Skin: Skin is warm and dry.  Psychiatric: She has a normal mood and affect. Her behavior is normal. Judgment and thought content normal.  Nursing note and vitals reviewed.    ED Treatments / Results  Labs (all labs ordered are listed, but only abnormal results are  displayed) Labs Reviewed  WET PREP, GENITAL  URINALYSIS, ROUTINE W REFLEX MICROSCOPIC  PREGNANCY, URINE  HIV ANTIBODY (ROUTINE TESTING)  GC/CHLAMYDIA PROBE AMP ([place]) NOT AT [facility]    EKG  EKG Interpretation None       Radiology No results found.  Procedures Procedures (including critical care time)  Medications Ordered in ED Medications  cefTRIAXone (ROCEPHIN) injection 250 mg (not administered)  azithromycin (ZITHROMAX) tablet 1,000 mg (not administered)     Initial Impression / Assessment and Plan / ED Course  I have reviewed the triage vital signs and the nursing notes.  Pertinent labs  & imaging results that were available during my care of the patient were reviewed by me and considered in my medical decision making (see chart for details).  Pt requests treatment for STD, so she will be given rocephin and zithromax.  She also requests a HIV test.    Labs and vaginal swabs pending at shift change.  Pt to be followed by Etta Quill, NP.  Final Clinical Impressions(s) / ED Diagnoses   Final diagnoses:  Cervicitis    New Prescriptions New Prescriptions   No medications on file     Isla Pence, MD 08/09/16 [phone]

## 2016-08-08 LAB — GC/CHLAMYDIA PROBE AMP (~~LOC~~) NOT AT ARMC
Chlamydia: NEGATIVE
NEISSERIA GONORRHEA: NEGATIVE

## 2016-08-08 LAB — HIV ANTIBODY (ROUTINE TESTING W REFLEX): HIV Screen 4th Generation wRfx: NONREACTIVE

## 2016-09-04 ENCOUNTER — Ambulatory Visit: Payer: Self-pay | Admitting: Obstetrics & Gynecology
